# Patient Record
Sex: Female | Born: 1965 | Race: Black or African American | Hispanic: No | Smoking: Never smoker
Health system: Southern US, Community
[De-identification: ages and names within clinical notes are randomized; demographics above are authoritative.]

## PROBLEM LIST (undated history)

## (undated) DIAGNOSIS — M549 Dorsalgia, unspecified: Secondary | ICD-10-CM

## (undated) DIAGNOSIS — I10 Essential (primary) hypertension: Secondary | ICD-10-CM

## (undated) DIAGNOSIS — M25569 Pain in unspecified knee: Secondary | ICD-10-CM

## (undated) HISTORY — PX: ECTOPIC PREGNANCY SURGERY: SHX613

---

## 2008-11-22 ENCOUNTER — Emergency Department (HOSPITAL_COMMUNITY): Admission: EM | Admit: 2008-11-22 | Discharge: 2008-11-23 | Payer: Self-pay | Admitting: Emergency Medicine

## 2009-01-22 ENCOUNTER — Emergency Department (HOSPITAL_COMMUNITY): Admission: EM | Admit: 2009-01-22 | Discharge: 2009-01-23 | Payer: Self-pay | Admitting: Emergency Medicine

## 2009-03-12 ENCOUNTER — Emergency Department (HOSPITAL_COMMUNITY): Admission: EM | Admit: 2009-03-12 | Discharge: 2009-03-12 | Payer: Self-pay | Admitting: Emergency Medicine

## 2009-06-17 ENCOUNTER — Emergency Department (HOSPITAL_COMMUNITY): Admission: EM | Admit: 2009-06-17 | Discharge: 2009-06-17 | Payer: Self-pay | Admitting: Emergency Medicine

## 2011-07-17 ENCOUNTER — Emergency Department (HOSPITAL_COMMUNITY)
Admission: EM | Admit: 2011-07-17 | Discharge: 2011-07-17 | Disposition: A | Discharge status: Home / Self Care | Payer: Self-pay | Attending: Emergency Medicine | Admitting: Emergency Medicine

## 2011-07-17 DIAGNOSIS — R220 Localized swelling, mass and lump, head: Secondary | ICD-10-CM | POA: Insufficient documentation

## 2011-07-17 DIAGNOSIS — R221 Localized swelling, mass and lump, neck: Secondary | ICD-10-CM | POA: Insufficient documentation

## 2011-07-17 DIAGNOSIS — K047 Periapical abscess without sinus: Secondary | ICD-10-CM | POA: Insufficient documentation

## 2011-07-17 DIAGNOSIS — R6884 Jaw pain: Secondary | ICD-10-CM | POA: Insufficient documentation

## 2011-07-17 DIAGNOSIS — N760 Acute vaginitis: Secondary | ICD-10-CM | POA: Insufficient documentation

## 2011-07-17 DIAGNOSIS — I10 Essential (primary) hypertension: Secondary | ICD-10-CM | POA: Insufficient documentation

## 2011-07-17 DIAGNOSIS — A499 Bacterial infection, unspecified: Secondary | ICD-10-CM | POA: Insufficient documentation

## 2011-07-17 DIAGNOSIS — K029 Dental caries, unspecified: Secondary | ICD-10-CM | POA: Insufficient documentation

## 2011-07-17 DIAGNOSIS — B9689 Other specified bacterial agents as the cause of diseases classified elsewhere: Secondary | ICD-10-CM | POA: Insufficient documentation

## 2011-07-17 LAB — WET PREP, GENITAL

## 2011-07-18 LAB — GC/CHLAMYDIA PROBE AMP, GENITAL
Chlamydia, DNA Probe: NEGATIVE
GC Probe Amp, Genital: NEGATIVE

## 2012-05-05 ENCOUNTER — Encounter (HOSPITAL_COMMUNITY): Payer: Self-pay | Admitting: Emergency Medicine

## 2012-05-05 ENCOUNTER — Emergency Department (HOSPITAL_COMMUNITY)
Admission: EM | Admit: 2012-05-05 | Discharge: 2012-05-05 | Disposition: A | Discharge status: Home / Self Care | Payer: Federal, State, Local not specified - PPO | Attending: Emergency Medicine | Admitting: Emergency Medicine

## 2012-05-05 DIAGNOSIS — H5789 Other specified disorders of eye and adnexa: Secondary | ICD-10-CM | POA: Insufficient documentation

## 2012-05-05 DIAGNOSIS — H04209 Unspecified epiphora, unspecified lacrimal gland: Secondary | ICD-10-CM

## 2012-05-05 DIAGNOSIS — I10 Essential (primary) hypertension: Secondary | ICD-10-CM

## 2012-05-05 MED ORDER — TOBRAMYCIN 0.3 % OP SOLN
2.0000 [drp] | OPHTHALMIC | Status: DC
  Administered 2012-05-05: 2 [drp] via OPHTHALMIC
  Filled 2012-05-05: qty 5

## 2012-05-05 NOTE — ED Notes (Signed)
Pt c/o eye drainage and crust when she wakes in the AM. With drainage continuing throughout the day.

## 2012-05-05 NOTE — ED Provider Notes (Addendum)
History     CSN: [id number]  Arrival date & time 05/05/12  3875   First MD Initiated Contact with Patient 05/05/12 [phone]      Chief Complaint  Patient presents with  . Eye Drainage    (Consider location/radiation/quality/duration/timing/severity/associated sxs/prior treatment) HPI Comments: Pt reports issue with eyes for several days to weeks, seems to get a little worse each day.  Primary symptoms are itching to both eyes, worse on left at medial corner.  Is a little red in AM's, but pt cannot tell me if it gets better through the day or not.  She denies fever, runny nose, sinus pressure, HA, visual change.  She admits tearing a little more at times and generally in AM there is crust to medial portion of eye.  She reports trying an OTC allergy eye drop with no relief.  She tried a benadryl with no relief.  She has had allergies in the past, but not every year and has not taken any allegra or claritin or other allergy meds.  No sick contacts, no trauma.  Pt reports years ago, she felt similar, can't recall if she had red eyes or not, she tells me she has had pink eye and doesn't have those symptoms, however was given some kind of eye drops, doesn't recall exactly but names tobradex or tobramycin by name as possibilities and thinks that helped her.    The history is provided by the patient.    History reviewed. No pertinent past medical history.  History reviewed. No pertinent past surgical history.  History reviewed. No pertinent family history.  History  Substance Use Topics  . Smoking status: Not on file  . Smokeless tobacco: Not on file  . Alcohol Use: Not on file    OB History    Grav Para Term Preterm Abortions TAB SAB Ect Mult Living                  Review of Systems  Constitutional: Negative for fever and chills.  HENT: Negative for congestion and sinus pressure.   Eyes: Positive for discharge, redness and itching. Negative for photophobia, pain and visual  disturbance.  Neurological: Negative for headaches.    Allergies  Biaxin and Iodine  Home Medications   Current Outpatient Rx  Name Route Sig Dispense Refill  . CLONIDINE HCL 0.1 MG PO TABS Oral Take 0.1 mg by mouth 2 (two) times daily.    Marland Kitchen LISINOPRIL 40 MG PO TABS Oral Take 40 mg by mouth daily.      BP 200/105  Pulse 78  Temp 98.3 F (36.8 C) (Oral)  Resp 18  Ht 5\' 7"  (1.702 m)  SpO2 98%  Physical Exam  Nursing note and vitals reviewed. Constitutional: She appears well-developed.  HENT:  Head: Normocephalic and atraumatic.  Eyes: Conjunctivae and EOM are normal. Pupils are equal, round, and reactive to light. Right eye exhibits no chemosis, no exudate and no hordeolum. No foreign body present in the right eye. Left eye exhibits discharge. Left eye exhibits no chemosis, no exudate and no hordeolum. No foreign body present in the left eye. No scleral icterus.      ED Course  Procedures (including critical care time)  Labs Reviewed - No data to display No results found.   1. Eye tearing       MDM  I suspect possibly allergies, no URI symptoms.  No HA, change in vision, redness, dischargeOtherwise possibly clogged nasolacrimal ducts.  As I was discussing  possibility of needing ducts drained by ophthalmologist, pt became defensive and stated "I didn't come all the way to the emergency department to be told I need to follow up with an eye doctor."  I re-iterated and pointed out that she herself stated she didn't have symptoms of "pink eye" with redness, discharge, photophobia.   pt seems insistent  that her expectations are for some kind of unknown eye drops and not following up with an ophthalmologist.  Regardless, she will be referred to one and she will try tobramycin drops. She does not wish to pursue continuing OTC allergy drops that I recommend because they didn't work before.  I also suggested trying claritin or zyrtec, however she didn't answer me to this  recommendation.  She did curtly respond that she would try the eye drops and acknowledged that I would be referring her to an optho and she would likely need follow up since I didn't think abx eye drops would resolve this.  She is told that steroid drops are not given emergently without opthalmology request and no need for emergent consultation is indicated at this time.     Her BP is quite high, she takes clonidine and lisinopril normally.  She has no complaint of blurred vision, HA, CP, SOB, back pain and see no symptoms or signs of end organ failure from HTN.           Gavin Pound. Oletta Lamas, MD 05/05/12 9604  Gavin Pound. Oletta Lamas, MD 05/05/12 5409  Gavin Pound. Antoria Lanza, MD 05/05/12 1001

## 2012-05-27 ENCOUNTER — Emergency Department (HOSPITAL_COMMUNITY)
Admission: EM | Admit: 2012-05-27 | Discharge: 2012-05-27 | Disposition: A | Discharge status: Home / Self Care | Payer: Federal, State, Local not specified - PPO | Attending: Emergency Medicine | Admitting: Emergency Medicine

## 2012-05-27 ENCOUNTER — Emergency Department (HOSPITAL_COMMUNITY): Payer: Federal, State, Local not specified - PPO

## 2012-05-27 ENCOUNTER — Encounter (HOSPITAL_COMMUNITY): Payer: Self-pay

## 2012-05-27 DIAGNOSIS — M5430 Sciatica, unspecified side: Secondary | ICD-10-CM | POA: Insufficient documentation

## 2012-05-27 DIAGNOSIS — I10 Essential (primary) hypertension: Secondary | ICD-10-CM | POA: Insufficient documentation

## 2012-05-27 DIAGNOSIS — IMO0002 Reserved for concepts with insufficient information to code with codable children: Secondary | ICD-10-CM | POA: Insufficient documentation

## 2012-05-27 DIAGNOSIS — M1710 Unilateral primary osteoarthritis, unspecified knee: Secondary | ICD-10-CM | POA: Insufficient documentation

## 2012-05-27 HISTORY — DX: Essential (primary) hypertension: I10

## 2012-05-27 HISTORY — DX: Dorsalgia, unspecified: M54.9

## 2012-05-27 MED ORDER — IBUPROFEN 600 MG PO TABS: 600.0000 mg | ORAL_TABLET | Freq: Four times a day (QID) | ORAL | Status: DC | PRN

## 2012-05-27 MED ORDER — HYDROMORPHONE HCL PF 1 MG/ML IJ SOLN
2.0000 mg | Freq: Once | INTRAMUSCULAR | Status: DC
  Filled 2012-05-27 (×2): qty 1

## 2012-05-27 MED ORDER — CYCLOBENZAPRINE HCL 5 MG PO TABS: 5.0000 mg | ORAL_TABLET | Freq: Three times a day (TID) | ORAL | Status: DC | PRN

## 2012-05-27 MED ORDER — OXYCODONE-ACETAMINOPHEN 5-325 MG PO TABS: 1.0000 | ORAL_TABLET | Freq: Four times a day (QID) | ORAL | Status: DC | PRN

## 2012-05-27 MED ORDER — DIAZEPAM 5 MG PO TABS
10.0000 mg | ORAL_TABLET | Freq: Once | ORAL | Status: AC
  Administered 2012-05-27: 10 mg via ORAL
  Filled 2012-05-27: qty 2
  Filled 2012-05-27: qty 1

## 2012-05-27 NOTE — ED Provider Notes (Signed)
History     CSN: [id number]  Arrival date & time 05/27/12  1152   First MD Initiated Contact with Patient 05/27/12 1303      Chief Complaint  Patient presents with  . Back Pain    (Consider location/radiation/quality/duration/timing/severity/associated sxs/prior treatment) HPI Pt with hx of sciatica presents with c/o pain in right lower back with raidation into right leg.  She states she was doing housework and the pain began yesterday.  Pain is aching and described as moderate.  No weakness of legs, no urinary retention, no incontinence of bowel or bladder.  She also states that she is having left knee pain.  States she has some amount of chronic left knee pain but this worsened yesterday as well when she was standing up from a chair.  States she has been told in the past that she has a torn meniscus.  Pain worse with movement and palpation. She has not tried anything at home for her pain other than ibuprofen earlier today.  There are no other associated systemic symptoms, there are no other alleviating or modifying factors.   Past Medical History  Diagnosis Date  . Back pain   . Hypertension     History reviewed. No pertinent past surgical history.  No family history on file.  History  Substance Use Topics  . Smoking status: Never Smoker   . Smokeless tobacco: Not on file  . Alcohol Use: No    OB History    Grav Para Term Preterm Abortions TAB SAB Ect Mult Living                  Review of Systems ROS reviewed and all otherwise negative except for mentioned in HPI  Allergies  Biaxin and Iodine  Home Medications   Current Outpatient Rx  Name Route Sig Dispense Refill  . CLONIDINE HCL 0.1 MG PO TABS Oral Take 0.1 mg by mouth 2 (two) times daily.    Marland Kitchen LISINOPRIL 40 MG PO TABS Oral Take 40 mg by mouth daily.    . CYCLOBENZAPRINE HCL 5 MG PO TABS Oral Take 1 tablet (5 mg total) by mouth 3 (three) times daily as needed for muscle spasms. 20 tablet 0  . IBUPROFEN 600  MG PO TABS Oral Take 1 tablet (600 mg total) by mouth every 6 (six) hours as needed for pain. 30 tablet 0  . OXYCODONE-ACETAMINOPHEN 5-325 MG PO TABS Oral Take 1-2 tablets by mouth every 6 (six) hours as needed for pain. 15 tablet 0    BP 163/88  Pulse 69  Temp 98.3 F (36.8 C) (Oral)  Resp 14  Ht 5\' 6"  (1.676 m)  Wt 301 lb (136.533 kg)  BMI 48.58 kg/m2  SpO2 100%  LMP 04/27/2012 Vitals reviewed Physical Exam Physical Examination: General appearance - alert, well appearing, and in no distress Mental status - alert, oriented to person, place, and time Mouth - mucous membranes moist, pharynx normal without lesions Chest - clear to auscultation, no wheezes, rales or rhonchi, symmetric air entry Heart - normal rate, regular rhythm, normal S1, S2, no murmurs, rubs, clicks or gallops Abdomen - soft, nontender, nondistended, no masses or organomegaly Back exam - full range of motion, no tenderness, palpable spasm or pain on motion Neurological - alert, oriented, normal speech, strength 5/5 in extremities x 4, sensation intact Musculoskeletal - some pain with ROM of left knee- no bony point tenderness, no medial or lateral joint line tenderness, otherwise  no joint tenderness, deformity  or swelling Extremities - peripheral pulses normal, no pedal edema, no clubbing or cyanosis Skin - normal coloration and turgor, no rashes Psych- normal mood and affect  ED Course  Procedures (including critical care time)  Labs Reviewed - No data to display Dg Lumbar Spine Complete  05/27/2012  *RADIOLOGY REPORT*  Clinical Data: Low back and left knee pain.  No known injury.  LUMBAR SPINE - COMPLETE 4+ VIEW  Comparison: 11/22/2008 and MR dated 06/17/2009.  Findings: Five non-rib bearing lumbar vertebrae.  Mild facet degenerative changes at the L5-S1 level.  Mild anterior spur formation at the T12-L1 level.  No fractures, pars defects or subluxations.  IMPRESSION: Mild degenerative changes.  Original Report  Authenticated By: Darrol Angel, M.D.   Dg Knee Complete 4 Views Left  05/27/2012  *RADIOLOGY REPORT*  Clinical Data: Left knee pain, no known injury  LEFT KNEE - COMPLETE 4+ VIEW  Comparison: None.  Findings:  No fracture or dislocation.  There is mild degenerative change of the medial compartment with joint space loss, subchondral sclerosis and osteophytosis.  There is minimal spurring of the tibial spines. No suprapatellar joint effusion.  No evidence of chondrocalcinosis.  IMPRESSION: Mild degenerative change of the medial compartment of the knee.  Original Report Authenticated By: Waynard Reeds, M.D.     1. Sciatica   2. Osteoarthritis of knee       MDM  Pt with low back pain c/w sciatica- no signs or symptoms of cauda equina.  Also left knee pain.  Xrays reassuring.  Pt treated with pain medications and muscle relaxers in the ED, she did have some improvement in her symptoms.  Discharged with strict return precautions.  Pt agreeable with plan.        Ethelda Chick, MD 05/28/12 [phone]

## 2012-05-27 NOTE — ED Notes (Signed)
Pt. Refused dilaudid injection.  MD notified.

## 2012-05-27 NOTE — ED Notes (Signed)
Pt states ran out of BP meds last taken was several weeks ago

## 2012-05-27 NOTE — ED Notes (Signed)
Pt in from home with lower back pain radiating to the right leg hx of sciatica states onset yesterday worsening today denies recent injury

## 2012-06-03 ENCOUNTER — Emergency Department (HOSPITAL_COMMUNITY)
Admission: EM | Admit: 2012-06-03 | Discharge: 2012-06-03 | Disposition: A | Discharge status: Home / Self Care | Payer: Federal, State, Local not specified - PPO | Attending: Emergency Medicine | Admitting: Emergency Medicine

## 2012-06-03 ENCOUNTER — Encounter (HOSPITAL_COMMUNITY): Payer: Self-pay | Admitting: *Deleted

## 2012-06-03 DIAGNOSIS — I10 Essential (primary) hypertension: Secondary | ICD-10-CM | POA: Insufficient documentation

## 2012-06-03 DIAGNOSIS — M25569 Pain in unspecified knee: Secondary | ICD-10-CM | POA: Insufficient documentation

## 2012-06-03 DIAGNOSIS — Z79899 Other long term (current) drug therapy: Secondary | ICD-10-CM | POA: Insufficient documentation

## 2012-06-03 DIAGNOSIS — M25529 Pain in unspecified elbow: Secondary | ICD-10-CM | POA: Insufficient documentation

## 2012-06-03 DIAGNOSIS — I160 Hypertensive urgency: Secondary | ICD-10-CM

## 2012-06-03 DIAGNOSIS — R209 Unspecified disturbances of skin sensation: Secondary | ICD-10-CM | POA: Insufficient documentation

## 2012-06-03 HISTORY — DX: Pain in unspecified knee: M25.569

## 2012-06-03 LAB — CBC WITH DIFFERENTIAL/PLATELET
Basophils Relative: 0 % (ref 0–1)
Eosinophils Absolute: 0.2 10*3/uL (ref 0.0–0.7)
Hemoglobin: 12.2 g/dL (ref 12.0–15.0)
MCH: 29 pg (ref 26.0–34.0)
MCHC: 32.5 g/dL (ref 30.0–36.0)
Monocytes Relative: 6 % (ref 3–12)
Neutrophils Relative %: 51 % (ref 43–77)

## 2012-06-03 LAB — BASIC METABOLIC PANEL
BUN: 14 mg/dL (ref 6–23)
Calcium: 9 mg/dL (ref 8.4–10.5)
Creatinine, Ser: 1 mg/dL (ref 0.50–1.10)
GFR calc non Af Amer: 67 mL/min — ABNORMAL LOW (ref >90)
Glucose, Bld: 77 mg/dL (ref 70–99)
Potassium: 3.4 mEq/L — ABNORMAL LOW (ref 3.5–5.1)

## 2012-06-03 LAB — URINALYSIS, ROUTINE W REFLEX MICROSCOPIC
Bilirubin Urine: NEGATIVE
Protein, ur: NEGATIVE mg/dL
Urobilinogen, UA: 1 mg/dL (ref 0.0–1.0)

## 2012-06-03 LAB — GLUCOSE, CAPILLARY: Glucose-Capillary: 99 mg/dL (ref 70–99)

## 2012-06-03 LAB — URINE MICROSCOPIC-ADD ON

## 2012-06-03 MED ORDER — CLONIDINE HCL 0.1 MG PO TABS: 0.1000 mg | ORAL_TABLET | Freq: Two times a day (BID) | ORAL | Status: DC

## 2012-06-03 MED ORDER — OXYCODONE-ACETAMINOPHEN 5-325 MG PO TABS: 1.0000 | ORAL_TABLET | Freq: Four times a day (QID) | ORAL | Status: AC | PRN

## 2012-06-03 MED ORDER — CLONIDINE HCL 0.1 MG PO TABS
0.1000 mg | ORAL_TABLET | Freq: Once | ORAL | Status: AC
  Administered 2012-06-03: 0.1 mg via ORAL
  Filled 2012-06-03: qty 1

## 2012-06-03 MED ORDER — IBUPROFEN 800 MG PO TABS
800.0000 mg | ORAL_TABLET | Freq: Once | ORAL | Status: AC
  Administered 2012-06-03: 800 mg via ORAL
  Filled 2012-06-03: qty 1

## 2012-06-03 MED ORDER — IBUPROFEN 800 MG PO TABS: 800.0000 mg | ORAL_TABLET | Freq: Three times a day (TID) | ORAL | Status: AC

## 2012-06-03 NOTE — ED Provider Notes (Signed)
History     CSN: [id number]  Arrival date & time 06/03/12  2035   First MD Initiated Contact with Patient 06/03/12 2057      Chief Complaint  Patient presents with  . Numbness    (Consider location/radiation/quality/duration/timing/severity/associated sxs/prior treatment) HPI The patient presents with concerns of left elbow pain.  She notes her symptoms began insidiously approximately 36 hours ago.  Since onset symptoms have been persistent.  She is sharp, burning sensation in her left elbow with radiation circumferentially distally and less so proximally.  She denies any concurrent fevers, chills, distal weakness or dysesthesia.  She also denies any chest pain, dyspnea, headache, nausea or vomiting. She states that she has not taken her blood pressure medication in some time due to monetary concerns.  The patient was evaluated for knee pain on the right side several weeks ago, notes that this is minimally present today. Past Medical History  Diagnosis Date  . Back pain   . Hypertension   . Knee pain     History reviewed. No pertinent past surgical history.  History reviewed. No pertinent family history.  History  Substance Use Topics  . Smoking status: Never Smoker   . Smokeless tobacco: Not on file  . Alcohol Use: No    OB History    Grav Para Term Preterm Abortions TAB SAB Ect Mult Living                  Review of Systems  Constitutional:       HPI  HENT:       HPI otherwise negative  Eyes: Negative.   Respiratory:       HPI, otherwise negative  Cardiovascular:       HPI, otherwise nmegative  Gastrointestinal: Negative for vomiting.  Genitourinary:       HPI, otherwise negative  Musculoskeletal:       HPI, otherwise negative  Skin: Negative.   Neurological: Negative for syncope.    Allergies  Biaxin and Iodine  Home Medications   Current Outpatient Rx  Name Route Sig Dispense Refill  . CYCLOBENZAPRINE HCL 5 MG PO TABS Oral Take 5 mg by mouth 3  (three) times daily as needed.    Marland Kitchen LISINOPRIL 40 MG PO TABS Oral Take 40 mg by mouth daily.    Marland Kitchen CLONIDINE HCL 0.1 MG PO TABS Oral Take 1 tablet (0.1 mg total) by mouth 2 (two) times daily. 60 tablet 0  . IBUPROFEN 800 MG PO TABS Oral Take 1 tablet (800 mg total) by mouth 3 (three) times daily. 9 tablet 0  . OXYCODONE-ACETAMINOPHEN 5-325 MG PO TABS Oral Take 1 tablet by mouth every 6 (six) hours as needed for pain. 12 tablet 0    BP 148/60  Pulse 84  Temp 99.3 F (37.4 C) (Oral)  Resp 21  Ht 5\' 6"  (1.676 m)  Wt 290 lb (131.543 kg)  BMI 46.81 kg/m2  SpO2 100%  LMP 04/27/2012  Physical Exam  Nursing note and vitals reviewed. Constitutional: She is oriented to person, place, and time. She appears well-developed and well-nourished. No distress.  HENT:  Head: Normocephalic and atraumatic.  Eyes: Conjunctivae and EOM are normal.  Cardiovascular: Normal rate and regular rhythm.   Pulmonary/Chest: Effort normal and breath sounds normal. No stridor. No respiratory distress.  Abdominal: She exhibits no distension.  Musculoskeletal: She exhibits no edema.       Left shoulder: Normal.       Left wrist: Normal.  Right knee: Normal.       Arms: Neurological: She is alert and oriented to person, place, and time. No cranial nerve deficit.  Skin: Skin is warm and dry.  Psychiatric: She has a normal mood and affect.    ED Course  Procedures (including critical care time)  Labs Reviewed  BASIC METABOLIC PANEL - Abnormal; Notable for the following:    Potassium 3.4 (*)     GFR calc non Af Amer 67 (*)     GFR calc Af Amer 78 (*)     All other components within normal limits  URINALYSIS, ROUTINE W REFLEX MICROSCOPIC - Abnormal; Notable for the following:    APPearance CLOUDY (*)     Specific Gravity, Urine 1.033 (*)     Hgb urine dipstick LARGE (*)     All other components within normal limits  URINE MICROSCOPIC-ADD ON - Abnormal; Notable for the following:    Squamous Epithelial  / LPF FEW (*)     Bacteria, UA FEW (*)     All other components within normal limits  GLUCOSE, CAPILLARY  CBC WITH DIFFERENTIAL   No results found.   1. Hypertensive urgency   2. Elbow pain    Cardiac 85 sinus rhythm normal Pulse oximetry 100% room air normal  Date: 06/03/2012  Rate: 86  Rhythm: normal sinus rhythm  QRS Axis: normal  Intervals: normal  ST/T Wave abnormalities: normal  Conduction Disutrbances:none  Narrative Interpretation:   Old EKG Reviewed: none available  NORMAL ECG   MDM  This obese female presents with ongoing left elbow pain.  Notably on exam the patient is hypertensive with a blood pressure of 208/121.  Given the patient's lack of medication use, there is concern for hypertensive urgency.  Patient's labs are relatively reassuring.  The patient's description of a prior episode of knee pain, now with left elbow pain isn't suggestive of gout.  The tenderness to palpation with minimal pressure, the absence of trauma is suggestive of this etiology.  However, tendinitis or bursitis or also considerations.  Absent any history of STDs, systemic illness, septic joint is less likely.  The patient also has no fever and no leukocytosis.  I discussed all findings with the patient.  She was discharged in stable condition after her blood pressure improved substantially, and her pain improved mildly.  She will follow up with an orthopedist for further evaluation and management of her elbow pain.  She was also counseled on the need for primary care followup    Gerhard Munch, MD 06/03/12 [phone]

## 2012-06-03 NOTE — ED Notes (Signed)
Pt was provided with list of orthopedic providers for [facility].

## 2012-06-03 NOTE — ED Notes (Signed)
Per patient, patient woke up yesterday with numbness and tingling to left arm that is still bothering her today. sts that she has hx of htn. Patient sts that her left elbow has been hurting her today as well, no hx of trauma to elbow.

## 2012-06-03 NOTE — ED Notes (Signed)
Pt watching TV and playing with cell phone. No distress noted. Nurse tech reported that pt does not believe that ice will help on elbow.

## 2012-06-03 NOTE — ED Notes (Addendum)
Pt was giving ice to apply on her elbow, pt stated the ice bag was making the numbness worse. Pt refuses the ice bag

## 2012-11-08 ENCOUNTER — Institutional Professional Consult (permissible substitution): Payer: Federal, State, Local not specified - PPO | Admitting: Family Medicine

## 2013-05-21 ENCOUNTER — Emergency Department (HOSPITAL_COMMUNITY)
Admission: EM | Admit: 2013-05-21 | Discharge: 2013-05-21 | Disposition: A | Discharge status: Home / Self Care | Payer: Federal, State, Local not specified - PPO | Attending: Emergency Medicine | Admitting: Emergency Medicine

## 2013-05-21 ENCOUNTER — Encounter (HOSPITAL_COMMUNITY): Payer: Self-pay | Admitting: Emergency Medicine

## 2013-05-21 ENCOUNTER — Emergency Department (HOSPITAL_COMMUNITY): Payer: Federal, State, Local not specified - PPO

## 2013-05-21 DIAGNOSIS — Z888 Allergy status to other drugs, medicaments and biological substances status: Secondary | ICD-10-CM | POA: Insufficient documentation

## 2013-05-21 DIAGNOSIS — R002 Palpitations: Secondary | ICD-10-CM | POA: Insufficient documentation

## 2013-05-21 DIAGNOSIS — Z3202 Encounter for pregnancy test, result negative: Secondary | ICD-10-CM | POA: Insufficient documentation

## 2013-05-21 DIAGNOSIS — R0789 Other chest pain: Secondary | ICD-10-CM | POA: Insufficient documentation

## 2013-05-21 DIAGNOSIS — I10 Essential (primary) hypertension: Secondary | ICD-10-CM | POA: Insufficient documentation

## 2013-05-21 DIAGNOSIS — Z79899 Other long term (current) drug therapy: Secondary | ICD-10-CM | POA: Insufficient documentation

## 2013-05-21 DIAGNOSIS — Z8739 Personal history of other diseases of the musculoskeletal system and connective tissue: Secondary | ICD-10-CM | POA: Insufficient documentation

## 2013-05-21 DIAGNOSIS — R079 Chest pain, unspecified: Secondary | ICD-10-CM

## 2013-05-21 LAB — POCT I-STAT TROPONIN I
Troponin i, poc: 0.01 ng/mL (ref 0.00–0.08)
Troponin i, poc: 0.01 ng/mL (ref 0.00–0.08)

## 2013-05-21 LAB — POCT PREGNANCY, URINE: Preg Test, Ur: NEGATIVE

## 2013-05-21 LAB — BASIC METABOLIC PANEL
BUN: 8 mg/dL (ref 6–23)
CO2: 26 mEq/L (ref 19–32)
Chloride: 106 mEq/L (ref 96–112)
Creatinine, Ser: 1.02 mg/dL (ref 0.50–1.10)
GFR calc Af Amer: 76 mL/min — ABNORMAL LOW (ref >90)
Potassium: 3.7 mEq/L (ref 3.5–5.1)

## 2013-05-21 LAB — CBC
HCT: 37.3 % (ref 36.0–46.0)
MCV: 87.4 fL (ref 78.0–100.0)
RBC: 4.27 MIL/uL (ref 3.87–5.11)
RDW: 15 % (ref 11.5–15.5)
WBC: 5.9 10*3/uL (ref 4.0–10.5)

## 2013-05-21 NOTE — ED Notes (Signed)
Pt refuses for iv insertion.

## 2013-05-21 NOTE — ED Notes (Signed)
Pt c/o SOB and chest tightness with palpitations starting while laying down today

## 2013-05-21 NOTE — ED Provider Notes (Signed)
History    CSN: [id number] Arrival date & time 05/21/13  1412  First MD Initiated Contact with Patient 05/21/13 1502     Chief Complaint  Patient presents with  . Chest Pain   (Consider location/radiation/quality/duration/timing/severity/associated sxs/prior Treatment) The history is provided by the patient.   Pt presents to the ED for episode of chest pain PTA.  Pt states earlier today she was lying on her couch when she got a sudden onset of left sided chest pressure and tightness radiating into her posterior neck, and palpitations.  States she immediately got up and changed positions but sx lasted another minute or two before resolving. Sx in total lasted < 5 mins.  Denies any SOB, diaphoresis, dizziness, weakness, numbness, or paresthesias of extremities during symptomatic period.  Prior episode of similar palpitations approx 1 month ago, but did not have the chest pressure then.  No personal or family hx of CAD or MI.  Pt does have HTN, takes clonidine.  Pt never smoked.  Never evaluated by cardiology before.  No recent travel or LE edema.  No hx of DVT or PE.  No recent strenuous activity to cause muscle strain.  Pt currently asx.  Past Medical History  Diagnosis Date  . Back pain   . Hypertension   . Knee pain    History reviewed. No pertinent past surgical history. History reviewed. No pertinent family history. History  Substance Use Topics  . Smoking status: Never Smoker   . Smokeless tobacco: Not on file  . Alcohol Use: Yes     Comment: occ   OB History   Grav Para Term Preterm Abortions TAB SAB Ect Mult Living                 Review of Systems  Cardiovascular: Positive for chest pain and palpitations.  All other systems reviewed and are negative.    Allergies  Biaxin and Iodine  Home Medications   Current Outpatient Rx  Name  Route  Sig  Dispense  Refill  . cloNIDine (CATAPRES) 0.1 MG tablet   Oral   Take 1 tablet (0.1 mg total) by mouth 2 (two) times  daily.   60 tablet   0   . lisinopril (PRINIVIL,ZESTRIL) 40 MG tablet   Oral   Take 40 mg by mouth daily.          BP 166/97  Pulse 84  Temp(Src) 98.5 F (36.9 C) (Oral)  Resp 18  SpO2 100%  LMP 05/04/2013  Physical Exam  Nursing note and vitals reviewed. Constitutional: She is oriented to person, place, and time. She appears well-developed and well-nourished. No distress.  HENT:  Head: Normocephalic and atraumatic.  Mouth/Throat: Oropharynx is clear and moist.  Eyes: Conjunctivae and EOM are normal. Pupils are equal, round, and reactive to light.  Neck: Normal range of motion. Neck supple.  Cardiovascular: Normal rate, regular rhythm, normal heart sounds and intact distal pulses.   Pulmonary/Chest: Effort normal and breath sounds normal. No respiratory distress. She has no wheezes.  Abdominal: Soft. Bowel sounds are normal. There is no tenderness. There is no guarding.  Musculoskeletal: Normal range of motion. She exhibits no edema.  Neurological: She is alert and oriented to person, place, and time. She has normal strength. She displays no tremor. No cranial nerve deficit or sensory deficit. She displays no seizure activity.  CN grossly intact, moves all extremities appropriately, no acute neuro deficits or facial droop appreciated  Skin: Skin is warm and  dry. She is not diaphoretic.  Psychiatric: She has a normal mood and affect.    ED Course  Procedures (including critical care time)   Date: 05/21/2013  Rate: 73  Rhythm: normal sinus rhythm  QRS Axis: normal  Intervals: normal  ST/T Wave abnormalities: nonspecific ST changes  Conduction Disutrbances:none  Narrative Interpretation: NSR, no STEMI  Old EKG Reviewed: changes noted   Labs Reviewed  BASIC METABOLIC PANEL - Abnormal; Notable for the following:    GFR calc non Af Amer 65 (*)    GFR calc Af Amer 76 (*)    All other components within normal limits  CBC  POCT I-STAT TROPONIN I  POCT PREGNANCY, URINE   POCT I-STAT TROPONIN I   Dg Chest 2 View  05/21/2013   *RADIOLOGY REPORT*  Clinical Data: Chest pain  CHEST - 2 VIEW  Comparison: None.  Findings: Lungs clear.  Heart size and pulmonary vascularity are normal.  No adenopathy.  No pneumothorax.  No bone lesions.  IMPRESSION: No abnormality noted.   Original Report Authenticated By: Bretta Bang, M.D.   1. Chest pain   2. Palpitations     MDM   EKG NSR, no acute ischemic changes.  Trop negative x2.  CXR clear.  Labs largely WNL.  Pt remains asx while in the ED and in NAD- talking on the phone with family members.  Pt with few RF- female gender and HTN but i doubt ACS, PE, dissection, or other vascular compromise. I feel that she can be safely d/c with cardiology FU and strict return precautions should sx recur.  Discussed plan with pt, she acknowledged understanding and agreed.  Return precautions advised.  Discussed pt with Dr. Blinda Leatherwood who agrees with plan.   Garlon Hatchet, PA-C 05/21/13 2350

## 2013-05-21 NOTE — ED Notes (Signed)
Patient denies any pain or questions upon discharge.

## 2013-05-24 NOTE — ED Provider Notes (Signed)
Medical screening examination/treatment/procedure(s) were performed by non-physician practitioner and as supervising physician I was immediately available for consultation/collaboration.   Gilda Crease, MD 05/24/13 [phone]

## 2013-08-06 ENCOUNTER — Emergency Department (HOSPITAL_COMMUNITY)
Admission: EM | Admit: 2013-08-06 | Discharge: 2013-08-06 | Disposition: A | Discharge status: Home / Self Care | Payer: Federal, State, Local not specified - PPO | Attending: Emergency Medicine | Admitting: Emergency Medicine

## 2013-08-06 ENCOUNTER — Encounter (HOSPITAL_COMMUNITY): Payer: Self-pay | Admitting: Emergency Medicine

## 2013-08-06 DIAGNOSIS — Z791 Long term (current) use of non-steroidal anti-inflammatories (NSAID): Secondary | ICD-10-CM | POA: Insufficient documentation

## 2013-08-06 DIAGNOSIS — K089 Disorder of teeth and supporting structures, unspecified: Secondary | ICD-10-CM | POA: Insufficient documentation

## 2013-08-06 DIAGNOSIS — Z79899 Other long term (current) drug therapy: Secondary | ICD-10-CM | POA: Insufficient documentation

## 2013-08-06 DIAGNOSIS — K044 Acute apical periodontitis of pulpal origin: Secondary | ICD-10-CM | POA: Insufficient documentation

## 2013-08-06 DIAGNOSIS — Z8739 Personal history of other diseases of the musculoskeletal system and connective tissue: Secondary | ICD-10-CM | POA: Insufficient documentation

## 2013-08-06 DIAGNOSIS — I10 Essential (primary) hypertension: Secondary | ICD-10-CM | POA: Insufficient documentation

## 2013-08-06 DIAGNOSIS — K047 Periapical abscess without sinus: Secondary | ICD-10-CM

## 2013-08-06 DIAGNOSIS — K0889 Other specified disorders of teeth and supporting structures: Secondary | ICD-10-CM

## 2013-08-06 MED ORDER — AMOXICILLIN 500 MG PO CAPS: 500.0000 mg | ORAL_CAPSULE | Freq: Three times a day (TID) | ORAL | Status: DC

## 2013-08-06 MED ORDER — HYDROCODONE-ACETAMINOPHEN 5-325 MG PO TABS: 1.0000 | ORAL_TABLET | Freq: Four times a day (QID) | ORAL | Status: DC | PRN

## 2013-08-06 NOTE — ED Provider Notes (Signed)
CSN: [id number]     Arrival date & time 08/06/13  1559 History  This chart was scribed for non-physician practitioner, Johnnette Gourd, PA, working with Donnetta Hutching, MD, by [facility] ED Scribe. This patient was seen in room TR09C/TR09C and the patient's care was started at 5:46 PM.   Chief Complaint  Patient presents with  . Dental Pain    The history is provided by the patient. No language interpreter was used.    HPI Comments: Katherine Browning is a 47 y.o. female who presents to the Emergency Department complaining of constant, radually worsnening, "6/10" left lower dental pain onset 2 days ago. She states that she believess she has an abscess in a tooth to that area. She states that she took Aleve 2 hours ago without relief. She denies fever, chills, difficulty swallowing or any other symptoms.   Past Medical History  Diagnosis Date  . Back pain   . Hypertension   . Knee pain    History reviewed. No pertinent past surgical history. History reviewed. No pertinent family history. History  Substance Use Topics  . Smoking status: Never Smoker   . Smokeless tobacco: Not on file  . Alcohol Use: Yes     Comment: occ   OB History   Grav Para Term Preterm Abortions TAB SAB Ect Mult Living                 Review of Systems  Constitutional: Negative for fever and chills.  HENT: Positive for dental problem. Negative for trouble swallowing.   All other systems reviewed and are negative.   Allergies  Biaxin and Iodine  Home Medications   Current Outpatient Rx  Name  Route  Sig  Dispense  Refill  . cloNIDine (CATAPRES) 0.1 MG tablet   Oral   Take 0.1 mg by mouth 2 (two) times daily.         . naproxen sodium (ANAPROX) 220 MG tablet   Oral   Take 220 mg by mouth 2 (two) times daily with a meal.          Triage Vitals: BP 170/102  Pulse 92  Temp(Src) 98.1 F (36.7 C) (Oral)  Resp 18  SpO2 99%  Physical Exam  Nursing note and vitals reviewed. Constitutional: She is  oriented to person, place, and time. She appears well-developed and well-nourished. No distress.  HENT:  Head: Normocephalic and atraumatic.  Mouth/Throat: Oropharynx is clear and moist.  Mild facial swelling to left lower mandible with tenderness. Poor dentition throughout, with multiple caries. Left lower 2nd and 3rd molars with surrounding erythema and edema, without abscess. No trismus. Left submandibular adenopathy.  Eyes: Conjunctivae and EOM are normal.  Neck: Normal range of motion. Neck supple.  Cardiovascular: Normal rate, regular rhythm and normal heart sounds.   Pulmonary/Chest: Effort normal and breath sounds normal. No respiratory distress.  Musculoskeletal: Normal range of motion. She exhibits no edema.  Neurological: She is alert and oriented to person, place, and time. No sensory deficit.  Skin: Skin is warm and dry.  Psychiatric: She has a normal mood and affect. Her behavior is normal.    ED Course  Procedures (including critical care time)  DIAGNOSTIC STUDIES: Oxygen Saturation is 99% on RA, normal by my interpretation.    COORDINATION OF CARE: 5:54 PM- Pt advised of plan for discharge with prescription for amoxicillin and Norco and ptt agrees with plan.   Labs Review Labs Reviewed - No data to display Imaging Review No  results found.  MDM   1. Pain, dental   2. Dental infection     Dental pain associated with dental infection. No evidence of dental abscess. Patient is afebrile, non toxic appearing and swallowing secretions well. I gave patient referral to dentist and stressed the importance of dental follow up for ultimate management of dental pain. I will also give amoxicillin and pain control. Patient voices understanding and is agreeable to plan.   I personally performed the services described in this documentation, which was scribed in my presence. The recorded information has been reviewed and is accurate.    Trevor Mace, PA-C 08/06/13 1757

## 2013-08-06 NOTE — ED Notes (Signed)
Pt c/o left lower dental pain x 2 days 

## 2013-08-06 NOTE — ED Notes (Signed)
Pt comfortable with d/c and f/u instructions. Prescriptions x2. 

## 2013-08-06 NOTE — ED Provider Notes (Signed)
Medical screening examination/treatment/procedure(s) were performed by non-physician practitioner and as supervising physician I was immediately available for consultation/collaboration.  Jaylene Arrowood, MD 08/06/13 2229 

## 2013-10-27 LAB — HM PAP SMEAR: HM PAP: NORMAL

## 2014-03-13 ENCOUNTER — Other Ambulatory Visit: Payer: Self-pay | Admitting: Specialist

## 2014-03-13 DIAGNOSIS — M545 Low back pain, unspecified: Secondary | ICD-10-CM

## 2014-03-19 ENCOUNTER — Ambulatory Visit
Admission: RE | Admit: 2014-03-19 | Discharge: 2014-03-19 | Disposition: A | Discharge status: Home / Self Care | Payer: Federal, State, Local not specified - PPO | Source: Ambulatory Visit | Attending: Specialist | Admitting: Specialist

## 2014-03-19 DIAGNOSIS — M545 Low back pain, unspecified: Secondary | ICD-10-CM

## 2014-05-05 ENCOUNTER — Ambulatory Visit: Payer: Federal, State, Local not specified - PPO | Admitting: Dietician

## 2014-06-18 ENCOUNTER — Encounter: Payer: Federal, State, Local not specified - PPO | Attending: Specialist | Admitting: Dietician

## 2014-06-18 ENCOUNTER — Encounter: Payer: Self-pay | Admitting: Dietician

## 2014-06-18 VITALS — Ht 65.0 in | Wt 321.0 lb

## 2014-06-18 DIAGNOSIS — Z6841 Body Mass Index (BMI) 40.0 and over, adult: Secondary | ICD-10-CM | POA: Insufficient documentation

## 2014-06-18 DIAGNOSIS — Z713 Dietary counseling and surveillance: Secondary | ICD-10-CM | POA: Diagnosis not present

## 2014-06-18 DIAGNOSIS — E669 Obesity, unspecified: Secondary | ICD-10-CM | POA: Insufficient documentation

## 2014-06-18 NOTE — Patient Instructions (Signed)
Think about talking to a counselor about emotional eating.  Katherine Browning [phone] Try to add vegetables to lunch and dinner meals.  Try baby spinach to have as a salad to to cook. Aim to drink mostly water or sparkling water instead of drinking juice. Limit starches to a quarter of your plate. Have protein with carbohydrates for meals and snacks. Try to eat something within an hour of waking up. Look to add more physical activity.  [facility]  Try to do bike or walking 10 minutes 3 x week.

## 2014-06-18 NOTE — Progress Notes (Signed)
Medical Nutrition Therapy:  Appt start time: 0800 end time:  0925.   Assessment:  Primary concerns today: Katherine Browning is here today since she is trying to lose to weight. Referred from orthopedic doctor since she has back and knee pain. Weight today is highest it has ever been. Gained most of weight in past 3 years (almost 70 lbs). Does not feel like she eats a lot of food and is not sure why she is gaining weight. Has dieted in the past successfully but weight is not going down. Not able to work out much because of pain in knees, ankles, and back.    Retired from [employer] 6 years ago since she was unable to perform duties d/t pain. Had gone through a "bad breakup" which caused her to eat more and gain weight. Has arthritis so pain in worse with cloudy weather. Tends to eat more in colder weather and doesn't get out of the house as much in cold weather.   Trying to eat 3 meals a day and healthy snacks and weight is going "nowhere" and has been at this weight for 2-3 years. States that doctor would like her to consider back surgery but she is not sure she wants to do that. States that she can be an "emotional eater". Not on a regular schedule for mealtimes. Lives with her 46 year old. Cooks sometimes, but will eat out 2-5 x week. Has to make dinner for more than just her so will make fried food for kids. Stress has been "ok" for awhile but has been more stressed lately since they don't have money to send her daughter to college. Moved to the area in 2009 and was feeling homesick. Had seen a counselor in the past since she had feelings of depression after her husband passed away several years ago.   Preferred Learning Style:   No preference indicated   Learning Readiness:   Ready  MEDICATIONS: see list   DIETARY INTAKE:  Avoided foods include: "not good with vegetables", beef, pork  24-hr recall:  B ( AM): muffin and coffee with cream and sugar or cereal (Captain Crunch Berries) and reduced  fat milk  Snk ( AM): none  L ( PM): chicken salad with regular mayonnaise and white bread with apple juice Snk ( PM): plum D ( PM): 2 hot dogs with apple juice  Snk ( PM): none Beverages: coffee, apple juice, water  Usual physical activity: none  Estimated energy needs: 1800 calories 200 g carbohydrates 135 g protein 50 g fat  Progress Towards Goal(s):  In progress.   Nutritional Diagnosis:  [place]-3.3 Overweight/obesity As related to hx of emotional eating and large portion sizes.  As evidenced by BMI of 53.4.    Intervention:  Nutrition counseling provided. Plan: Think about talking to a counselor about emotional eating.  Marco Collie [phone] Try to add vegetables to lunch and dinner meals.  Try baby spinach to have as a salad to to cook. Aim to drink mostly water or sparkling water instead of drinking juice. Limit starches to a quarter of your plate. Have protein with carbohydrates for meals and snacks. Try to eat something within an hour of waking up. Look to add more physical activity.  [facility]  Try to do bike or walking 10 minutes 3 x week.    Teaching Method Utilized:  Visual Auditory Hands on  Handouts given during visit include:  Yellow Card  MyPlate Handout  15 g CHO Snacks  Barriers to learning/adherence to lifestyle change: chronic pain, stress  Demonstrated degree of understanding via:  Teach Back   Monitoring/Evaluation:  Dietary intake, exercise, and body weight in 6 week(s).

## 2014-07-24 ENCOUNTER — Encounter (HOSPITAL_COMMUNITY): Payer: Self-pay | Admitting: Emergency Medicine

## 2014-07-24 ENCOUNTER — Emergency Department (HOSPITAL_COMMUNITY)
Admission: EM | Admit: 2014-07-24 | Discharge: 2014-07-25 | Disposition: A | Discharge status: Home / Self Care | Payer: Federal, State, Local not specified - PPO | Attending: Emergency Medicine | Admitting: Emergency Medicine

## 2014-07-24 DIAGNOSIS — I10 Essential (primary) hypertension: Secondary | ICD-10-CM | POA: Diagnosis not present

## 2014-07-24 DIAGNOSIS — IMO0002 Reserved for concepts with insufficient information to code with codable children: Secondary | ICD-10-CM | POA: Diagnosis not present

## 2014-07-24 DIAGNOSIS — M79609 Pain in unspecified limb: Secondary | ICD-10-CM | POA: Insufficient documentation

## 2014-07-24 DIAGNOSIS — M5416 Radiculopathy, lumbar region: Secondary | ICD-10-CM

## 2014-07-24 DIAGNOSIS — Z79899 Other long term (current) drug therapy: Secondary | ICD-10-CM | POA: Diagnosis not present

## 2014-07-24 MED ORDER — DIAZEPAM 5 MG PO TABS: 2.5000 mg | ORAL_TABLET | Freq: Four times a day (QID) | ORAL | Status: DC | PRN

## 2014-07-24 MED ORDER — DEXAMETHASONE 4 MG PO TABS
8.0000 mg | ORAL_TABLET | Freq: Once | ORAL | Status: AC
  Administered 2014-07-25: 8 mg via ORAL
  Filled 2014-07-24: qty 2

## 2014-07-24 MED ORDER — DEXAMETHASONE 4 MG PO TABS: 4.0000 mg | ORAL_TABLET | Freq: Two times a day (BID) | ORAL | Status: DC

## 2014-07-24 MED ORDER — IBUPROFEN 600 MG PO TABS: 600.0000 mg | ORAL_TABLET | Freq: Four times a day (QID) | ORAL | Status: DC | PRN

## 2014-07-24 MED ORDER — IBUPROFEN 200 MG PO TABS
600.0000 mg | ORAL_TABLET | Freq: Once | ORAL | Status: AC
  Administered 2014-07-25: 600 mg via ORAL
  Filled 2014-07-24: qty 3

## 2014-07-24 MED ORDER — HYDROCODONE-ACETAMINOPHEN 5-325 MG PO TABS: 1.0000 | ORAL_TABLET | ORAL | Status: DC | PRN

## 2014-07-24 NOTE — ED Notes (Signed)
Pt complains of pain going from her buttocks down her leg for three weeks

## 2014-07-24 NOTE — Discharge Instructions (Signed)
°Lumbosacral Radiculopathy °Lumbosacral radiculopathy is a pinched nerve or nerves in the low back (lumbosacral area). When this happens you may have weakness in your legs and may not be able to stand on your toes. You may have pain going down into your legs. There may be difficulties with walking normally. There are many causes of this problem. Sometimes this may happen from an injury, or simply from arthritis or boney problems. It may also be caused by other illnesses such as diabetes. If there is no improvement after treatment, further studies may be done to find the exact cause. °DIAGNOSIS  °X-rays may be needed if the problems become long standing. Electromyograms may be done. This study is one in which the working of nerves and muscles is studied. °HOME CARE INSTRUCTIONS  °· Applications of ice packs may be helpful. Ice can be used in a plastic bag with a towel around it to prevent frostbite to skin. This may be used every 2 hours for 20 to 30 minutes, or as needed, while awake, or as directed by your caregiver. °· Only take over-the-counter or prescription medicines for pain, discomfort, or fever as directed by your caregiver. °· If physical therapy was prescribed, follow your caregiver's directions. °SEEK IMMEDIATE MEDICAL CARE IF:  °· You have pain not controlled with medications. °· You seem to be getting worse rather than better. °· You develop increasing weakness in your legs. °· You develop loss of bowel or bladder control. °· You have difficulty with walking or balance, or develop clumsiness in the use of your legs. °· You have a fever. °MAKE SURE YOU:  °· Understand these instructions. °· Will watch your condition. °· Will get help right away if you are not doing well or get worse. °Document Released: 11/07/2005 Document Revised: 01/30/2012 Document Reviewed: 06/27/2008 °ExitCare® Patient Information ©2015 [employer]. This information is not intended to replace advice given to you by your health  care provider. Make sure you discuss any questions you have with your health care provider. ° ° ° ° °Emergency Department Resource Guide °1) Find a Doctor and Pay Out of Pocket °Although you won't have to find out who is covered by your insurance plan, it is a good idea to ask around and get recommendations. You will then need to call the office and see if the doctor you have chosen will accept you as a new patient and what types of options they offer for patients who are self-pay. Some doctors offer discounts or will set up payment plans for their patients who do not have insurance, but you will need to ask so you aren't surprised when you get to your appointment. ° °2) Contact Your Local Health Department °Not all health departments have doctors that can see patients for sick visits, but many do, so it is worth a call to see if yours does. If you don't know where your local health department is, you can check in your phone book. The CDC also has a tool to help you locate your state's health department, and many state websites also have listings of all of their local health departments. ° °3) Find a Walk-in Clinic °If your illness is not likely to be very severe or complicated, you may want to try a walk in clinic. These are popping up all over the country in pharmacies, drugstores, and shopping centers. They're usually staffed by nurse practitioners or physician assistants that have been trained to treat common illnesses and complaints. They're usually fairly   quick and inexpensive. However, if you have serious medical issues or chronic medical problems, these are probably not your best option. ° °No Primary Care Doctor: °- Call Health Connect at  832-8000 - they can help you locate a primary care doctor that  accepts your insurance, provides certain services, etc. °- Physician Referral Service- 1-800-533-3463 ° °Chronic Pain Problems: °Organization         Address  Phone   Notes  °[facility]   (336) 297-2271 Patients need to be referred by their primary care doctor.  ° °Medication Assistance: °Organization         Address  Phone   Notes  °[place] [facility] [address] °[place], [place] [address] (336) 641-8030 --Must be a resident of [place] °-- Must have NO insurance coverage whatsoever (no Medicaid/ Medicare, etc.) °-- The pt. MUST have a primary care doctor that directs their care regularly and follows them in the community °  °[facility]  (866) 331-1348   °[employer] Way  (888) 892-1162   ° °Agencies that provide inexpensive medical care: °Organization         Address  Phone   Notes  °[facility]  (336) 832-8035   °[facility]    (336) 832-7272   °[facility] [address] °[place], [place] [address] (336) 832-4777   °[facility] [address], °[place] (336) 271-4999   °[facility]    (336) 373-0678   °[facility]    (336) 272-1050   °[facility] ° [address], [place] Phone:  (336) 832-4444, Fax:  (336) 832-4440 Hours of Operation:  9 am - 6 pm, M-F.  Also accepts Medicaid/Medicare and self-pay.  °[facility] ° [address], [place] Phone: (336) 832-3150, Fax: (336) 832-3151. Hours of Operation:  8:30 am - 5:30 pm, M-F.  Also accepts Medicaid and self-pay.  °[facility] [place] [address], [place] Phone: (336) 878-6027   °[facility] [address], [place], [place] (336)723-1848, Ext. 123 Mondays & Thursdays: 7-9 AM.  First 15 patients are seen on a first come, first serve basis. °  ° °Medicaid-accepting [place] Providers: ° °Organization         Address  Phone   Notes  °[facility] [address], [place] (336) 641-2100 Also accepts self-pay patients.  °[facility] [address],  [place] ° (336) 856-9996   °[facility] [address], [place] (336) 288-8857   °[facility] [address] [place] [address], [place] (336) 299-7000   °Veita Bland [address], [place]  ° (336) 373-1557 Only accepts [place] Access Medicaid patients after they have their name applied to their card.  ° °Self-Pay (no insurance) in [place]: ° °Organization         Address  Phone   Notes  °Sickle Cell Patients, [facility] [address], [place] (336) 832-1970   °[facility] [address], [place] (336) 832-4400   °[facility] ° [address],  (336) 992-4800   °[facility]/Dr. Osei-Bonsu ° [address] [place] [address], [place] or [address], [place] (336) 841-8500 Phone number   for both [place] and [place] locations is the same.  °[facility] [address], [place] (336) 299-0000   °[facility] [address] [place] [address], [place] or [address] (336) 852-7530 °(336) 878-2260   °[facility] [address], [place] (336) 350-1642, phone; (336) 294-5005, fax Sees patients 1st and 3rd Saturday of every month.  Must not qualify for public or private insurance (i.e. Medicaid, Medicare, [place] Health Choice, Veterans' Benefits) • Household income should be no more than 200% of the poverty level •The clinic cannot treat you if you are pregnant or think you are pregnant • Sexually transmitted diseases are not treated at the clinic.  ° ° °Dental Care: °Organization         Address  Phone  Notes  °[facility] [address], [place] (336) 641-6152 Accepts children up to age 21 who are enrolled in Medicaid or [place] Health Choice; pregnant women with a Medicaid card; and children who have applied for Medicaid or [place] Health  Choice, but were declined, whose parents can pay a reduced fee at time of service.  °[facility] [place]  [address], [place] (336) 641-7733 Accepts children up to age 21 who are enrolled in Medicaid or [place] Health Choice; pregnant women with a Medicaid card; and children who have applied for Medicaid or [place] Health Choice, but were declined, whose parents can pay a reduced fee at time of service.  °[facility] ° [address], [place] (336) 641-4533 Patients are seen by appointment only. Walk-ins are not accepted. [facility] will see patients 18 years of age and older. °Monday - Tuesday (8am-5pm) °Most Wednesdays (8:30-5pm) °$30 per visit, cash only  °[facility] ° [address], [place] (336) 641-4533 Patients are seen by appointment only. Walk-ins are not accepted. [facility] will see patients 18 years of age and older. °One Wednesday Evening (Monthly: Volunteer Based).  $30 per visit, cash only  °[facility]  (919) 537-3737 for adults; Children under age 4, call Graduate Pediatric Dentistry at ([phone]) [phone]. Children aged 4-14, please call (919) [phone] to request a pediatric application. ° Dental services are provided in all areas of dental care including fillings, crowns and bridges, complete and partial dentures, implants, gum treatment, root canals, and extractions. Preventive care is also provided. Treatment is provided to both adults and children. °Patients are selected via a lottery and there is often a waiting list. °  °[facility] [address], °[place] ° (336) 763-8833 [email or web address] °  °[facility] [address], [place], [place] (336)723-1848, Ext. 123 Second and Fourth Thursday of each month, opens at 6:30 AM; Clinic ends at 9 AM.  Patients are seen on a first-come first-served basis, and a limited number are seen during each  clinic.  ° °[facility] ° [address], [place], [place] (336) 723-7904   Eligibility Requirements °You must have lived in [place], [place], or [place] counties for at least the last three months. °  You cannot be eligible for state or federal sponsored healthcare insurance, including Veterans Administration, Medicaid, or Medicare. °  You generally cannot be eligible for healthcare insurance through your employer.  °  How to apply: °Eligibility screenings are held every Tuesday and Wednesday afternoon from 1:00 pm until   4:00 pm. You do not need an appointment for the interview!  °[facility] [address], [place], [place] 336-631-2330   °[facility]  336-342-8273   °[facility]  336-703-3100   °[facility]  336-570-6415   ° °Behavioral Health Resources in the Community: °Intensive Outpatient Programs °Organization         Address  Phone  Notes  °[facility] [address], [place], [place] 336-878-6098   °[facility] [address], [place], [place] 336-832-9800   °[facility] [address], [place], [place] ° 336-882-2125   °[facility] [address],  °[place], [place] 1-800-853-5163 or 336-641-4981   °Substance Abuse Resources °Organization         Address  Phone  Notes  °[facility]  336-882-2125   °[facility]  336-784-9470   °[facility]  336-285-9073   °[facility]  336-845-3988   °Residential & Outpatient Substance Abuse Program  1-800-659-3381   °Psychological Services °Organization         Address  Phone  Notes  °[facility]  336- 832-9600   °[facility]  336- 378-7881   °[facility] [address], [place] 1-800-853-5163 or 336-641-4981   ° °Mobile Crisis Teams °Organization         Address  Phone  Notes  °[facility] Mobile  Crisis Care Unit  1-877-626-1772   °Assertive °[facility] ° [address] [place], [place] 336-834-9664   °Sharon DeEsch [address] °[place] 336-554-5454   ° °Self-Help/Support Groups °Organization         Address  Phone             Notes  °[facility] [place] - variety of support groups  336- 373-1402 Call for more information  °Narcotics Anonymous (NA), [facility] [address], °[place]  2 meetings at this location  ° °Residential Treatment Programs °Organization         Address  Phone  Notes  °[facility] [address],    °[place]  1-866-801-8205   °[facility] ° [address], [place], [place] 704-293-8524   °[facility] [address], [place] 336-845-3988 Admissions: 8am-3pm M-F  °[facility] [address]    °[place], [place] 336-841-1104   °[facility] [address], [place], [place] 336-379-7146   °[facility] [address]  °[place], [place] 336-285-9073   °[facility] - Intensive Outpatient [address], [place], [place] 336-852-3033   °[facility] [address]  °[place], [place] 1-877-615-2722 or 336-784-9470   °[facility] [address] [place], [place] 336-227-7417 Accepts Medicaid  °[facility] [address]  ° [place] 1-800-659-3381 Substance Abuse/Addiction Treatment  ° °[place] Behavioral Health Resources °Organization         Address  Phone  Notes  °[facility]  (888) 581-9988   °Julie Brannon, PhD [address] [place], [place]   (336) 349-5553 or (336) 951-0000   °[facility]   [address] °[place], [place] (336) 349-4454   °[facility] [address], [place],  (336) 342-8316 Insurance/Medicaid/sponsorship through [facility]  °[facility] [address]                                    [place], [place] (336) 342-8316 Therapy/tele-psych/case  °[facility] [address]  ° [place], [place] (336) 349-2233    °Dr. Arfeen  (336) 349-4544   °[facility] [place]  [employer] [place] [facility] 1) [address], [place] °2) [address], [place] °3)  [address], [place] (336) 349-3220 °(336) 342-7768 ° °(336) 342-8140   °[place] Child Abuse Hotline (336) 342-1394 or (336) 342-3537 (After Hours)    ° ° °

## 2014-07-24 NOTE — ED Provider Notes (Signed)
CSN: [id number]     Arrival date & time 07/24/14  2224 History   First MD Initiated Contact with Patient 07/24/14 2316     Chief Complaint  Patient presents with  . Leg Pain     (Consider location/radiation/quality/duration/timing/severity/associated sxs/prior Treatment) HPI  48yF with R lower back, L leg pain. Ongoing for the past 3 weeks. Denies any trauma. Pain radiates down her right lower extremity past the knee. Exacerbating with any movement. No fevers or chills. No numbness/tingling. She feels like her strength is fine. No acute urinary complaints. No hx of back surgery. Denies IVDU. No blood thinners.   Past Medical History  Diagnosis Date  . Back pain   . Hypertension   . Knee pain    Past Surgical History  Procedure Laterality Date  . Ectopic pregnancy surgery     Family History  Problem Relation Age of Onset  . Hypertension Other    History  Substance Use Topics  . Smoking status: Never Smoker   . Smokeless tobacco: Not on file  . Alcohol Use: Yes     Comment: occ   OB History   Grav Para Term Preterm Abortions TAB SAB Ect Mult Living                 Review of Systems  All systems reviewed and negative, other than as noted in HPI.   Allergies  Biaxin and Iodine  Home Medications   Prior to Admission medications   Medication Sig Start Date End Date Taking? Authorizing Provider  cloNIDine (CATAPRES) 0.1 MG tablet Take 0.1 mg by mouth 2 (two) times daily.   Yes Historical Provider, MD  cyclobenzaprine (FLEXERIL) 10 MG tablet Take 10 mg by mouth 3 (three) times daily as needed for muscle spasms.   Yes Historical Provider, MD  lisinopril (PRINIVIL,ZESTRIL) 10 MG tablet Take 10 mg by mouth daily.   Yes Historical Provider, MD  Menthol, Topical Analgesic, (BENGAY EX) Apply 1 application topically daily.   Yes Historical Provider, MD  oxyCODONE-acetaminophen (PERCOCET/ROXICET) 5-325 MG per tablet Take 1 tablet by mouth every 4 (four) hours as needed for  severe pain.   Yes Historical Provider, MD   BP 160/81  Pulse 89  Temp(Src) 98 F (36.7 C) (Oral)  Resp 18  SpO2 100% Physical Exam  Nursing note and vitals reviewed. Constitutional: She appears well-developed and well-nourished. No distress.  HENT:  Head: Normocephalic and atraumatic.  Eyes: Conjunctivae are normal. Right eye exhibits no discharge. Left eye exhibits no discharge.  Neck: Neck supple.  Cardiovascular: Normal rate, regular rhythm and normal heart sounds.  Exam reveals no gallop and no friction rub.   No murmur heard. Pulmonary/Chest: Effort normal and breath sounds normal. No respiratory distress.  Abdominal: Soft. She exhibits no distension. There is no tenderness.  Musculoskeletal: She exhibits tenderness. She exhibits no edema.  No midline spinal tenderness  Neurological: She is alert. She exhibits normal muscle tone. Coordination normal.  Tenderness along R buttock, posterior thigh. Strength 5/5 b/l LE. Sensation intact to light touch. Palpable DP pulses. Can ambulate although with antalgic gait.   Skin: Skin is warm and dry.  Psychiatric: She has a normal mood and affect. Her behavior is normal. Thought content normal.    ED Course  Procedures (including critical care time) Labs Review Labs Reviewed - No data to display  Imaging Review No results found.   EKG Interpretation None      MDM   Final diagnoses:  Lumbar  radiculopathy    48yF with symptoms consistent with lumbar radiculopathy. Nonfocal neuro exam. Plan symptomatic tx. Return precautions discussed. outpt FU otherwise.   MRI lumbar spine 02/2014: 1. Shallow right paracentral disc protrusion at L4-5 with potential  mild impingement on the right L5 nerve root in the lateral recess.  There has been desiccation and retraction of this disc protrusion  since the prior MRI.  2. Shallow broad-based left paracentral disc protrusion at L5-S1  possibly impinging on the left S1 nerve root. This was  more of a  broad-based disc protrusion on the prior study.     Raeford Razor, MD 07/31/14 [phone]

## 2014-07-30 ENCOUNTER — Ambulatory Visit: Payer: Federal, State, Local not specified - PPO | Admitting: Dietician

## 2014-10-13 ENCOUNTER — Ambulatory Visit: Payer: Federal, State, Local not specified - PPO | Admitting: Internal Medicine

## 2014-10-27 ENCOUNTER — Other Ambulatory Visit (INDEPENDENT_AMBULATORY_CARE_PROVIDER_SITE_OTHER): Payer: Federal, State, Local not specified - PPO

## 2014-10-27 ENCOUNTER — Ambulatory Visit (INDEPENDENT_AMBULATORY_CARE_PROVIDER_SITE_OTHER): Payer: Federal, State, Local not specified - PPO | Admitting: Internal Medicine

## 2014-10-27 ENCOUNTER — Encounter: Payer: Self-pay | Admitting: Internal Medicine

## 2014-10-27 VITALS — BP 130/78 | HR 81 | Temp 98.5°F | Resp 16 | Ht 65.0 in | Wt 334.0 lb

## 2014-10-27 DIAGNOSIS — Z0000 Encounter for general adult medical examination without abnormal findings: Secondary | ICD-10-CM | POA: Insufficient documentation

## 2014-10-27 DIAGNOSIS — I10 Essential (primary) hypertension: Secondary | ICD-10-CM | POA: Insufficient documentation

## 2014-10-27 DIAGNOSIS — M1712 Unilateral primary osteoarthritis, left knee: Secondary | ICD-10-CM

## 2014-10-27 DIAGNOSIS — L709 Acne, unspecified: Secondary | ICD-10-CM

## 2014-10-27 DIAGNOSIS — M25562 Pain in left knee: Secondary | ICD-10-CM

## 2014-10-27 LAB — COMPREHENSIVE METABOLIC PANEL
ALBUMIN: 3.8 g/dL (ref 3.5–5.2)
ALT: 12 U/L (ref 0–35)
AST: 15 U/L (ref 0–37)
Alkaline Phosphatase: 69 U/L (ref 39–117)
BUN: 17 mg/dL (ref 6–23)
CALCIUM: 9 mg/dL (ref 8.4–10.5)
CHLORIDE: 104 meq/L (ref 96–112)
CO2: 25 meq/L (ref 19–32)
Creatinine, Ser: 1 mg/dL (ref 0.4–1.2)
GFR: 79.23 mL/min (ref >60.00)
Glucose, Bld: 112 mg/dL — ABNORMAL HIGH (ref 70–99)
POTASSIUM: 4.1 meq/L (ref 3.5–5.1)
SODIUM: 137 meq/L (ref 135–145)
TOTAL PROTEIN: 7.7 g/dL (ref 6.0–8.3)
Total Bilirubin: 0.6 mg/dL (ref 0.2–1.2)

## 2014-10-27 LAB — CBC
HCT: 37.3 % (ref 36.0–46.0)
Hemoglobin: 12.1 g/dL (ref 12.0–15.0)
MCHC: 32.4 g/dL (ref 30.0–36.0)
MCV: 84.7 fl (ref 78.0–100.0)
Platelets: 259 10*3/uL (ref 150.0–400.0)
RBC: 4.41 Mil/uL (ref 3.87–5.11)
RDW: 16 % — AB (ref 11.5–15.5)
WBC: 6.3 10*3/uL (ref 4.0–10.5)

## 2014-10-27 LAB — LIPID PANEL
CHOL/HDL RATIO: 3
Cholesterol: 172 mg/dL (ref 0–200)
HDL: 51.5 mg/dL (ref >39.00)
LDL CALC: 110 mg/dL — AB (ref 0–99)
NonHDL: 120.5
TRIGLYCERIDES: 55 mg/dL (ref 0.0–149.0)
VLDL: 11 mg/dL (ref 0.0–40.0)

## 2014-10-27 LAB — TSH: TSH: 1.19 u[IU]/mL (ref 0.35–4.50)

## 2014-10-27 LAB — HEMOGLOBIN A1C: Hgb A1c MFr Bld: 6.2 % (ref 4.6–6.5)

## 2014-10-27 MED ORDER — SALICYLIC ACID 6 % EX GEL: Freq: Every day | CUTANEOUS | Status: DC

## 2014-10-27 NOTE — Progress Notes (Signed)
   Subjective:    Patient ID: Katherine Browning, female    DOB: Jul 08, 1966, 48 y.o.   MRN: [id number]  HPI The patient is a 16 showed female comes in today to establish care. She has had past medical history of knee and back pain, hypertension. She states that her blood pressure does well when it is under good control. 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 She previously was on worker's comp but this has ended. She is from [place] and used to go back and forth for her medical care however is now not able to travel to [place] and would like to establish care here. She continues to have knee pain especially with walking. It has given out on her several times. She does not notice anything that particularly aggravates the pain. Is not painful at rest. She started taking ibuprofen 600 mg 3 times a day and this has helped immensely. She is not clear if she has ever seen an orthopedic surgeon or has ever been told she may need knee replacement.   Review of Systems  Constitutional: Positive for activity change. Negative for fever, appetite change, fatigue and unexpected weight change.  HENT: Negative.   Respiratory: Negative for cough, chest tightness, shortness of breath and wheezing.   Cardiovascular: Positive for leg swelling. Negative for chest pain and palpitations.  Gastrointestinal: Negative for abdominal pain, diarrhea, constipation and abdominal distention.  Musculoskeletal: Positive for back pain, arthralgias and gait problem.  Skin: Negative.   Neurological: Negative.   Psychiatric/Behavioral: Negative.       Objective:   Physical Exam  Constitutional: She is oriented to person, place, and time. She appears well-developed and well-nourished.  Morbidly obese  HENT:  Head: Normocephalic and atraumatic.  Mouth/Throat: Oropharynx is clear and moist.  Eyes: EOM are normal.  Neck: Normal range of motion.  Cardiovascular:  Normal rate and regular rhythm.   Pulmonary/Chest: Effort normal and breath sounds normal. No respiratory distress. She has no wheezes. She has no rales.  Abdominal: Soft. Bowel sounds are normal. She exhibits no distension. There is no tenderness. There is no rebound.  Musculoskeletal: She exhibits tenderness.  Pain on the medial aspect of the knee right around the pes anserine bursa.  Neurological: She is alert and oriented to person, place, and time.  Skin: Skin is warm and dry.   Filed Vitals:   10/27/14 0912  BP: 130/78  Pulse: 81  Temp: 98.5 F (36.9 C)  TempSrc: Oral  Resp: 16  Height: 5\' 5"  (1.651 m)  Weight: 334 lb (151.501 kg)  SpO2: 95%      Assessment & Plan:

## 2014-10-27 NOTE — Progress Notes (Signed)
Pre visit review using our clinic review tool, if applicable. No additional management support is needed unless otherwise documented below in the visit note. 

## 2014-10-27 NOTE — Assessment & Plan Note (Signed)
Check CMP, hemoglobin A1c, TSH, lipid panel.

## 2014-10-27 NOTE — Assessment & Plan Note (Signed)
Patient seeks treatment for acne. On exam clinically mild case of acne. Will prescribe salicylic acid gel.

## 2014-10-27 NOTE — Assessment & Plan Note (Addendum)
Possibly be pes anserine bursitis. Encouraged her to continue using ibuprofen 600 mg 3 times a day. Per her request we'll send to physical therapy. Have also referred to orthopedic surgery. Unable to perform injection today due to hand injury. Have advised patient that she may be worthwhile to get a cane for better and safer ambulation. To be held in the left hand.

## 2014-10-27 NOTE — Patient Instructions (Signed)
We will have you start going to physical therapy. He should hear back about that this week. The other thing we'll do is send you to a knee doctor so that they can decide what needs to be done with your knee to help you walk better.  We are going to check some blood work today and will call you back with those results.  We will see back in about 6 months if things are going well. If you have any new problems or questions before then please feel free to call our office.  Knee Pain The knee is the complex joint between your thigh and your lower leg. It is made up of bones, tendons, ligaments, and cartilage. The bones that make up the knee are:  The femur in the thigh.  The tibia and fibula in the lower leg.  The patella or kneecap riding in the groove on the lower femur. CAUSES  Knee pain is a common complaint with many causes. A few of these causes are:  Injury, such as:  A ruptured ligament or tendon injury.  Torn cartilage.  Medical conditions, such as:  Gout  Arthritis  Infections  Overuse, over training, or overdoing a physical activity. Knee pain can be minor or severe. Knee pain can accompany debilitating injury. Minor knee problems often respond well to self-care measures or get well on their own. More serious injuries may need medical intervention or even surgery. SYMPTOMS The knee is complex. Symptoms of knee problems can vary widely. Some of the problems are:  Pain with movement and weight bearing.  Swelling and tenderness.  Buckling of the knee.  Inability to straighten or extend your knee.  Your knee locks and you cannot straighten it.  Warmth and redness with pain and fever.  Deformity or dislocation of the kneecap. DIAGNOSIS  Determining what is wrong may be very straight forward such as when there is an injury. It can also be challenging because of the complexity of the knee. Tests to make a diagnosis may include:  Your caregiver taking a history and  doing a physical exam.  Routine X-rays can be used to rule out other problems. X-rays will not reveal a cartilage tear. Some injuries of the knee can be diagnosed by:  Arthroscopy a surgical technique by which a small video camera is inserted through tiny incisions on the sides of the knee. This procedure is used to examine and repair internal knee joint problems. Tiny instruments can be used during arthroscopy to repair the torn knee cartilage (meniscus).  Arthrography is a radiology technique. A contrast liquid is directly injected into the knee joint. Internal structures of the knee joint then become visible on X-ray film.  An MRI scan is a non X-ray radiology procedure in which magnetic fields and a computer produce two- or three-dimensional images of the inside of the knee. Cartilage tears are often visible using an MRI scanner. MRI scans have largely replaced arthrography in diagnosing cartilage tears of the knee.  Blood work.  Examination of the fluid that helps to lubricate the knee joint (synovial fluid). This is done by taking a sample out using a needle and a syringe. TREATMENT The treatment of knee problems depends on the cause. Some of these treatments are:  Depending on the injury, proper casting, splinting, surgery, or physical therapy care will be needed.  Give yourself adequate recovery time. Do not overuse your joints. If you begin to get sore during workout routines, back off. Slow down  or do fewer repetitions.  For repetitive activities such as cycling or running, maintain your strength and nutrition.  Alternate muscle groups. For example, if you are a weight lifter, work the upper body on one day and the lower body the next.  Either tight or weak muscles do not give the proper support for your knee. Tight or weak muscles do not absorb the stress placed on the knee joint. Keep the muscles surrounding the knee strong.  Take care of mechanical problems.  If you have flat  feet, orthotics or special shoes may help. See your caregiver if you need help.  Arch supports, sometimes with wedges on the inner or outer aspect of the heel, can help. These can shift pressure away from the side of the knee most bothered by osteoarthritis.  A brace called an "unloader" brace also may be used to help ease the pressure on the most arthritic side of the knee.  If your caregiver has prescribed crutches, braces, wraps or ice, use as directed. The acronym for this is PRICE. This means protection, rest, ice, compression, and elevation.  Nonsteroidal anti-inflammatory drugs (NSAIDs), can help relieve pain. But if taken immediately after an injury, they may actually increase swelling. Take NSAIDs with food in your stomach. Stop them if you develop stomach problems. Do not take these if you have a history of ulcers, stomach pain, or bleeding from the bowel. Do not take without your caregiver's approval if you have problems with fluid retention, heart failure, or kidney problems.  For ongoing knee problems, physical therapy may be helpful.  Glucosamine and chondroitin are over-the-counter dietary supplements. Both may help relieve the pain of osteoarthritis in the knee. These medicines are different from the usual anti-inflammatory drugs. Glucosamine may decrease the rate of cartilage destruction.  Injections of a corticosteroid drug into your knee joint may help reduce the symptoms of an arthritis flare-up. They may provide pain relief that lasts a few months. You may have to wait a few months between injections. The injections do have a small increased risk of infection, water retention, and elevated blood sugar levels.  Hyaluronic acid injected into damaged joints may ease pain and provide lubrication. These injections may work by reducing inflammation. A series of shots may give relief for as long as 6 months.  Topical painkillers. Applying certain ointments to your skin may help  relieve the pain and stiffness of osteoarthritis. Ask your pharmacist for suggestions. Many over the-counter products are approved for temporary relief of arthritis pain.  In some countries, doctors often prescribe topical NSAIDs for relief of chronic conditions such as arthritis and tendinitis. A review of treatment with NSAID creams found that they worked as well as oral medications but without the serious side effects. PREVENTION  Maintain a healthy weight. Extra pounds put more strain on your joints.  Get strong, stay limber. Weak muscles are a common cause of knee injuries. Stretching is important. Include flexibility exercises in your workouts.  Be smart about exercise. If you have osteoarthritis, chronic knee pain or recurring injuries, you may need to change the way you exercise. This does not mean you have to stop being active. If your knees ache after jogging or playing basketball, consider switching to swimming, water aerobics, or other low-impact activities, at least for a few days a week. Sometimes limiting high-impact activities will provide relief.  Make sure your shoes fit well. Choose footwear that is right for your sport.  Protect your knees. Use the proper  gear for knee-sensitive activities. Use kneepads when playing volleyball or laying carpet. Buckle your seat belt every time you drive. Most shattered kneecaps occur in car accidents.  Rest when you are tired. SEEK MEDICAL CARE IF:  You have knee pain that is continual and does not seem to be getting better.  SEEK IMMEDIATE MEDICAL CARE IF:  Your knee joint feels hot to the touch and you have a high fever. MAKE SURE YOU:   Understand these instructions.  Will watch your condition.  Will get help right away if you are not doing well or get worse. Document Released: 09/04/2007 Document Revised: 01/30/2012 Document Reviewed: 09/04/2007 [facility] Patient Information 2015 [place], [place]. This information is not intended to  replace advice given to you by your health care provider. Make sure you discuss any questions you have with your health care provider.

## 2014-10-27 NOTE — Assessment & Plan Note (Signed)
Try to speak with patient today about the fact that her weight is likely contributing significantly to her many medical problems. She was not very receptive to this conversation. 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 We did talk about the need for weight loss. She feels that her diet is fairly good however she is not able to exercise with her knee.

## 2014-10-27 NOTE — Assessment & Plan Note (Signed)
Currently taking clonidine and lisinopril. She states that she often takes it however goes in streaks where she does not. Currently well controlled. May be worthwhile to adjust regimen in the future for better adherence. Check BMP today

## 2014-11-04 ENCOUNTER — Telehealth: Payer: Self-pay | Admitting: *Deleted

## 2014-11-04 ENCOUNTER — Ambulatory Visit: Payer: Federal, State, Local not specified - PPO | Attending: Internal Medicine

## 2014-11-04 DIAGNOSIS — M25562 Pain in left knee: Secondary | ICD-10-CM | POA: Diagnosis present

## 2014-11-04 DIAGNOSIS — M25662 Stiffness of left knee, not elsewhere classified: Secondary | ICD-10-CM | POA: Insufficient documentation

## 2014-11-04 DIAGNOSIS — R262 Difficulty in walking, not elsewhere classified: Secondary | ICD-10-CM | POA: Diagnosis not present

## 2014-11-04 NOTE — Patient Instructions (Signed)
  http://orth.exer.us/36   Copyright  VHI. All rights reserved.  ROM: Plantar / Dorsiflexion   With left leg relaxed, gently flex and extend ankle. Move through full range of motion. Avoid pain. Repeat ____ times per set. Do ____ sets per session. Do ____ sessions per day.  http://orth.exer.us/34   Also issued SLR supine and RT side lying 10 reps 2 x/day an Quad sets x10 5 sec hold 2x/day      Also asked pt to ice 2x/day for 15 min more medial aspect of  LT knee.

## 2014-11-04 NOTE — Telephone Encounter (Signed)
Pt demanded that she schedule for one visit only at the moment...td

## 2014-11-04 NOTE — Therapy (Addendum)
Outpatient Rehabilitation [facility] [address] [place], [place], [address] Phone: [phone]   Fax:  [phone]  Physical Therapy Evaluation  Patient Details  Name: Katherine Browning MRN: [id number] Date of Birth: 1966-07-05  Encounter Date: 11/04/2014      PT End of Session - 11/04/14 1317    Visit Number 1   Number of Visits 12   Date for PT Re-Evaluation 12/22/04   PT Start Time 8527   PT Stop Time 1331   PT Time Calculation (min) 56 min   Activity Tolerance Patient tolerated treatment well   Behavior During Therapy [facility] for tasks assessed/performed      Past Medical History  Diagnosis Date  . Back pain   . Hypertension   . Knee pain     Past Surgical History  Procedure Laterality Date  . Ectopic pregnancy surgery      LMP 10/25/2014  Visit Diagnosis:  Knee pain, acute, left - Plan: PT plan of care cert/re-cert  Stiffness of joint, lower leg, left - Plan: PT plan of care cert/re-cert  Pain in joint, lower leg, left - Plan: PT plan of care cert/re-cert  Difficulty walking - Plan: PT plan of care cert/re-cert      Subjective Assessment - 11/04/14 1243    Symptoms She reports increaed of chronic LT knee pain from september of this year. She had an injury 7 years ago at work and has had pain since then.    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 She did not have injury in Sept 2015. Pain incr during a rainy stretch. She is some better with iburophen.   Limitations Sitting   How long can you sit comfortably? 20 min   How long can you stand comfortably? not sure   How long can you walk comfortably? < a block   Diagnostic tests None   Patient Stated Goals To decrease pain. Return to gym. to ride bike and treadmill   Currently in Pain? Yes   Pain Score 4    Pain Location Knee   Pain Orientation Left   Pain Descriptors / Indicators Throbbing;Stabbing  Feels some thing is going to  break   Pain Type Chronic pain   Pain Onset More than a month ago   Pain Frequency Constant   Aggravating Factors  Bending and straighting knee, walking and standing   Pain Relieving Factors Medication   Effect of Pain on Daily Activities Limited ADL's   Multiple Pain Sites No          [facility] PT Assessment - 11/04/14 1252    Assessment   Medical Diagnosis LT knee pain   Onset Date --  07/2014 acute on chronic pain   Next MD Visit 04/2015   Prior Therapy NA   Precautions   Precautions None   Restrictions   Weight Bearing Restrictions No   Prior Function   Level of Independence Independent with basic ADLs   Vocation Other (comment)   Observation/Other Assessments   Observations Tender with lmedial knee gapping , minto none lateral and with drawer testing. She is reluctant to ahve the tests done .  Tender mostly at medial knee at hamstrings and gasroc.    AROM   Right Knee Extension 180   Right Knee Flexion 80   Left Knee Extension 158  Passively supine 180 degrees   Left Knee Flexion 100   Strength   Overall Strength --  Decreased  LT quad and hamstring 4+/5 with some pain   Ambulation/Gait   Ambulation/Gait --  Walks without device  WBAT with decr weight to LT knee            PT Education - 11/04/14 1316    Education provided Yes   Education Details QS , SLR, use of cold   Person(s) Educated Patient   Methods Explanation;Demonstration;Handout   Comprehension Verbalized understanding;Returned demonstration          PT Short Term Goals - 11/04/14 1321    PT SHORT TERM GOAL #1   Title independnet with inital HEP   Time 3   Period Weeks   Status New   PT SHORT TERM GOAL #2   Title improved active LT knee extension to -5 degrees   Time 3   Period Weeks   Status New   PT SHORT TERM GOAL #3   Title Report able to sit 60 min without incr pain   Time 3   Period Weeks   Status New   PT SHORT TERM GOAL #4   Title She will report she is able to stand 30 min  without incr pain   Time 3   Period Weeks   Status New   PT SHORT TERM GOAL #5   Title she will report able to wlk > 1 block   Time 3   Period Weeks   Status New          PT Long Term Goals - 11/04/14 1322    PT LONG TERM GOAL #1   Title She will be independnet with HEP issued as of last visit   Time 6   Period Weeks   Status New   PT LONG TERM GOAL #2   Title Her painwith decr 60% or mroe and be able to walk 2 blocks for community access   Time 6   Period Weeks   Status New   PT LONG TERM GOAL #3   Title Active Lt knee motion equa to RTR   Time 6   Period Weeks   Status New   PT LONG TERM GOAL #4   Title Return to using treadmill and bike for exercise at gym   Time 6   Period Weeks   Status New          Plan - 11/04/14 1318    Clinical Impression Statement She is quite tender medial aspect of LT knee . Range only slightly limited compared to LT and strength in mid range slightly weaker prob due to pain.    Pt will benefit from skilled therapeutic intervention in order to improve on the following deficits Pain;Decreased activity tolerance;Decreased range of motion;Decreased strength;Difficulty walking   Rehab Potential Good   PT Frequency 2x / week   PT Duration 6 weeks   PT Treatment/Interventions Cryotherapy;Ultrasound;Moist Heat;Electrical Stimulation;Therapeutic exercise;Patient/family education;Manual techniques;Passive range of motion;Dry needling   PT Next Visit Plan Modalities and steghtnenig, possible taping   PT Home Exercise Plan Cold and SLR, strength   Consulted and Agree with Plan of Care Patient          By signing I understand that I am ordering/authorizing the use of Iontophoresis using 4 mg/mL of dexamethasone as a component of this plan of care.                     Problem List Patient Active Problem List   Diagnosis Date Noted  . Morbid obesity 10/27/2014  .  Essential hypertension 10/27/2014  . Knee pain, left  10/27/2014  . Routine general medical examination at a health care facility 10/27/2014  . Acne 10/27/2014    Darrel Hoover PT 11/04/2014, 1:28 PM      PHYSICAL THERAPY DISCHARGE SUMMARY  Visits from Start of Care: Eval only  Current functional level related to goals / functional outcomes: Unknown. The plan was for 12 visits but she did not return.    Remaining deficits: Unknown   Education / Equipment: NA Plan:                                                    Patient goals were not met. Patient is being discharged due to not returning since the last visit.  ?????   Darrel Hoover, PT            01/20/15          10:06 AM

## 2015-04-13 ENCOUNTER — Other Ambulatory Visit: Payer: Self-pay | Admitting: Geriatric Medicine

## 2015-04-13 ENCOUNTER — Telehealth: Payer: Self-pay

## 2015-04-13 MED ORDER — LISINOPRIL 10 MG PO TABS: 10.0000 mg | ORAL_TABLET | Freq: Every day | ORAL | Status: DC

## 2015-04-13 NOTE — Telephone Encounter (Signed)
Patient requesting prescription for lisinopril (PRINIVIL,ZESTRIL) 10 MG tablet [[id number]] Pharmacy is [facility] on [address]. Her next appt is 6/7. I couldn't get her in any sooner

## 2015-04-13 NOTE — Telephone Encounter (Signed)
Sent to pharmacy and patient is aware.

## 2015-04-24 ENCOUNTER — Emergency Department (HOSPITAL_COMMUNITY)
Admission: EM | Admit: 2015-04-24 | Discharge: 2015-04-24 | Disposition: A | Discharge status: Home / Self Care | Payer: Federal, State, Local not specified - PPO | Attending: Emergency Medicine | Admitting: Emergency Medicine

## 2015-04-24 ENCOUNTER — Encounter (HOSPITAL_COMMUNITY): Payer: Self-pay

## 2015-04-24 DIAGNOSIS — I10 Essential (primary) hypertension: Secondary | ICD-10-CM | POA: Insufficient documentation

## 2015-04-24 DIAGNOSIS — H6503 Acute serous otitis media, bilateral: Secondary | ICD-10-CM | POA: Insufficient documentation

## 2015-04-24 DIAGNOSIS — Z79899 Other long term (current) drug therapy: Secondary | ICD-10-CM | POA: Insufficient documentation

## 2015-04-24 DIAGNOSIS — J029 Acute pharyngitis, unspecified: Secondary | ICD-10-CM | POA: Diagnosis not present

## 2015-04-24 MED ORDER — AMOXICILLIN 500 MG PO CAPS: 500.0000 mg | ORAL_CAPSULE | Freq: Three times a day (TID) | ORAL | Status: DC

## 2015-04-24 NOTE — Discharge Instructions (Signed)
Upper Respiratory Infection, Adult °An upper respiratory infection (URI) is also sometimes known as the common cold. The upper respiratory tract includes the nose, sinuses, throat, trachea, and bronchi. Bronchi are the airways leading to the lungs. Most people improve within 1 week, but symptoms can last up to 2 weeks. A residual cough may last even longer.  °CAUSES °Many different viruses can infect the tissues lining the upper respiratory tract. The tissues become irritated and inflamed and often become very moist. Mucus production is also common. A cold is contagious. You can easily spread the virus to others by oral contact. This includes kissing, sharing a glass, coughing, or sneezing. Touching your mouth or nose and then touching a surface, which is then touched by another person, can also spread the virus. °SYMPTOMS  °Symptoms typically develop 1 to 3 days after you come in contact with a cold virus. Symptoms vary from person to person. They may include: °· Runny nose. °· Sneezing. °· Nasal congestion. °· Sinus irritation. °· Sore throat. °· Loss of voice (laryngitis). °· Cough. °· Fatigue. °· Muscle aches. °· Loss of appetite. °· Headache. °· Low-grade fever. °DIAGNOSIS  °You might diagnose your own cold based on familiar symptoms, since most people get a cold 2 to 3 times a year. Your caregiver can confirm this based on your exam. Most importantly, your caregiver can check that your symptoms are not due to another disease such as strep throat, sinusitis, pneumonia, asthma, or epiglottitis. Blood tests, throat tests, and X-rays are not necessary to diagnose a common cold, but they may sometimes be helpful in excluding other more serious diseases. Your caregiver will decide if any further tests are required. °RISKS AND COMPLICATIONS  °You may be at risk for a more severe case of the common cold if you smoke cigarettes, have chronic heart disease (such as heart failure) or lung disease (such as asthma), or if  you have a weakened immune system. The very young and very old are also at risk for more serious infections. Bacterial sinusitis, middle ear infections, and bacterial pneumonia can complicate the common cold. The common cold can worsen asthma and chronic obstructive pulmonary disease (COPD). Sometimes, these complications can require emergency medical care and may be life-threatening. °PREVENTION  °The best way to protect against getting a cold is to practice good hygiene. Avoid oral or hand contact with people with cold symptoms. Wash your hands often if contact occurs. There is no clear evidence that vitamin C, vitamin E, echinacea, or exercise reduces the chance of developing a cold. However, it is always recommended to get plenty of rest and practice good nutrition. °TREATMENT  °Treatment is directed at relieving symptoms. There is no cure. Antibiotics are not effective, because the infection is caused by a virus, not by bacteria. Treatment may include: °· Increased fluid intake. Sports drinks offer valuable electrolytes, sugars, and fluids. °· Breathing heated mist or steam (vaporizer or shower). °· Eating chicken soup or other clear broths, and maintaining good nutrition. °· Getting plenty of rest. °· Using gargles or lozenges for comfort. °· Controlling fevers with ibuprofen or acetaminophen as directed by your caregiver. °· Increasing usage of your inhaler if you have asthma. °Zinc gel and zinc lozenges, taken in the first 24 hours of the common cold, can shorten the duration and lessen the severity of symptoms. Pain medicines may help with fever, muscle aches, and throat pain. A variety of non-prescription medicines are available to treat congestion and runny nose. Your caregiver   can make recommendations and may suggest nasal or lung inhalers for other symptoms.  HOME CARE INSTRUCTIONS   Only take over-the-counter or prescription medicines for pain, discomfort, or fever as directed by your  caregiver.  Use a warm mist humidifier or inhale steam from a shower to increase air moisture. This may keep secretions moist and make it easier to breathe.  Drink enough water and fluids to keep your urine clear or pale yellow.  Rest as needed.  Return to work when your temperature has returned to normal or as your caregiver advises. You may need to stay home longer to avoid infecting others. You can also use a face mask and careful hand washing to prevent spread of the virus. SEEK MEDICAL CARE IF:   After the first few days, you feel you are getting worse rather than better.  You need your caregiver's advice about medicines to control symptoms.  You develop chills, worsening shortness of breath, or brown or red sputum. These may be signs of pneumonia.  You develop yellow or brown nasal discharge or pain in the face, especially when you bend forward. These may be signs of sinusitis.  You develop a fever, swollen neck glands, pain with swallowing, or white areas in the back of your throat. These may be signs of strep throat. SEEK IMMEDIATE MEDICAL CARE IF:   You have a fever.  You develop severe or persistent headache, ear pain, sinus pain, or chest pain.  You develop wheezing, a prolonged cough, cough up blood, or have a change in your usual mucus (if you have chronic lung disease).  You develop sore muscles or a stiff neck. Document Released: 05/03/2001 Document Revised: 01/30/2012 Document Reviewed: 02/12/2014 [facility] Patient Information 2015 [place], [place]. This information is not intended to replace advice given to you by your health care provider. Make sure you discuss any questions you have with your health care provider. Otitis Media With Effusion Otitis media with effusion is the presence of fluid in the middle ear. This is a common problem in children, which often follows ear infections. It may be present for weeks or longer after the infection. Unlike an acute ear  infection, otitis media with effusion refers only to fluid behind the ear drum and not infection. Children with repeated ear and sinus infections and allergy problems are the most likely to get otitis media with effusion. CAUSES  The most frequent cause of the fluid buildup is dysfunction of the eustachian tubes. These are the tubes that drain fluid in the ears to the back of the nose (nasopharynx). SYMPTOMS   The main symptom of this condition is hearing loss. As a result, you or your child may:  Listen to the TV at a loud volume.  Not respond to questions.  Ask "what" often when spoken to.  Mistake or confuse one sound or word for another.  There may be a sensation of fullness or pressure but usually not pain. DIAGNOSIS   Your health care provider will diagnose this condition by examining you or your child's ears.  Your health care provider may test the pressure in you or your child's ear with a tympanometer.  A hearing test may be conducted if the problem persists. TREATMENT   Treatment depends on the duration and the effects of the effusion.  Antibiotics, decongestants, nose drops, and cortisone-type drugs (tablets or nasal spray) may not be helpful.  Children with persistent ear effusions may have delayed language or behavioral problems. Children at risk  for developmental delays in hearing, learning, and speech may require referral to a specialist earlier than children not at risk.  You or your child's health care provider may suggest a referral to an ear, nose, and throat surgeon for treatment. The following may help restore normal hearing:  Drainage of fluid.  Placement of ear tubes (tympanostomy tubes).  Removal of adenoids (adenoidectomy). HOME CARE INSTRUCTIONS   Avoid secondhand smoke.  Infants who are breastfed are less likely to have this condition.  Avoid feeding infants while they are lying flat.  Avoid known environmental allergens.  Avoid people who  are sick. SEEK MEDICAL CARE IF:   Hearing is not better in 3 months.  Hearing is worse.  Ear pain.  Drainage from the ear.  Dizziness. MAKE SURE YOU:   Understand these instructions.  Will watch your condition.  Will get help right away if you are not doing well or get worse. Document Released: 12/15/2004 Document Revised: 03/24/2014 Document Reviewed: 06/04/2013 [facility] Patient Information 2015 [place], [place]. This information is not intended to replace advice given to you by your health care provider. Make sure you discuss any questions you have with your health care provider.

## 2015-04-24 NOTE — ED Provider Notes (Signed)
CSN: [id number]     Arrival date & time 04/24/15  2108 History  This chart was scribed for Roxy Horseman, PA-C, working with Richardean Canal, MD by Chestine Spore, ED Scribe. The patient was seen in room TR05C/TR05C at 9:40 PM.    Chief Complaint  Patient presents with  . Sore Throat      The history is provided by the patient. No language interpreter was used.     Katherine Browning is a 49 y.o. female with a medical hx of HTN who presents to the Emergency Department complaining of sore throat onset 4 days. Pt reports that she is retired at this time. Pt is having associated symptoms of chills, nasal congestion, and ear pain. She denies fever and any other symptoms. Pt reports that she takes clonidine and lisinopril for her HTN. Denies sick contacts.  Past Medical History  Diagnosis Date  . Back pain   . Hypertension   . Knee pain    Past Surgical History  Procedure Laterality Date  . Ectopic pregnancy surgery     Family History  Problem Relation Age of Onset  . Hypertension Other    History  Substance Use Topics  . Smoking status: Never Smoker   . Smokeless tobacco: Not on file  . Alcohol Use: Yes     Comment: occ   OB History    No data available     Review of Systems  Constitutional: Positive for chills. Negative for fever.  HENT: Positive for congestion, ear pain, postnasal drip, rhinorrhea, sinus pressure, sneezing and sore throat.   Respiratory: Positive for cough. Negative for shortness of breath.   Cardiovascular: Negative for chest pain.  Gastrointestinal: Negative for nausea, vomiting, abdominal pain, diarrhea and constipation.  Genitourinary: Negative for dysuria.      Allergies  Biaxin and Iodine  Home Medications   Prior to Admission medications   Medication Sig Start Date End Date Taking? Authorizing Provider  cloNIDine (CATAPRES) 0.1 MG tablet Take 0.1 mg by mouth 2 (two) times daily.    Historical Provider, MD  ibuprofen (ADVIL,MOTRIN) 600 MG tablet  Take 1 tablet (600 mg total) by mouth every 6 (six) hours as needed. 07/24/14   Raeford Razor, MD  lisinopril (PRINIVIL,ZESTRIL) 10 MG tablet Take 1 tablet (10 mg total) by mouth daily. 04/13/15   Judie Bonus, MD  oxyCODONE-acetaminophen (PERCOCET/ROXICET) 5-325 MG per tablet Take 1 tablet by mouth every 4 (four) hours as needed for severe pain.    Historical Provider, MD  salicylic acid 6 % gel Apply topically daily. 10/27/14   Judie Bonus, MD   BP 143/87 mmHg  Pulse 86  Temp(Src) 98.8 F (37.1 C) (Oral)  Resp 16  SpO2 100% Physical Exam  Constitutional: She is oriented to person, place, and time. She appears well-developed and well-nourished. No distress.  HENT:  Head: Normocephalic and atraumatic.  Mild congestion seen behind right tympanic membrane with mild erythema, moderate congestion behind left tympanic membrane with moderate erythema, oropharynx is mildly erythematous, no tonsillar exudate or abscess  Eyes: EOM are normal.  Neck: Neck supple. No tracheal deviation present.  Cardiovascular: Normal rate.   Pulmonary/Chest: Effort normal. No respiratory distress. She has no wheezes. She has no rales. She exhibits no tenderness.  Clear to auscultation bilaterally  Abdominal: Soft. She exhibits no distension. There is no tenderness.  Musculoskeletal: Normal range of motion.  Neurological: She is alert and oriented to person, place, and time.  Skin: Skin is warm  and dry.  Psychiatric: She has a normal mood and affect. Her behavior is normal.  Nursing note and vitals reviewed.   ED Course  Procedures (including critical care time) DIAGNOSTIC STUDIES: Oxygen Saturation is 100% on RA, nl by my interpretation.    COORDINATION OF CARE: 9:43 PM-Discussed treatment plan which includes amoxicillin Rx, zyrtec, alternate ibuprofen and tylenol with pt at bedside and pt agreed to plan.     Labs Review Labs Reviewed - No data to display  Imaging Review No results  found.   EKG Interpretation None      MDM   Final diagnoses:  Bilateral acute serous otitis media, recurrence not specified  Sore throat    Patient with sore throat and OM.  Will treat with amoxicillin.  DC to home.  Return precautions given.  I personally performed the services described in this documentation, which was scribed in my presence. The recorded information has been reviewed and is accurate.      Roxy Horseman, PA-C 04/24/15 2149  Richardean Canal, MD 04/24/15 [id number]

## 2015-04-24 NOTE — ED Notes (Signed)
Pt complaining of ear, noes and throat pain since Tuesday of this week. States "my ears are on fire!" and "I've been having pain when I swallow." Pt denies any fevers.

## 2015-04-27 ENCOUNTER — Ambulatory Visit: Payer: Federal, State, Local not specified - PPO | Admitting: Internal Medicine

## 2015-04-28 ENCOUNTER — Encounter: Payer: Self-pay | Admitting: Internal Medicine

## 2015-04-28 ENCOUNTER — Other Ambulatory Visit (INDEPENDENT_AMBULATORY_CARE_PROVIDER_SITE_OTHER): Payer: Federal, State, Local not specified - PPO

## 2015-04-28 ENCOUNTER — Ambulatory Visit (INDEPENDENT_AMBULATORY_CARE_PROVIDER_SITE_OTHER): Payer: Federal, State, Local not specified - PPO | Admitting: Internal Medicine

## 2015-04-28 VITALS — BP 122/72 | HR 99 | Temp 99.0°F | Resp 12 | Ht 65.5 in | Wt 291.0 lb

## 2015-04-28 DIAGNOSIS — R7301 Impaired fasting glucose: Secondary | ICD-10-CM

## 2015-04-28 DIAGNOSIS — M1990 Unspecified osteoarthritis, unspecified site: Secondary | ICD-10-CM

## 2015-04-28 DIAGNOSIS — M2550 Pain in unspecified joint: Secondary | ICD-10-CM | POA: Diagnosis not present

## 2015-04-28 LAB — BASIC METABOLIC PANEL
BUN: 10 mg/dL (ref 6–23)
CHLORIDE: 103 meq/L (ref 96–112)
CO2: 28 mEq/L (ref 19–32)
CREATININE: 0.97 mg/dL (ref 0.40–1.20)
Calcium: 9.6 mg/dL (ref 8.4–10.5)
GFR: 79.06 mL/min (ref >60.00)
Glucose, Bld: 94 mg/dL (ref 70–99)
Potassium: 3.9 mEq/L (ref 3.5–5.1)
SODIUM: 137 meq/L (ref 135–145)

## 2015-04-28 LAB — HEMOGLOBIN A1C: Hgb A1c MFr Bld: 5.5 % (ref 4.6–6.5)

## 2015-04-28 MED ORDER — ADAPALENE 0.1 % EX GEL: Freq: Every day | CUTANEOUS | Status: DC

## 2015-04-28 NOTE — Patient Instructions (Signed)
We will check your blood work today for any signs of rheumatoid arthritis.   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 Keeping them active and moving helps to reduce symptoms from arthritis.   We have sent in the refill of the face cream.   Osteoarthritis Osteoarthritis is a disease that causes soreness and inflammation of a joint. It occurs when the cartilage at the affected joint wears down. Cartilage acts as a cushion, covering the ends of bones where they meet to form a joint. Osteoarthritis is the most common form of arthritis. It often occurs in older people. The joints affected most often by this condition include those in the:  Ends of the fingers.  Thumbs.  Neck.  Lower back.  Knees.  Hips. CAUSES  Over time, the cartilage that covers the ends of bones begins to wear away. This causes bone to rub on bone, producing pain and stiffness in the affected joints.  RISK FACTORS Certain factors can increase your chances of having osteoarthritis, including:  Older age.  Excessive body weight.  Overuse of joints.  Previous joint injury. SIGNS AND SYMPTOMS   Pain, swelling, and stiffness in the joint.  Over time, the joint may lose its normal shape.  Small deposits of bone (osteophytes) may grow on the edges of the joint.  Bits of bone or cartilage can break off and float inside the joint space. This may cause more pain and damage. DIAGNOSIS  Your health care provider will do a physical exam and ask about your symptoms. Various tests may be ordered, such as:  X-rays of the affected joint.  An MRI scan.  Blood tests to rule out other types of arthritis.  Joint fluid tests. This involves using a needle to draw fluid from the joint and examining the fluid under a microscope. TREATMENT  Goals of treatment are to control pain and improve joint function. Treatment plans may include:  A prescribed exercise program that  allows for rest and joint relief.  A weight control plan.  Pain relief techniques, such as:  Properly applied heat and cold.  Electric pulses delivered to nerve endings under the skin (transcutaneous electrical nerve stimulation [TENS]).  Massage.  Certain nutritional supplements.  Medicines to control pain, such as:  Acetaminophen.  Nonsteroidal anti-inflammatory drugs (NSAIDs), such as naproxen.  Narcotic or central-acting agents, such as tramadol.  Corticosteroids. These can be given orally or as an injection.  Surgery to reposition the bones and relieve pain (osteotomy) or to remove loose pieces of bone and cartilage. Joint replacement may be needed in advanced states of osteoarthritis. HOME CARE INSTRUCTIONS   Take medicines only as directed by your health care provider.  Maintain a healthy weight. Follow your health care provider's instructions for weight control. This may include dietary instructions.  Exercise as directed. Your health care provider can recommend specific types of exercise. These may include:  Strengthening exercises. These are done to strengthen the muscles that support joints affected by arthritis. They can be performed with weights or with exercise bands to add resistance.  Aerobic activities. These are exercises, such as brisk walking or low-impact aerobics, that get your heart pumping.  Range-of-motion activities. These keep your joints limber.  Balance and agility exercises. These help you maintain daily living skills.  Rest your affected joints as directed by your health care provider.  Keep all follow-up visits as directed by your health care provider. SEEK MEDICAL CARE IF:  Your skin turns red.  You develop a rash in addition to your joint pain.  You have worsening joint pain.  You have a fever along with joint or muscle aches. SEEK IMMEDIATE MEDICAL CARE IF:  You have a significant loss of weight or appetite.  You have night  sweats. FOR MORE INFORMATION   National Institute of Arthritis and Musculoskeletal and Skin Diseases: www.niams.http://www.myers.net/  [employer] on Aging: https://walker.com/  American College of Rheumatology: www.rheumatology.org Document Released: 11/07/2005 Document Revised: 03/24/2014 Document Reviewed: 07/15/2013 [facility] Patient Information 2015 [place], [place]. This information is not intended to replace advice given to you by your health care provider. Make sure you discuss any questions you have with your health care provider.

## 2015-04-28 NOTE — Progress Notes (Signed)
Pre visit review using our clinic review tool, if applicable. No additional management support is needed unless otherwise documented below in the visit note. 

## 2015-04-29 LAB — RHEUMATOID FACTOR: RHEUMATOID FACTOR: 145 [IU]/mL — AB (ref <=14)

## 2015-05-01 DIAGNOSIS — M1990 Unspecified osteoarthritis, unspecified site: Secondary | ICD-10-CM | POA: Insufficient documentation

## 2015-05-01 NOTE — Assessment & Plan Note (Signed)
Checking rheumtoid factor although the most likely is osteoarthritis from her morbid obesity. She can continue using ibuprofen for the pain. Advised to keep working on exercise and stretching to keep the joints mobile and keep from getting stiff.

## 2015-05-01 NOTE — Progress Notes (Signed)
   Subjective:    Patient ID: Katherine Browning, female    DOB: 1966/05/03, 49 y.o.   MRN: [id number]  HPI The patient is a 49 YO female coming in for joint pain and stiffness in the morning. She is not able to quantify how long the stiffness lasts for in the morning but sounds to be short. She is having pain in her hands, wrists, ankles, knees in the morning. No joint swelling or redness. No new injury lately. She is taking ibuprofen rarely for the pain with good success. She is concerned about arthritis as she has heard about it. Denies fevers or chills.   Review of Systems  Constitutional: Negative for fever, activity change, appetite change, fatigue and unexpected weight change.  HENT: Negative.   Respiratory: Negative for cough, chest tightness, shortness of breath and wheezing.   Cardiovascular: Negative for chest pain, palpitations and leg swelling.  Gastrointestinal: Negative for abdominal pain, diarrhea, constipation and abdominal distention.  Musculoskeletal: Positive for back pain, arthralgias and gait problem.  Skin: Negative.   Neurological: Negative.   Psychiatric/Behavioral: Negative.       Objective:   Physical Exam  Constitutional: She is oriented to person, place, and time. She appears well-developed and well-nourished.  Morbidly obese  HENT:  Head: Normocephalic and atraumatic.  Mouth/Throat: Oropharynx is clear and moist.  Eyes: EOM are normal.  Neck: Normal range of motion.  Cardiovascular: Normal rate and regular rhythm.   Pulmonary/Chest: Effort normal and breath sounds normal. No respiratory distress. She has no wheezes. She has no rales.  Abdominal: Soft. Bowel sounds are normal. She exhibits no distension. There is no tenderness. There is no rebound.  Neurological: She is alert and oriented to person, place, and time.  Skin: Skin is warm and dry.   Filed Vitals:   04/28/15 1538  BP: 122/72  Pulse: 99  Temp: 99 F (37.2 C)  TempSrc: Oral  Resp: 12    Height: 5' 5.5" (1.664 m)  Weight: 291 lb (131.997 kg)  SpO2: 99%      Assessment & Plan:

## 2015-05-04 ENCOUNTER — Other Ambulatory Visit: Payer: Self-pay | Admitting: Internal Medicine

## 2015-05-04 DIAGNOSIS — R768 Other specified abnormal immunological findings in serum: Secondary | ICD-10-CM

## 2015-05-27 ENCOUNTER — Other Ambulatory Visit (INDEPENDENT_AMBULATORY_CARE_PROVIDER_SITE_OTHER): Payer: Federal, State, Local not specified - PPO

## 2015-05-27 ENCOUNTER — Telehealth: Payer: Self-pay | Admitting: Internal Medicine

## 2015-05-27 DIAGNOSIS — R768 Other specified abnormal immunological findings in serum: Secondary | ICD-10-CM | POA: Diagnosis not present

## 2015-05-27 LAB — SEDIMENTATION RATE: SED RATE: 49 mm/h — AB (ref 0–22)

## 2015-05-27 NOTE — Telephone Encounter (Signed)
Patient walked into the office this morning because she stated she could not get through on the phone. She is requesting that Dr. Dorise Hiss complete disability paperwork (she will have form faxed) for her arthritis, sciatica, hypertension, and arthritis in knees. She is also requesting a prescription for pain medication for the arthritis in her hands and rx for percocet for her back pain. Pt uses [facility]. [address] CB # [phone]

## 2015-05-28 ENCOUNTER — Other Ambulatory Visit: Payer: Self-pay | Admitting: Internal Medicine

## 2015-05-28 DIAGNOSIS — M069 Rheumatoid arthritis, unspecified: Secondary | ICD-10-CM

## 2015-05-28 LAB — CYCLIC CITRUL PEPTIDE ANTIBODY, IGG: Cyclic Citrullin Peptide Ab: 267.9 U/mL — ABNORMAL HIGH (ref 0.0–5.0)

## 2015-05-28 NOTE — Telephone Encounter (Signed)
In general you have to speak to a disability lawyer to start the process and they handle finding a provider.

## 2015-05-28 NOTE — Telephone Encounter (Signed)
So that I can speak with pt about the disability part. Is there a specific disability provider that pt would see? And would they need pt to be referred to them? Is there any that you know of?

## 2015-05-28 NOTE — Telephone Encounter (Signed)
Absolutely not to the percocet. She has been using ibuprofen with good success. If that has changed she needs visit. I am not a disability provider and cannot evaluate her for disability although at this time I do not think she would meet criteria for disability. If she disagrees then she needs to find and have disability evaluation.

## 2015-05-29 NOTE — Telephone Encounter (Signed)
Tried to call back. VM is full and not able to leave a message.

## 2015-05-29 NOTE — Telephone Encounter (Signed)
Pt called back, in she said she rec'd a call from someone?

## 2015-06-03 NOTE — Telephone Encounter (Signed)
Is there anything that I need to do?  

## 2015-06-03 NOTE — Telephone Encounter (Signed)
Patient called in upset re: not getting a call back . I advised per notes her VM was full. She mentioned that she was told to get a disability lawyer, but she is not applying for disability. She states that the disability certification is for her housing. Read below notes that dr Dorise Hiss does not believe that she would qualify for disability. She then advised that she is going to be getting a new doctor and stated through profanity that we are "racist people" and hung up on me.

## 2015-06-04 NOTE — Telephone Encounter (Signed)
I don't think so, thanks.

## 2015-06-06 ENCOUNTER — Emergency Department (HOSPITAL_COMMUNITY)
Admission: EM | Admit: 2015-06-06 | Discharge: 2015-06-06 | Disposition: A | Discharge status: Home / Self Care | Payer: Federal, State, Local not specified - PPO | Attending: Emergency Medicine | Admitting: Emergency Medicine

## 2015-06-06 ENCOUNTER — Encounter (HOSPITAL_COMMUNITY): Payer: Self-pay | Admitting: Nurse Practitioner

## 2015-06-06 DIAGNOSIS — Z79899 Other long term (current) drug therapy: Secondary | ICD-10-CM | POA: Diagnosis not present

## 2015-06-06 DIAGNOSIS — Z792 Long term (current) use of antibiotics: Secondary | ICD-10-CM | POA: Diagnosis not present

## 2015-06-06 DIAGNOSIS — I10 Essential (primary) hypertension: Secondary | ICD-10-CM | POA: Insufficient documentation

## 2015-06-06 DIAGNOSIS — K047 Periapical abscess without sinus: Secondary | ICD-10-CM | POA: Insufficient documentation

## 2015-06-06 DIAGNOSIS — K0889 Other specified disorders of teeth and supporting structures: Secondary | ICD-10-CM

## 2015-06-06 DIAGNOSIS — K088 Other specified disorders of teeth and supporting structures: Secondary | ICD-10-CM | POA: Insufficient documentation

## 2015-06-06 MED ORDER — IBUPROFEN 800 MG PO TABS: 800.0000 mg | ORAL_TABLET | Freq: Three times a day (TID) | ORAL | Status: DC

## 2015-06-06 MED ORDER — IBUPROFEN 600 MG PO TABS: 600.0000 mg | ORAL_TABLET | Freq: Four times a day (QID) | ORAL | Status: DC | PRN

## 2015-06-06 MED ORDER — PENICILLIN V POTASSIUM 500 MG PO TABS: 500.0000 mg | ORAL_TABLET | Freq: Four times a day (QID) | ORAL | Status: AC

## 2015-06-06 NOTE — ED Notes (Signed)
She c/o L lower tooth ache for past 2 days, states it feels swollen and infected and she was able to drain pus from the tooth which improved the pain.

## 2015-06-06 NOTE — Discharge Instructions (Signed)
Dental Pain °A tooth ache may be caused by cavities (tooth decay). Cavities expose the nerve of the tooth to air and hot or cold temperatures. It may come from an infection or abscess (also called a boil or furuncle) around your tooth. It is also often caused by dental caries (tooth decay). This causes the pain you are having. °DIAGNOSIS  °Your caregiver can diagnose this problem by exam. °TREATMENT  °· If caused by an infection, it may be treated with medications which kill germs (antibiotics) and pain medications as prescribed by your caregiver. Take medications as directed. °· Only take over-the-counter or prescription medicines for pain, discomfort, or fever as directed by your caregiver. °· Whether the tooth ache today is caused by infection or dental disease, you should see your dentist as soon as possible for further care. °SEEK MEDICAL CARE IF: °The exam and treatment you received today has been provided on an emergency basis only. This is not a substitute for complete medical or dental care. If your problem worsens or new problems (symptoms) appear, and you are unable to meet with your dentist, call or return to this location. °SEEK IMMEDIATE MEDICAL CARE IF:  °· You have a fever. °· You develop redness and swelling of your face, jaw, or neck. °· You are unable to open your mouth. °· You have severe pain uncontrolled by pain medicine. °MAKE SURE YOU:  °· Understand these instructions. °· Will watch your condition. °· Will get help right away if you are not doing well or get worse. °Document Released: 11/07/2005 Document Revised: 01/30/2012 Document Reviewed: 06/25/2008 °ExitCare® Patient Information ©2015 [employer]. This information is not intended to replace advice given to you by your health care provider. Make sure you discuss any questions you have with your health care provider. ° °Emergency Department Resource Guide °1) Find a Doctor and Pay Out of Pocket °Although you won't have to find out who  is covered by your insurance plan, it is a good idea to ask around and get recommendations. You will then need to call the office and see if the doctor you have chosen will accept you as a new patient and what types of options they offer for patients who are self-pay. Some doctors offer discounts or will set up payment plans for their patients who do not have insurance, but you will need to ask so you aren't surprised when you get to your appointment. ° °2) Contact Your Local Health Department °Not all health departments have doctors that can see patients for sick visits, but many do, so it is worth a call to see if yours does. If you don't know where your local health department is, you can check in your phone book. The CDC also has a tool to help you locate your state's health department, and many state websites also have listings of all of their local health departments. ° °3) Find a Walk-in Clinic °If your illness is not likely to be very severe or complicated, you may want to try a walk in clinic. These are popping up all over the country in pharmacies, drugstores, and shopping centers. They're usually staffed by nurse practitioners or physician assistants that have been trained to treat common illnesses and complaints. They're usually fairly quick and inexpensive. However, if you have serious medical issues or chronic medical problems, these are probably not your best option. ° °No Primary Care Doctor: °- Call [facility] at  832-8000 - they can help you locate a primary   care doctor that  accepts your insurance, provides certain services, etc. °- Physician Referral Service- 1-800-533-3463 ° °Chronic Pain Problems: °Organization         Address  Phone   Notes  °[facility]  (336) 297-2271 Patients need to be referred by their primary care doctor.  ° °Medication Assistance: °Organization         Address  Phone   Notes  °[place] [facility] [address]  [address] °[place], [place] [address] (336) 641-8030 --Must be a resident of [place] °-- Must have NO insurance coverage whatsoever (no Medicaid/ Medicare, etc.) °-- The pt. MUST have a primary care doctor that directs their care regularly and follows them in the community °  °[facility]  (866) 331-1348   °[employer]  (888) 892-1162   ° °Agencies that provide inexpensive medical care: °Organization         Address  Phone   Notes  °[facility]  (336) 832-8035   °[facility]    (336) 832-7272   °[facility] [address] °[place], [place] [address] (336) 832-4777   °[facility] [address], °[place] (336) 271-4999   °[facility]    (336) 373-0678   °[facility]    (336) 272-1050   °[facility] ° [address], [place] Phone:  (336) 832-4444, Fax:  (336) 832-4440 Hours of Operation:  9 am - 6 pm, M-F.  Also accepts Medicaid/Medicare and self-pay.  ° [facility] ° [address], [place] Phone: (336) 832-3150, Fax: (336) 832-3151. Hours of Operation:  8:30 am - 5:30 pm, M-F.  Also accepts Medicaid and self-pay.  °[facility] [place] [address], [place] Phone: (336) 878-6027   °[facility] [address], [place], [place] (336)723-1848, Ext. 123 Mondays & Thursdays: 7-9 AM.  First 15 patients are seen on a first come, first serve basis. °  ° °Medicaid-accepting [place] Providers: ° °Organization         Address  Phone   Notes  °[facility] [address], [place] (336) 641-2100 Also accepts self-pay patients.  °[facility] [address], [place] ° (336) 856-9996   °[facility] [address], [place] (336) 288-8857   °[facility] [address] [place] [address], [place] (336) 299-7000   °Veita Bland [address], [place]  ° (336) 373-1557 Only accepts [place] Access Medicaid patients after they have their name applied to their card.  ° °Self-Pay (no insurance) in [place]: ° °Organization         Address  Phone   Notes  °Sickle Cell Patients, [facility] [address], [place] (336) 832-1970   °[facility] [address], [place] (336) 832-4400   °[facility] ° [address], [place] (336) 992-4800   °[facility]/Dr. Osei-Bonsu ° [address] [place] [address], [place] or [address], [place] (336) 841-8500 Phone number for both [place] and [place] locations is the same.  °[facility] [address], [place] (336) 299-0000   °[facility] [address] [place] [address],  or [address] (336) 852-7530 °(336) 878-2260   °  [facility] [address], [place] (336) 350-1642, phone; (336) 294-5005, fax Sees patients 1st and 3rd Saturday of every month.  Must not qualify for public or private insurance (i.e. Medicaid, Medicare, [place] Health Choice, Veterans' Benefits) • Household income should be no more than 200% of the poverty level •The clinic cannot treat you if you are pregnant or think you are pregnant • Sexually transmitted diseases are not treated at the clinic.  ° ° °Dental Care: °Organization         Address  Phone  Notes  °[place] [facility] [address], [place] (336) 641-6152 Accepts children up to age 21 who are enrolled in Medicaid or [place] Health Choice; pregnant women with a Medicaid card; and children who have applied for Medicaid or [place] Health Choice, but were declined, whose parents can pay a reduced fee at time of service.  °[facility] [place]  [address], [place] (336) 641-7733 Accepts children up to age 21 who are  enrolled in Medicaid or [place] Health Choice; pregnant women with a Medicaid card; and children who have applied for Medicaid or [place] Health Choice, but were declined, whose parents can pay a reduced fee at time of service.  °[facility] ° [address], [place] (336) 641-4533 Patients are seen by appointment only. Walk-ins are not accepted. [facility] will see patients 18 years of age and older. °Monday - Tuesday (8am-5pm) °Most Wednesdays (8:30-5pm) °$30 per visit, cash only  °[facility] ° [address], [place] (336) 641-4533 Patients are seen by appointment only. Walk-ins are not accepted. [facility] will see patients 18 years of age and older. °One Wednesday Evening (Monthly: Volunteer Based).  $30 per visit, cash only  °[facility]  (919) 537-3737 for adults; Children under age 4, call Graduate Pediatric Dentistry at (919) [phone]. Children aged 4-14, please call (919) [phone] to request a pediatric application. ° Dental services are provided in all areas of dental care including fillings, crowns and bridges, complete and partial dentures, implants, gum treatment, root canals, and extractions. Preventive care is also provided. Treatment is provided to both adults and children. °Patients are selected via a lottery and there is often a waiting list. °  °[facility] [address], °[place] ° (336) 763-8833 [email or web address] °  °[facility] [address], [place], [place] (336)723-1848, Ext. 123 Second and Fourth Thursday of each month, opens at 6:30 AM; Clinic ends at 9 AM.  Patients are seen on a first-come first-served basis, and a limited number are seen during each clinic.  ° °[facility] ° [address], [place], [place] (336) 723-7904   Eligibility Requirements °You must have lived in [place], [place], or [place] counties for at least the last three months. °  You  cannot be eligible for state or federal sponsored healthcare insurance, including Veterans Administration, Medicaid, or Medicare. °  You generally cannot be eligible for healthcare insurance through your employer.  °  How to apply: °Eligibility screenings are held every Tuesday and Wednesday afternoon from 1:00 pm until 4:00 pm. You do not need an appointment for the interview!  °[facility] [address], [place],  336-631-2330   °[facility]  336-342-8273   °[facility]  336-703-3100   °[facility]  336-570-6415   ° °Behavioral Health Resources in the Community: °Intensive Outpatient Programs °Organization         Address  Phone  Notes  °[facility] [address], [place], [place] 336-878-6098   °[facility] [address], [place], [place] 336-832-9800   °[facility] [address], [place], [place] ° 336-882-2125   °[facility] [address],  °[place], [place] 1-800-853-5163 or 336-641-4981   °Substance Abuse Resources °Organization         Address  Phone  Notes  °[facility]  336-882-2125   °[facility]  336-784-9470   °[facility]  336-285-9073   °[facility]  336-845-3988   °Residential & Outpatient Substance Abuse Program  1-800-659-3381   °Psychological Services °Organization         Address  Phone  Notes  °[facility]  336- 832-9600   °[facility]  336- 378-7881   °[facility] [address], [place] 1-800-853-5163 or 336-641-4981   ° °Mobile Crisis Teams °Organization         Address  Phone  Notes  °[facility], Mobile Crisis Care Unit  1-877-626-1772   °Assertive °[facility] ° [address] [place], [place] 336-834-9664   °Sharon DeEsch [address] °[place] 336-554-5454   ° °Self-Help/Support  Groups °Organization         Address  Phone             Notes  °[facility] [place] - variety of support groups  336- 373-1402 Call for more information  °Narcotics Anonymous (NA), [facility] [address], °[place]  2 meetings at this location  ° °Residential Treatment Programs °Organization         Address  Phone  Notes  °[facility] [address],    °[place]  1-866-801-8205   °[facility] ° [address], [place], [place] 704-293-8524   °[facility] [address], [place] 336-845-3988 Admissions: 8am-3pm M-F  °[facility] [address]    °[place], [place] 336-841-1104   °[facility] [address], [place], [place] 336-379-7146   °[facility] [address]  °[place], [place] 336-285-9073   °[facility] - Intensive Outpatient [address], [place], [place] 336-852-3033   °[facility] [address]  °[place], [place] 1-877-615-2722 or 336-784-9470   °[facility] [address] [place], [place] 336-227-7417 Accepts Medicaid  °[facility] [address]  °[place] 1-800-659-3381 Substance Abuse/Addiction Treatment  ° °[place] Behavioral Health Resources °Organization         Address  Phone  Notes  °[facility]  (888) 581-9988   °Julie Brannon, PhD [address] [place], [place]   (336) 349-5553 or (336) 951-0000   °[facility]   [address] °[place], [place] (336) 349-4454   °[facility] [address], [place], [place] (336) 342-8316 Insurance/Medicaid/sponsorship through [facility]  °[facility] [address]                                    [place],  (336) 342-8316 Therapy/tele-psych/case  °[facility]   [address]  ° [place], [place] (336) 349-2233    °Dr. Arfeen  (336) 349-4544   °[facility] [place]  [facility] [place] [facility] 1) [address], [place] °2) [address], [place] °3)  [address], [place] (336) 349-3220 °(336) 342-7768 ° °(336) 342-8140   °[place] [facility] (336) 342-1394 or (336) 342-3537 (After Hours)    ° ° ° °

## 2015-06-06 NOTE — ED Notes (Signed)
Declined W/C at D/C and was escorted to lobby by RN. 

## 2015-06-06 NOTE — ED Provider Notes (Signed)
CSN: [id number]     Arrival date & time 06/06/15  1417 History  This chart was scribed for Katherine Sanders, PA-C, working with Katherine Rhine, MD by Chestine Spore, ED Scribe. The patient was seen in room TR11C/TR11C at 3:15 PM.     Chief Complaint  Patient presents with  . Dental Pain     The history is provided by the patient. No language interpreter was used.    HPI Comments: Katherine Browning is a 49 y.o. female who presents to the Emergency Department complaining of left lower dental pain onset 2 days. Pt reports that her tooth feels as if it is swollen. Pt reports that she was able to drain pus from the tooth and that improved her pain. Pt denies this ever happening in the past and she does not have a dentist. Pt reports that the tooth that she thinks is infected is broken. Pt is very adamant about wanting a dental referral and abx Rx. She denies fever, n/v, and any other symptoms.   Past Medical History  Diagnosis Date  . Back pain   . Hypertension   . Knee pain    Past Surgical History  Procedure Laterality Date  . Ectopic pregnancy surgery     Family History  Problem Relation Age of Onset  . Hypertension Other    History  Substance Use Topics  . Smoking status: Never Smoker   . Smokeless tobacco: Not on file  . Alcohol Use: No     Comment: occ   OB History    No data available     Review of Systems  Constitutional: Negative for fever.  HENT: Positive for dental problem.   Gastrointestinal: Negative for nausea and vomiting.      Allergies  Biaxin and Iodine  Home Medications   Prior to Admission medications   Medication Sig Start Date End Date Taking? Authorizing Provider  adapalene (DIFFERIN) 0.1 % gel Apply topically at bedtime. 04/28/15   Judie Bonus, MD  amoxicillin (AMOXIL) 500 MG capsule Take 1 capsule (500 mg total) by mouth 3 (three) times daily. 04/24/15   Roxy Horseman, PA-C  cloNIDine (CATAPRES) 0.1 MG tablet Take 0.1 mg by mouth 2 (two) times  daily.    Historical Provider, MD  ibuprofen (ADVIL,MOTRIN) 800 MG tablet Take 1 tablet (800 mg total) by mouth 3 (three) times daily. 06/06/15   Ladona Mow, PA-C  lisinopril (PRINIVIL,ZESTRIL) 10 MG tablet Take 1 tablet (10 mg total) by mouth daily. 04/13/15   Judie Bonus, MD  penicillin v potassium (VEETID) 500 MG tablet Take 1 tablet (500 mg total) by mouth 4 (four) times daily. 06/06/15 06/13/15  Ladona Mow, PA-C   BP 146/94 mmHg  Pulse 85  Temp(Src) 98.5 F (36.9 C) (Oral)  Ht 5\' 5"  (1.651 m)  Wt 274 lb 9.6 oz (124.558 kg)  BMI 45.70 kg/m2  SpO2 100% Physical Exam  Constitutional: She is oriented to person, place, and time. She appears well-developed and well-nourished. No distress.  HENT:  Head: Normocephalic and atraumatic.  Mouth/Throat: No trismus in the jaw. Dental abscesses present.  Broken first premolar on the bottom left. Gumline intact. There is a small area of purulent drainage on the lateral border of lower gumline. Floor of mouth is soft. No dysphasia or trismus.   Eyes: EOM are normal.  Neck: Neck supple. No tracheal deviation present.  Cardiovascular: Normal rate.   Pulmonary/Chest: Effort normal. No respiratory distress.  Musculoskeletal: Normal range of motion.  Lymphadenopathy:    She has cervical adenopathy.  Mild anterior cervical LAD  Neurological: She is alert and oriented to person, place, and time.  Skin: Skin is warm and dry.  Psychiatric: She has a normal mood and affect. Her behavior is normal.  Nursing note and vitals reviewed.   ED Course  Procedures (including critical care time) DIAGNOSTIC STUDIES: Oxygen Saturation is 100% on RA, nl by my interpretation.    COORDINATION OF CARE: 3:18 PM-Discussed treatment plan with pt at bedside and pt agreed to plan.   Labs Review Labs Reviewed - No data to display  Imaging Review No results found.   EKG Interpretation None      MDM   Final diagnoses:  Pain, dental    Patient with  toothache.  Patient afebrile, hemodynamically stable, well-appearing and in no acute distress. Mild signs and symptoms concerning for a beginning abscess in lateral gumline. Offered incision and drainage, patient's refused . Risks/benefits discussed.  Patient adamant that she does not have incision and drainage. Exam unconcerning for Ludwig's angina or spread of infection.  Will treat with penicillin and pain medicine.  Urged patient to follow-up with dentist.  Discussed return precautions with patient, patient verbalizes understanding and agreement of this plan.   I personally performed the services described in this documentation, which was scribed in my presence. The recorded information has been reviewed and is accurate.  BP 146/94 mmHg  Pulse 85  Temp(Src) 98.5 F (36.9 C) (Oral)  Ht 5\' 5"  (1.651 m)  Wt 274 lb 9.6 oz (124.558 kg)  BMI 45.70 kg/m2  SpO2 100%  Signed,  , PA-C 5:57 PM    Ladona Mow, PA-C 06/06/15 1757  06/08/15, MD 06/07/15 1304

## 2015-06-08 ENCOUNTER — Other Ambulatory Visit: Payer: Self-pay | Admitting: Internal Medicine

## 2015-07-22 ENCOUNTER — Other Ambulatory Visit: Payer: Self-pay | Admitting: Internal Medicine

## 2015-09-30 ENCOUNTER — Encounter (HOSPITAL_COMMUNITY): Payer: Self-pay

## 2015-09-30 ENCOUNTER — Emergency Department (HOSPITAL_COMMUNITY)
Admission: EM | Admit: 2015-09-30 | Discharge: 2015-09-30 | Disposition: A | Discharge status: Home / Self Care | Payer: Federal, State, Local not specified - PPO | Attending: Emergency Medicine | Admitting: Emergency Medicine

## 2015-09-30 DIAGNOSIS — M25551 Pain in right hip: Secondary | ICD-10-CM | POA: Diagnosis present

## 2015-09-30 DIAGNOSIS — I10 Essential (primary) hypertension: Secondary | ICD-10-CM | POA: Insufficient documentation

## 2015-09-30 DIAGNOSIS — Z79899 Other long term (current) drug therapy: Secondary | ICD-10-CM | POA: Diagnosis not present

## 2015-09-30 DIAGNOSIS — Z791 Long term (current) use of non-steroidal anti-inflammatories (NSAID): Secondary | ICD-10-CM | POA: Insufficient documentation

## 2015-09-30 DIAGNOSIS — M5431 Sciatica, right side: Secondary | ICD-10-CM | POA: Diagnosis not present

## 2015-09-30 DIAGNOSIS — M79604 Pain in right leg: Secondary | ICD-10-CM | POA: Insufficient documentation

## 2015-09-30 MED ORDER — NAPROXEN 500 MG PO TABS: 500.0000 mg | ORAL_TABLET | Freq: Two times a day (BID) | ORAL | Status: DC

## 2015-09-30 MED ORDER — PREDNISONE 10 MG PO TABS: ORAL_TABLET | ORAL | Status: DC

## 2015-09-30 NOTE — ED Provider Notes (Signed)
CSN: [id number]     Arrival date & time 09/30/15  1926 History  By signing my name below, I, Soijett Blue, attest that this documentation has been prepared under the direction and in the presence of Elpidio Anis, PA-C Electronically Signed: Soijett Blue, ED Scribe. 09/30/2015. 8:55 PM.   Chief Complaint  Patient presents with  . Leg Pain      The history is provided by the patient. No language interpreter was used.    Katherine Browning is a 49 y.o. female with a medical hx of HTN, sciatica, and knee pain who presents to the Emergency Department complaining of moderate, constant, right sided hip/leg pain onset 4 days. Pt denies any injury/trauma to the area. She notes that she had a jerking motion occur and that is when her pain began. She notes that she has muscle relaxer and she doesn't want them today due to her pain not being alleviated. She notes that she has tried muscle relaxers with no relief of her symptoms. She denies color change, wound, rash, joint swelling, and any other symptoms. She states that she does have a PCP.   Past Medical History  Diagnosis Date  . Back pain   . Hypertension   . Knee pain    Past Surgical History  Procedure Laterality Date  . Ectopic pregnancy surgery     Family History  Problem Relation Age of Onset  . Hypertension Other    Social History  Substance Use Topics  . Smoking status: Never Smoker   . Smokeless tobacco: None  . Alcohol Use: No     Comment: occ   OB History    No data available     Review of Systems  Musculoskeletal: Positive for arthralgias. Negative for joint swelling and gait problem.  Skin: Negative for color change and rash.      Allergies  Biaxin and Iodine  Home Medications   Prior to Admission medications   Medication Sig Start Date End Date Taking? Authorizing Provider  cloNIDine (CATAPRES) 0.1 MG tablet TAKE 1 TABLET BY MOUTH EVERY MORNING 07/22/15  Yes Myrlene Broker, MD  cyclobenzaprine (FLEXERIL)  10 MG tablet Take 1 tablet by mouth 3 (three) times daily as needed. Muscle spasms 07/30/15  Yes Historical Provider, MD  ibuprofen (ADVIL,MOTRIN) 800 MG tablet Take 1 tablet (800 mg total) by mouth 3 (three) times daily. 06/06/15  Yes Ladona Mow, PA-C  lisinopril (PRINIVIL,ZESTRIL) 10 MG tablet TAKE 1 TABLET(10 MG) BY MOUTH DAILY 06/08/15  Yes Myrlene Broker, MD  adapalene (DIFFERIN) 0.1 % gel Apply topically at bedtime. Patient not taking: Reported on 09/30/2015 04/28/15   Myrlene Broker, MD  amoxicillin (AMOXIL) 500 MG capsule Take 1 capsule (500 mg total) by mouth 3 (three) times daily. Patient not taking: Reported on 09/30/2015 04/24/15   Roxy Horseman, PA-C   BP 151/99 mmHg  Pulse 86  Temp(Src) 99.5 F (37.5 C) (Oral)  Resp 18  SpO2 100%  LMP 08/31/2015 Physical Exam  Constitutional: She is oriented to person, place, and time. She appears well-developed and well-nourished. No distress.  HENT:  Head: Normocephalic and atraumatic.  Eyes: EOM are normal.  Neck: Neck supple.  Cardiovascular: Normal rate.   Pulmonary/Chest: Effort normal. No respiratory distress.  Abdominal: Soft. There is no tenderness.  Musculoskeletal: Normal range of motion.  No lumbar tenderness. No reproducible sciatic tenderness. Distal pulses intact. Full ROM.  Neurological: She is alert and oriented to person, place, and time.  Intact DTR right  lower extremity.   Skin: Skin is warm and dry.  Psychiatric: She has a normal mood and affect. Her behavior is normal.  Nursing note and vitals reviewed.   ED Course  Procedures (including critical care time) DIAGNOSTIC STUDIES: Oxygen Saturation is 100% on RA, nl by my interpretation.    COORDINATION OF CARE: 8:56 PM Discussed treatment plan with pt at bedside which includes f/u with PCP and pt agreed to plan.    Labs Review Labs Reviewed - No data to display  Imaging Review No results found. I have personally reviewed and evaluated these images  and lab results as part of my medical decision-making.   EKG Interpretation None      MDM   Final diagnoses:  None    1. Sciatica, recurrent  No neurologic red flags on presentation or exam.   The patient was prescribed Naproxen with flexeril but was unhappy with prescriptions. Discussed that they were medications indicated for her condition and encouraged her to follow up with her doctor for further management. She requested steroidal rather than non-steroidal medication which was provided.   I personally performed the services described in this documentation, which was scribed in my presence. The recorded information has been reviewed and is accurate.     Elpidio Anis, PA-C 10/05/15 6237  Leta Baptist, MD 10/06/15 [phone]

## 2015-09-30 NOTE — ED Notes (Signed)
Pt complains of right sided hip and leg pain since Sunday, hx of sciatica, no new injury

## 2015-09-30 NOTE — Discharge Instructions (Signed)
CONTINUE USE OF YOUR MUSCLE RELAXER AS WELL AS THE PRESCRIBED ANTI-INFLAMMATORY MEDICATION FOR RECURRENT SYMPTOMS OF LEG PAIN THOUGHT SCIATIC NERVE PAIN. FOLLOW UP WITH YOUR DOCTOR IF PAIN PERSISTS OR RECURS.   Sciatica Sciatica is pain, weakness, numbness, or tingling along the path of the sciatic nerve. The nerve starts in the lower back and runs down the back of each leg. The nerve controls the muscles in the lower leg and in the back of the knee, while also providing sensation to the back of the thigh, lower leg, and the sole of your foot. Sciatica is a symptom of another medical condition. For instance, nerve damage or certain conditions, such as a herniated disk or bone spur on the spine, pinch or put pressure on the sciatic nerve. This causes the pain, weakness, or other sensations normally associated with sciatica. Generally, sciatica only affects one side of the body. CAUSES   Herniated or slipped disc.  Degenerative disk disease.  A pain disorder involving the narrow muscle in the buttocks (piriformis syndrome).  Pelvic injury or fracture.  Pregnancy.  Tumor (rare). SYMPTOMS  Symptoms can vary from mild to very severe. The symptoms usually travel from the low back to the buttocks and down the back of the leg. Symptoms can include:  Mild tingling or dull aches in the lower back, leg, or hip.  Numbness in the back of the calf or sole of the foot.  Burning sensations in the lower back, leg, or hip.  Sharp pains in the lower back, leg, or hip.  Leg weakness.  Severe back pain inhibiting movement. These symptoms may get worse with coughing, sneezing, laughing, or prolonged sitting or standing. Also, being overweight may worsen symptoms. DIAGNOSIS  Your caregiver will perform a physical exam to look for common symptoms of sciatica. He or she may ask you to do certain movements or activities that would trigger sciatic nerve pain. Other tests may be performed to find the cause of  the sciatica. These may include:  Blood tests.  X-rays.  Imaging tests, such as an MRI or CT scan. TREATMENT  Treatment is directed at the cause of the sciatic pain. Sometimes, treatment is not necessary and the pain and discomfort goes away on its own. If treatment is needed, your caregiver may suggest:  Over-the-counter medicines to relieve pain.  Prescription medicines, such as anti-inflammatory medicine, muscle relaxants, or narcotics.  Applying heat or ice to the painful area.  Steroid injections to lessen pain, irritation, and inflammation around the nerve.  Reducing activity during periods of pain.  Exercising and stretching to strengthen your abdomen and improve flexibility of your spine. Your caregiver may suggest losing weight if the extra weight makes the back pain worse.  Physical therapy.  Surgery to eliminate what is pressing or pinching the nerve, such as a bone spur or part of a herniated disk. HOME CARE INSTRUCTIONS   Only take over-the-counter or prescription medicines for pain or discomfort as directed by your caregiver.  Apply ice to the affected area for 20 minutes, 3-4 times a day for the first 48-72 hours. Then try heat in the same way.  Exercise, stretch, or perform your usual activities if these do not aggravate your pain.  Attend physical therapy sessions as directed by your caregiver.  Keep all follow-up appointments as directed by your caregiver.  Do not wear high heels or shoes that do not provide proper support.  Check your mattress to see if it is too soft. A  firm mattress may lessen your pain and discomfort. SEEK IMMEDIATE MEDICAL CARE IF:   You lose control of your bowel or bladder (incontinence).  You have increasing weakness in the lower back, pelvis, buttocks, or legs.  You have redness or swelling of your back.  You have a burning sensation when you urinate.  You have pain that gets worse when you lie down or awakens you at  night.  Your pain is worse than you have experienced in the past.  Your pain is lasting longer than 4 weeks.  You are suddenly losing weight without reason. MAKE SURE YOU:  Understand these instructions.  Will watch your condition.  Will get help right away if you are not doing well or get worse.   This information is not intended to replace advice given to you by your health care provider. Make sure you discuss any questions you have with your health care provider.   Document Released: 11/01/2001 Document Revised: 07/29/2015 Document Reviewed: 03/18/2012 Elsevier Interactive Patient Education [employer]

## 2015-09-30 NOTE — ED Notes (Signed)
Patient angry that she is not getting a different prescription than Naproxen and Prednisone. PA spoke to patient in the presence of this nurse when a discussion was had regarding narcotics prescribed in the ER. Patient states the PCP listed on her D/C papers, whom she has an upcoming appointment listed with as well, is not her PCP. Patient was advised to follow up with the primary care physician of her choice. Patient yanked the discharge instructions from this nurses hand as she attempted to review them. Patient was discharged to the lobby via w/c by NT.

## 2015-10-28 ENCOUNTER — Ambulatory Visit: Payer: Federal, State, Local not specified - PPO | Admitting: Internal Medicine

## 2015-11-06 ENCOUNTER — Other Ambulatory Visit: Payer: Self-pay | Admitting: Internal Medicine

## 2016-01-18 ENCOUNTER — Encounter: Payer: Self-pay | Admitting: Family

## 2016-01-18 ENCOUNTER — Ambulatory Visit (INDEPENDENT_AMBULATORY_CARE_PROVIDER_SITE_OTHER): Payer: Federal, State, Local not specified - PPO | Admitting: Family

## 2016-01-18 ENCOUNTER — Telehealth: Payer: Self-pay | Admitting: Family

## 2016-01-18 VITALS — BP 128/80 | HR 70 | Temp 98.0°F | Resp 16 | Ht 65.5 in | Wt 250.4 lb

## 2016-01-18 DIAGNOSIS — M25562 Pain in left knee: Secondary | ICD-10-CM | POA: Diagnosis not present

## 2016-01-18 NOTE — Patient Instructions (Addendum)
Thank you for choosing [facility].  Summary/Instructions:  Ice 2-3 times per day as needed for discomfort. Continue alternative exercise including elliptical and cycling as needed for exercise. Exercises daily. Continue over the counter anti-inflammatories as needed. Knee sleeve for comfort.  If your symptoms worsen or fail to improve, please contact our office for further instruction, or in case of emergency go directly to the emergency room at the closest medical facility.   Knee Exercises EXERCISES RANGE OF MOTION (ROM) AND STRETCHING EXERCISES These exercises may help you when beginning to rehabilitate your injury. Your symptoms may resolve with or without further involvement from your physician, physical therapist, or athletic trainer. While completing these exercises, remember:   Restoring tissue flexibility helps normal motion to return to the joints. This allows healthier, less painful movement and activity.  An effective stretch should be held for at least 30 seconds.  A stretch should never be painful. You should only feel a gentle lengthening or release in the stretched tissue. STRETCH - Knee Extension, Prone  Lie on your stomach on a firm surface, such as a bed or countertop. Place your right / left knee and leg just beyond the edge of the surface. You may wish to place a towel under the far end of your right / left thigh for comfort.  Relax your leg muscles and allow gravity to straighten your knee. Your clinician may advise you to add an ankle weight if more resistance is helpful for you.  You should feel a stretch in the back of your right / left knee. Hold this position for __________ seconds. Repeat __________ times. Complete this stretch __________ times per day. * Your physician, physical therapist, or athletic trainer may ask you to add ankle weight to enhance your stretch.  RANGE OF MOTION - Knee Flexion, Active  Lie on your back with both knees straight.  (If this causes back discomfort, bend your opposite knee, placing your foot flat on the floor.)  Slowly slide your heel back toward your buttocks until you feel a gentle stretch in the front of your knee or thigh.  Hold for __________ seconds. Slowly slide your heel back to the starting position. Repeat __________ times. Complete this exercise __________ times per day.  STRETCH - Quadriceps, Prone   Lie on your stomach on a firm surface, such as a bed or padded floor.  Bend your right / left knee and grasp your ankle. If you are unable to reach your ankle or pant leg, use a belt around your foot to lengthen your reach.  Gently pull your heel toward your buttocks. Your knee should not slide out to the side. You should feel a stretch in the front of your thigh and/or knee.  Hold this position for __________ seconds. Repeat __________ times. Complete this stretch __________ times per day.  STRETCH - Hamstrings, Supine   Lie on your back. Loop a belt or towel over the ball of your right / left foot.  Straighten your right / left knee and slowly pull on the belt to raise your leg. Do not allow the right / left knee to bend. Keep your opposite leg flat on the floor.  Raise the leg until you feel a gentle stretch behind your right / left knee or thigh. Hold this position for __________ seconds. Repeat __________ times. Complete this stretch __________ times per day.  STRENGTHENING EXERCISES These exercises may help you when beginning to rehabilitate your injury. They may resolve your symptoms  with or without further involvement from your physician, physical therapist, or athletic trainer. While completing these exercises, remember:   Muscles can gain both the endurance and the strength needed for everyday activities through controlled exercises.  Complete these exercises as instructed by your physician, physical therapist, or athletic trainer. Progress the resistance and repetitions only as  guided.  You may experience muscle soreness or fatigue, but the pain or discomfort you are trying to eliminate should never worsen during these exercises. If this pain does worsen, stop and make certain you are following the directions exactly. If the pain is still present after adjustments, discontinue the exercise until you can discuss the trouble with your clinician. STRENGTH - Quadriceps, Isometrics  Lie on your back with your right / left leg extended and your opposite knee bent.  Gradually tense the muscles in the front of your right / left thigh. You should see either your knee cap slide up toward your hip or increased dimpling just above the knee. This motion will push the back of the knee down toward the floor/mat/bed on which you are lying.  Hold the muscle as tight as you can without increasing your pain for __________ seconds.  Relax the muscles slowly and completely in between each repetition. Repeat __________ times. Complete this exercise __________ times per day.  STRENGTH - Quadriceps, Short Arcs   Lie on your back. Place a __________ inch towel roll under your knee so that the knee slightly bends.  Raise only your lower leg by tightening the muscles in the front of your thigh. Do not allow your thigh to rise.  Hold this position for __________ seconds. Repeat __________ times. Complete this exercise __________ times per day.  OPTIONAL ANKLE WEIGHTS: Begin with ____________________, but DO NOT exceed ____________________. Increase in 1 pound/0.5 kilogram increments.  STRENGTH - Quadriceps, Straight Leg Raises  Quality counts! Watch for signs that the quadriceps muscle is working to insure you are strengthening the correct muscles and not "cheating" by substituting with healthier muscles.  Lay on your back with your right / left leg extended and your opposite knee bent.  Tense the muscles in the front of your right / left thigh. You should see either your knee cap slide up  or increased dimpling just above the knee. Your thigh may even quiver.  Tighten these muscles even more and raise your leg 4 to 6 inches off the floor. Hold for __________ seconds.  Keeping these muscles tense, lower your leg.  Relax the muscles slowly and completely in between each repetition. Repeat __________ times. Complete this exercise __________ times per day.  STRENGTH - Hamstring, Curls  Lay on your stomach with your legs extended. (If you lay on a bed, your feet may hang over the edge.)  Tighten the muscles in the back of your thigh to bend your right / left knee up to 90 degrees. Keep your hips flat on the bed/floor.  Hold this position for __________ seconds.  Slowly lower your leg back to the starting position. Repeat __________ times. Complete this exercise __________ times per day.  OPTIONAL ANKLE WEIGHTS: Begin with ____________________, but DO NOT exceed ____________________. Increase in 1 pound/0.5 kilogram increments.  STRENGTH - Quadriceps, Squats  Stand in a door frame so that your feet and knees are in line with the frame.  Use your hands for balance, not support, on the frame.  Slowly lower your weight, bending at the hips and knees. Keep your lower legs upright so  that they are parallel with the door frame. Squat only within the range that does not increase your knee pain. Never let your hips drop below your knees.  Slowly return upright, pushing with your legs, not pulling with your hands. Repeat __________ times. Complete this exercise __________ times per day.  STRENGTH - Quadriceps, Wall Slides  Follow guidelines for form closely. Increased knee pain often results from poorly placed feet or knees.  Lean against a smooth wall or door and walk your feet out 18-24 inches. Place your feet hip-width apart.  Slowly slide down the wall or door until your knees bend __________ degrees.* Keep your knees over your heels, not your toes, and in line with your hips,  not falling to either side.  Hold for __________ seconds. Stand up to rest for __________ seconds in between each repetition. Repeat __________ times. Complete this exercise __________ times per day. * Your physician, physical therapist, or athletic trainer will alter this angle based on your symptoms and progress.   This information is not intended to replace advice given to you by your health care provider. Make sure you discuss any questions you have with your health care provider.   Document Released: 09/21/2005 Document Revised: 11/28/2014 Document Reviewed: 02/19/2009 Elsevier Interactive Patient Education [employer]

## 2016-01-18 NOTE — Assessment & Plan Note (Addendum)
Continues to experience left knee pain resolved with decreased activity and most likely related to treadmill use. Recommend alternative cardiovascular including elliptical, cycling and aquatic therapy. Ice as needed for discomfort. Does have significant muscle imbalance with weak gluteus medius and increased genu valgum.  Continue to work on weight loss as she has lost 40 pounds to this point. No indication for cortisone at present. Imaging if symptoms worsen.

## 2016-01-18 NOTE — Telephone Encounter (Signed)
Pt just saw Tammy Sours Today and request to speak to the assistant concern about BP medication. It needs to be send to specific pharmacy and she wants to talk to assistant.

## 2016-01-18 NOTE — Progress Notes (Signed)
Subjective:    Patient ID: Katherine Browning, female    DOB: 1966-11-21, 50 y.o.   MRN: [id number]  Chief Complaint  Patient presents with  . Leg Pain    Leg pain and knee pain    HPI:  Katherine Browning is a 50 y.o. female who  has a past medical history of Back pain; Hypertension; and Knee pain. and presents today for an acute office visit.   Associated symptom of pain located in her bilateral lower extremities and specifically her left knee has been going on for several weeks. Described as a tightening feeling. She has had some new exercises in the past week. Has not exercised in the past 4 days and has noted improvement in her symptoms. Modifying factors include ice, aleve, and heating pad which did not help very much. Previously tried the treadmill in the past and did not tolerate it well. Switched to aquatic therapy which did help with her ability to exercise. This was the first week that she was on the treadmill since stopping water aerobics. Continues to experience some sciatica down the right side depending on her activity.   Allergies  Allergen Reactions  . Biaxin [Clarithromycin] Hives  . Iodine Hives    Current Outpatient Prescriptions on File Prior to Visit  Medication Sig Dispense Refill  . adapalene (DIFFERIN) 0.1 % gel Apply topically at bedtime. 45 g 0  . cloNIDine (CATAPRES) 0.1 MG tablet TAKE 1 TABLET BY MOUTH EVERY MORNING 90 tablet 0  . cyclobenzaprine (FLEXERIL) 10 MG tablet Take 1 tablet by mouth 3 (three) times daily as needed. Muscle spasms  1  . ibuprofen (ADVIL,MOTRIN) 800 MG tablet Take 1 tablet (800 mg total) by mouth 3 (three) times daily. 21 tablet 0  . lisinopril (PRINIVIL,ZESTRIL) 10 MG tablet TAKE 1 TABLET(10 MG) BY MOUTH DAILY 90 tablet 2  . naproxen (NAPROSYN) 500 MG tablet Take 1 tablet (500 mg total) by mouth 2 (two) times daily. 30 tablet 0  . predniSONE (DELTASONE) 10 MG tablet Take 6 on day 1 Take 5 on day 2 Take 4 on day 3 Take 3 on day 4 Take 2  on day 5 Take 1 on day 6 15 tablet 0  . ZESTRIL 10 MG tablet TAKE 1 TABLET(10 MG) BY MOUTH DAILY 30 tablet 0   No current facility-administered medications on file prior to visit.     Past Surgical History  Procedure Laterality Date  . Ectopic pregnancy surgery      Review of Systems  Constitutional: Negative for fever and chills.  Musculoskeletal:       Positive for lower extremity pain.   Neurological: Negative for weakness and numbness.      Objective:    BP 128/80 mmHg  Pulse 70  Temp(Src) 98 F (36.7 C) (Oral)  Resp 16  Ht 5' 5.5" (1.664 m)  Wt 250 lb 6.4 oz (113.581 kg)  BMI 41.02 kg/m2  SpO2 99% Nursing note and vital signs reviewed.  Physical Exam  Constitutional: She is oriented to person, place, and time. She appears well-developed and well-nourished. No distress.  Cardiovascular: Normal rate, regular rhythm, normal heart sounds and intact distal pulses.   Pulmonary/Chest: Effort normal and breath sounds normal.  Musculoskeletal:  Left knee - no obvious deformity or discoloration. Mild anterior tenderness. No other tenderness able to be elicited. Range of motion and strength is normal. Ligamentous and meniscal testing is negative. Distal pulses and sensation are intact and appropriate.   Neurological: She  is alert and oriented to person, place, and time.  Skin: Skin is warm and dry.  Psychiatric: She has a normal mood and affect. Her behavior is normal. Judgment and thought content normal.       Assessment & Plan:   Problem List Items Addressed This Visit      Other   Knee pain, left - Primary    Continues to experience left knee pain resolved with decreased activity and most likely related to treadmill use. Recommend alternative cardiovascular including elliptical, cycling and aquatic therapy. Ice as needed for discomfort. Does have significant muscle imbalance with weak gluteus medius and increased genu valgum.  Continue to work on weight loss as she has  lost 40 pounds to this point. No indication for cortisone at present. Imaging if symptoms worsen.

## 2016-01-18 NOTE — Progress Notes (Signed)
Pre visit review using our clinic review tool, if applicable. No additional management support is needed unless otherwise documented below in the visit note. 

## 2016-01-21 NOTE — Telephone Encounter (Signed)
Pt stated that she would wait to see if if [facility] would deliver her BP meds. She had talked to them the other day and they apparently told her the meds would be there soon. Pt was supposed to call if she needed it resent

## 2016-01-25 ENCOUNTER — Other Ambulatory Visit: Payer: Self-pay | Admitting: Internal Medicine

## 2016-01-25 ENCOUNTER — Telehealth: Payer: Self-pay | Admitting: Internal Medicine

## 2016-01-25 NOTE — Telephone Encounter (Signed)
Please advise, thanks.

## 2016-01-25 NOTE — Telephone Encounter (Signed)
Pt request refill for adapalene (DIFFERIN) 0.1 % gel to be send into [facility] on [address]. Please help, they are trying to get this refill but never heard anything from our office.

## 2016-01-25 NOTE — Telephone Encounter (Signed)
Dr. Okey Dupre already sent in.

## 2016-02-08 ENCOUNTER — Telehealth: Payer: Self-pay | Admitting: Internal Medicine

## 2016-02-08 DIAGNOSIS — R768 Other specified abnormal immunological findings in serum: Secondary | ICD-10-CM

## 2016-02-08 NOTE — Telephone Encounter (Signed)
Pt called in said that she needs a new  referral to rheumatologist .  She had one in and it expired

## 2016-02-09 NOTE — Telephone Encounter (Signed)
Has she already seen someone in rheumatology?

## 2016-02-09 NOTE — Telephone Encounter (Signed)
Patient called back.  States that she called Dr. Mayford Knife office and they refuse to see her.

## 2016-02-09 NOTE — Telephone Encounter (Signed)
Patient no showed with Dr. Chrissie Noa in rheumatology and they may not see her. She is calling the office to find out. If they will not see her, Dr. Okey Dupre will have to send in a new referral and she will have to go to another office.

## 2016-02-10 NOTE — Telephone Encounter (Signed)
Would you like to put in a referral to another office?

## 2016-02-11 ENCOUNTER — Ambulatory Visit (INDEPENDENT_AMBULATORY_CARE_PROVIDER_SITE_OTHER): Payer: Federal, State, Local not specified - PPO | Admitting: Internal Medicine

## 2016-02-11 ENCOUNTER — Encounter: Payer: Self-pay | Admitting: Internal Medicine

## 2016-02-11 DIAGNOSIS — M2550 Pain in unspecified joint: Secondary | ICD-10-CM | POA: Insufficient documentation

## 2016-02-11 DIAGNOSIS — I10 Essential (primary) hypertension: Secondary | ICD-10-CM

## 2016-02-11 DIAGNOSIS — M25531 Pain in right wrist: Secondary | ICD-10-CM | POA: Diagnosis not present

## 2016-02-11 DIAGNOSIS — M79601 Pain in right arm: Secondary | ICD-10-CM | POA: Insufficient documentation

## 2016-02-11 DIAGNOSIS — M069 Rheumatoid arthritis, unspecified: Secondary | ICD-10-CM | POA: Diagnosis not present

## 2016-02-11 MED ORDER — DICLOFENAC SODIUM 75 MG PO TBEC: 75.0000 mg | DELAYED_RELEASE_TABLET | Freq: Two times a day (BID) | ORAL | Status: DC | PRN

## 2016-02-11 MED ORDER — PREDNISONE 10 MG PO TABS: ORAL_TABLET | ORAL | Status: DC

## 2016-02-11 NOTE — Progress Notes (Addendum)
Subjective:    Patient ID: Katherine Browning, female    DOB: [birth year], 50 y.o.   MRN: [id number]  HPI  Here to f/u with family present, c/o 1 -2 wks onset mild to mod flare of right > left symmetrical pain to fingers and mcp's with swelling, some improved in last few days, but still significant in that she has much difficulty dressing and making fist or grasping due to the pain.  No hx of gout, fever, trauma.  Has + RA labs from July 2016 and referred to rheum.  Today is clearly not accepting of this potential diagnosis, never saw rheumatology and displays marked avoidance behavior today of discussion of the diagnosis and any negative ramifications.  Is willing to consider tx since pain not resolved, however, and states this time she will accept referral to rheum. Pt denies chest pain, increased sob or doe, wheezing, orthopnea, PND, increased LE swelling, palpitations, dizziness or syncope.   Pt denies polydipsia, polyuria  Also points to right radail styloid process which has bony enlargement compared to left, not clear when this may have occurred, no pain, just has not noticed before. Past Medical History  Diagnosis Date  . Back pain   . Hypertension   . Knee pain    Past Surgical History  Procedure Laterality Date  . Ectopic pregnancy surgery      reports that she has never smoked. She does not have any smokeless tobacco history on file. She reports that she does not drink alcohol or use illicit drugs. family history includes Hypertension in her other. Allergies  Allergen Reactions  . Biaxin [Clarithromycin] Hives  . Iodine Hives   Current Outpatient Prescriptions on File Prior to Visit  Medication Sig Dispense Refill  . adapalene (DIFFERIN) 0.1 % gel APPLY EXTERNALLY TO THE AFFECTED AREA AT BEDTIME 45 g 0  . cloNIDine (CATAPRES) 0.1 MG tablet TAKE 1 TABLET BY MOUTH EVERY MORNING 90 tablet 0  . cyclobenzaprine (FLEXERIL) 10 MG tablet Take 1 tablet by mouth 3 (three) times daily as  needed. Muscle spasms  1  . ibuprofen (ADVIL,MOTRIN) 800 MG tablet Take 1 tablet (800 mg total) by mouth 3 (three) times daily. 21 tablet 0  . lisinopril (PRINIVIL,ZESTRIL) 10 MG tablet TAKE 1 TABLET(10 MG) BY MOUTH DAILY 90 tablet 2  . naproxen (NAPROSYN) 500 MG tablet Take 1 tablet (500 mg total) by mouth 2 (two) times daily. 30 tablet 0  . ZESTRIL 10 MG tablet TAKE 1 TABLET(10 MG) BY MOUTH DAILY 30 tablet 0   No current facility-administered medications on file prior to visit.   Review of Systems  Constitutional: Negative for unusual diaphoresis or night sweats HENT: Negative for ringing in ear or discharge Eyes: Negative for double vision or worsening visual disturbance.  Respiratory: Negative for choking and stridor.   Gastrointestinal: Negative for vomiting or other signifcant bowel change Genitourinary: Negative for hematuria or change in urine volume.  Musculoskeletal: Negative for other MSK pain or swelling Skin: Negative for color change and worsening wound.  Neurological: Negative for tremors and numbness other than noted  Psychiatric/Behavioral: Negative for decreased concentration or agitation other than above       Objective:   Physical Exam BP 128/78 mmHg  Pulse 88  Temp(Src) 98.7 F (37.1 C) (Oral)  Resp 20  Wt 253 lb (114.76 kg)  SpO2 98% VS noted,  Constitutional: Pt appears in no significant distress HENT: Head: NCAT.  Right Ear: External ear normal.  Left Ear: External  ear normal.  Eyes: . Pupils are equal, round, and reactive to light. Conjunctivae and EOM are normal Neck: Normal range of motion. Neck supple.  Cardiovascular: Normal rate and regular rhythm.   Pulmonary/Chest: Effort normal and breath sounds without rales or wheezing.  bilat hands with symmetrical mcp tender/swelling and bilat prox finger swelling first adn third fingers bilat Neurological: Pt is alert. Not confused , motor grossly intact Skin: Skin is warm. No rash, no LE  edema Psychiatric: Pt behavior is normal. No agitation. 2-3+ nervous  July 2016 Cyclic Citrullin Peptide Ab 0.0 - 5.0 U/mL 267.9 (H)       Sed Rate 0 - 22 mm/hr 49 (H)            Assessment & Plan:

## 2016-02-11 NOTE — Telephone Encounter (Signed)
Referral placed.

## 2016-02-11 NOTE — Progress Notes (Signed)
Pre visit review using our clinic review tool, if applicable. No additional management support is needed unless otherwise documented below in the visit note. 

## 2016-02-11 NOTE — Assessment & Plan Note (Signed)
High suspicion vs gout, improved some spontaneusly it seems in the last few days but still significant, declines depomedrol, for predpac asd, nsaid prn, refer rheumatology

## 2016-02-11 NOTE — Patient Instructions (Signed)
Please take all new medication as prescribed  - the prednisone as prescribed, and also the anti-inflammatory medication for pain as needed as well  You will be contacted regarding the referral for: Rheumatology  Please continue all other medications as before, and refills have been done if requested.  Please have the pharmacy call with any other refills you may need.  Please keep your appointments with your specialists as you may have planned

## 2016-02-11 NOTE — Assessment & Plan Note (Signed)
stable overall by history and exam, recent data reviewed with pt, and pt to continue medical treatment as before,  to f/u any worsening symptoms or concerns BP Readings from Last 3 Encounters:  02/11/16 128/78  01/18/16 128/80  09/30/15 151/99

## 2016-02-11 NOTE — Assessment & Plan Note (Signed)
With some bony different in the radial styloid process areas right > left, appears benign and unrelated, possible OA related, for same tx,  to f/u any worsening symptoms or concerns

## 2016-02-12 ENCOUNTER — Telehealth: Payer: Self-pay | Admitting: Internal Medicine

## 2016-03-02 ENCOUNTER — Telehealth: Payer: Self-pay | Admitting: Internal Medicine

## 2016-03-02 NOTE — Telephone Encounter (Signed)
Pt is requesting a refill on her predniSONE Rx.   CB: [phone]

## 2016-03-02 NOTE — Telephone Encounter (Signed)
Patient says she takes it when her sciatica and when it flares up she needs prednisone. It is flaring up and she would like it filled. She has a bad attitude. She says she is trying to avoid going to the ER, or paying for another office visit. Please advise, thanks.

## 2016-03-02 NOTE — Telephone Encounter (Signed)
Pt would like to have Rx go to :    Pharmacy : [id number] - [place], [place] - [address] AT [facility] OF [address]

## 2016-03-02 NOTE — Telephone Encounter (Signed)
Pt called in to speak back with CMA.    CB: [phone]

## 2016-03-02 NOTE — Telephone Encounter (Signed)
I have never prescribed this for her and would need to evaluate the problem to see if this is appropriate. Will not fill.

## 2016-03-02 NOTE — Telephone Encounter (Signed)
This medicine should not be refilled and she would need a visit.

## 2016-03-03 NOTE — Telephone Encounter (Signed)
Patient aware and will schedule an office visit.  

## 2016-04-25 ENCOUNTER — Telehealth: Payer: Self-pay | Admitting: Internal Medicine

## 2016-04-25 DIAGNOSIS — M2550 Pain in unspecified joint: Secondary | ICD-10-CM

## 2016-04-25 NOTE — Telephone Encounter (Signed)
Pt called request another referral for Rheumatologist: Dr. Corliss Skains, Phone # [phone] for 2nd opinion. Please advise.

## 2016-04-26 NOTE — Telephone Encounter (Signed)
Referral done

## 2016-05-10 ENCOUNTER — Ambulatory Visit (INDEPENDENT_AMBULATORY_CARE_PROVIDER_SITE_OTHER): Payer: Federal, State, Local not specified - PPO | Admitting: Internal Medicine

## 2016-05-10 ENCOUNTER — Encounter: Payer: Self-pay | Admitting: Internal Medicine

## 2016-05-10 VITALS — BP 142/92 | HR 92 | Temp 98.7°F | Resp 14 | Ht 65.5 in | Wt 240.0 lb

## 2016-05-10 DIAGNOSIS — I10 Essential (primary) hypertension: Secondary | ICD-10-CM | POA: Diagnosis not present

## 2016-05-10 DIAGNOSIS — M069 Rheumatoid arthritis, unspecified: Secondary | ICD-10-CM | POA: Diagnosis not present

## 2016-05-10 MED ORDER — ACETAMINOPHEN-CODEINE #3 300-30 MG PO TABS: 1.0000 | ORAL_TABLET | Freq: Three times a day (TID) | ORAL | Status: DC | PRN

## 2016-05-10 MED ORDER — PREDNISONE 20 MG PO TABS: 20.0000 mg | ORAL_TABLET | Freq: Every day | ORAL | Status: DC

## 2016-05-10 NOTE — Progress Notes (Signed)
   Subjective:    Patient ID: Katherine Browning, female    DOB: October 16, 1966, 50 y.o.   MRN: [id number]  HPI The patient is a 50 YO female coming in with several concerns today. She is unhappy about not hearing more information about her rheumatoid arthritis. She did not come back for visit to discuss and missed her first appointment with her rheumatologist so did not get follow up on this condition for many months. She is taking some ibuprofen for the pain now and is still having pain in her wrist and her knee. She has seen a rheumatologist and they talked to her about treatment and she felt overwhelmed and did not like the doctor and so is waiting to see another rheumatologist. She denies flare with redness or swelling of her joints. More pain in her wrist in the last several weeks.   Review of Systems  Constitutional: Negative for fever, activity change, appetite change, fatigue and unexpected weight change.  HENT: Negative.   Respiratory: Negative for cough, chest tightness, shortness of breath and wheezing.   Cardiovascular: Negative for chest pain, palpitations and leg swelling.  Gastrointestinal: Negative for abdominal pain, diarrhea, constipation and abdominal distention.  Musculoskeletal: Positive for myalgias and arthralgias. Negative for back pain and gait problem.  Skin: Negative.   Neurological: Negative.  Negative for weakness, numbness and headaches.  Psychiatric/Behavioral: Negative.       Objective:   Physical Exam  Constitutional: She is oriented to person, place, and time. She appears well-developed and well-nourished.  HENT:  Head: Normocephalic and atraumatic.  Mouth/Throat: Oropharynx is clear and moist.  Eyes: EOM are normal.  Neck: Normal range of motion.  Cardiovascular: Normal rate and regular rhythm.   Pulmonary/Chest: Effort normal and breath sounds normal. No respiratory distress. She has no wheezes. She has no rales.  Abdominal: Soft. Bowel sounds are normal. She  exhibits no distension. There is no tenderness. There is no rebound.  Musculoskeletal:  Some mild swelling in the right wrist area. Pain on palpation of the left knee  Neurological: She is alert and oriented to person, place, and time.  Skin: Skin is warm and dry.   Filed Vitals:   05/10/16 1005 05/10/16 1230  BP: 150/100 142/92  Pulse: 92   Temp: 98.7 F (37.1 C)   TempSrc: Oral   Resp: 14   Height: 5' 5.5" (1.664 m)   Weight: 240 lb (108.863 kg)   SpO2: 90%       Assessment & Plan:

## 2016-05-10 NOTE — Assessment & Plan Note (Signed)
She is taking her lisinopril and declines increase to that today. She states missed a couple doses and wants to stop NSAIDs which could be elevating her pressure some.

## 2016-05-10 NOTE — Progress Notes (Signed)
Pre visit review using our clinic review tool, if applicable. No additional management support is needed unless otherwise documented below in the visit note. 

## 2016-05-10 NOTE — Assessment & Plan Note (Signed)
Rx for prednisone today and talked to her about the importance of controller medicine to help avoid damage to the joints over time. We also talked about how although prednisone helps it is not a good long term medicine for RA due to the significant side effects with weight gain, bone loss and immunosuppression. She has had referral to Dr. Link Snuffer and will discuss with her about her RA. Rx for tylenol #3 since she declines to take NSAIDs due to concerns about her blood pressure and heart disease.

## 2016-05-10 NOTE — Patient Instructions (Addendum)
We have sent in prednisone to take 1 pill daily for the next two weeks and we will get the rheumatology referral sorted out.   We do recommend to still take ibuprofen for the pain as needed up to 3 times per day. You can take the over the counter one and take 400 mg up to three times per day.   We have given you a prescription for tylenol #3 which is also something that you can try.

## 2016-05-16 ENCOUNTER — Other Ambulatory Visit (INDEPENDENT_AMBULATORY_CARE_PROVIDER_SITE_OTHER): Payer: Federal, State, Local not specified - PPO

## 2016-05-16 DIAGNOSIS — I10 Essential (primary) hypertension: Secondary | ICD-10-CM

## 2016-05-16 LAB — COMPREHENSIVE METABOLIC PANEL
ALT: 6 U/L (ref 0–35)
AST: 8 U/L (ref 0–37)
Albumin: 3.8 g/dL (ref 3.5–5.2)
Alkaline Phosphatase: 50 U/L (ref 39–117)
BUN: 11 mg/dL (ref 6–23)
CALCIUM: 9.5 mg/dL (ref 8.4–10.5)
CO2: 29 meq/L (ref 19–32)
Chloride: 105 mEq/L (ref 96–112)
Creatinine, Ser: 0.78 mg/dL (ref 0.40–1.20)
GFR: 101.22 mL/min (ref >60.00)
Glucose, Bld: 79 mg/dL (ref 70–99)
Potassium: 3.6 mEq/L (ref 3.5–5.1)
Sodium: 139 mEq/L (ref 135–145)
Total Bilirubin: 0.4 mg/dL (ref 0.2–1.2)
Total Protein: 7.1 g/dL (ref 6.0–8.3)

## 2016-05-16 LAB — CBC
HEMATOCRIT: 36.9 % (ref 36.0–46.0)
HEMOGLOBIN: 12.1 g/dL (ref 12.0–15.0)
MCHC: 32.8 g/dL (ref 30.0–36.0)
MCV: 87.6 fl (ref 78.0–100.0)
Platelets: 230 10*3/uL (ref 150.0–400.0)
RBC: 4.21 Mil/uL (ref 3.87–5.11)
RDW: 16.8 % — AB (ref 11.5–15.5)
WBC: 6.9 10*3/uL (ref 4.0–10.5)

## 2016-06-18 ENCOUNTER — Emergency Department (HOSPITAL_COMMUNITY)
Admission: EM | Admit: 2016-06-18 | Discharge: 2016-06-18 | Disposition: A | Discharge status: Home / Self Care | Payer: Federal, State, Local not specified - PPO | Attending: Emergency Medicine | Admitting: Emergency Medicine

## 2016-06-18 ENCOUNTER — Encounter (HOSPITAL_COMMUNITY): Payer: Self-pay | Admitting: *Deleted

## 2016-06-18 DIAGNOSIS — Z79899 Other long term (current) drug therapy: Secondary | ICD-10-CM | POA: Insufficient documentation

## 2016-06-18 DIAGNOSIS — I10 Essential (primary) hypertension: Secondary | ICD-10-CM | POA: Diagnosis not present

## 2016-06-18 DIAGNOSIS — H04123 Dry eye syndrome of bilateral lacrimal glands: Secondary | ICD-10-CM | POA: Diagnosis not present

## 2016-06-18 DIAGNOSIS — M436 Torticollis: Secondary | ICD-10-CM | POA: Diagnosis not present

## 2016-06-18 DIAGNOSIS — L299 Pruritus, unspecified: Secondary | ICD-10-CM | POA: Insufficient documentation

## 2016-06-18 DIAGNOSIS — Z7952 Long term (current) use of systemic steroids: Secondary | ICD-10-CM | POA: Insufficient documentation

## 2016-06-18 DIAGNOSIS — M542 Cervicalgia: Secondary | ICD-10-CM | POA: Diagnosis present

## 2016-06-18 MED ORDER — CYCLOSPORINE 0.05 % OP EMUL: 1.0000 [drp] | Freq: Two times a day (BID) | OPHTHALMIC | 0 refills | Status: DC

## 2016-06-18 MED ORDER — TRAMADOL HCL 50 MG PO TABS: 50.0000 mg | ORAL_TABLET | Freq: Four times a day (QID) | ORAL | 0 refills | Status: DC | PRN

## 2016-06-18 MED ORDER — CIPROFLOXACIN-DEXAMETHASONE 0.3-0.1 % OT SUSP: 4.0000 [drp] | Freq: Two times a day (BID) | OTIC | 0 refills | Status: DC

## 2016-06-18 MED ORDER — CYCLOBENZAPRINE HCL 10 MG PO TABS: 10.0000 mg | ORAL_TABLET | Freq: Every day | ORAL | 0 refills | Status: DC

## 2016-06-18 MED ORDER — HYDROCODONE-ACETAMINOPHEN 5-325 MG PO TABS: 2.0000 | ORAL_TABLET | ORAL | 0 refills | Status: DC | PRN

## 2016-06-18 MED ORDER — NAPHAZOLINE-PHENIRAMINE 0.025-0.3 % OP SOLN: 1.0000 [drp] | OPHTHALMIC | 0 refills | Status: DC | PRN

## 2016-06-18 NOTE — ED Provider Notes (Signed)
[facility]-EMERGENCY DEPT Provider Note   CSN: [id number] Arrival date & time: 06/18/16  1114  First Provider Contact:  None    By signing my name below, I, Linna Darner, attest that this documentation has been prepared under the direction and in the presence of [employer], PA-C. Electronically Signed: Linna Darner, Scribe. 06/18/2016. 12:49 PM.   History   Chief Complaint Chief Complaint  Patient presents with  . Itchy Eye  . Neck Pain    The history is provided by the patient. No language interpreter was used.     HPI Comments: Katherine Browning is a 50 y.o. female who presents to the Emergency Department complaining of sudden onset, constant, burning, bilateral eye pruritis for the last two days. PMH significant for RA. Pt states that both of her eyes feel dry for the past several days. She states it feels like something is in her eyes. Pt notes she has experienced some occasional eye discharge ("mucous") and has had some mild blurry vision and eye redness since onset. She has tried OTC eyedrops with no relief. Denies dry mouth. She reports that both of her ears feel "itchy", more significantly in the right ear. Pt also complains of sudden onset, constant, right-sided neck pain and stiffness for the past two days. She states these symptoms are exacerbated with twisting her neck. Pt also states she has had some occasional left-sided neck stiffness since onset as well. She has tried [employer] for her neck symptoms with no relief. Pt wears glasses but denies contact use. She denies h/o environmental allergies. She further denies fever, eye pain, ear pain, or any other associated symptoms.   Past Medical History:  Diagnosis Date  . Back pain   . Hypertension   . Knee pain     Patient Active Problem List   Diagnosis Date Noted  . Acute rheumatoid arthritis (HCC) 02/11/2016  . Right wrist pain 02/11/2016  . Arthritis 05/01/2015  . Morbid obesity (HCC) 10/27/2014  . Essential hypertension  10/27/2014  . Knee pain, left 10/27/2014  . Routine general medical examination at a health care facility 10/27/2014  . Acne 10/27/2014    Past Surgical History:  Procedure Laterality Date  . ECTOPIC PREGNANCY SURGERY      OB History    No data available       Home Medications    Prior to Admission medications   Medication Sig Start Date End Date Taking? Authorizing Provider  acetaminophen-codeine (TYLENOL #3) 300-30 MG tablet Take 1 tablet by mouth every 8 (eight) hours as needed for moderate pain. 05/10/16   Myrlene Broker, MD  cloNIDine (CATAPRES) 0.1 MG tablet TAKE 1 TABLET BY MOUTH EVERY MORNING 07/22/15   Myrlene Broker, MD  cyclobenzaprine (FLEXERIL) 10 MG tablet Take 1 tablet by mouth 3 (three) times daily as needed. Muscle spasms 07/30/15   Historical Provider, MD  ibuprofen (ADVIL,MOTRIN) 800 MG tablet Take 1 tablet (800 mg total) by mouth 3 (three) times daily. 06/06/15   Ladona Mow, PA-C  predniSONE (DELTASONE) 20 MG tablet Take 1 tablet (20 mg total) by mouth daily with breakfast. 05/10/16   Myrlene Broker, MD  ZESTRIL 10 MG tablet TAKE 1 TABLET(10 MG) BY MOUTH DAILY 11/06/15   Myrlene Broker, MD    Family History Family History  Problem Relation Age of Onset  . Hypertension Other     Social History Social History  Substance Use Topics  . Smoking status: Never Smoker  . Smokeless tobacco:  Never Used  . Alcohol use No     Comment: occ     Allergies   Biaxin [clarithromycin] and Iodine   Review of Systems Review of Systems  Constitutional: Negative for fever.  HENT: Negative for ear discharge and ear pain.        Ear itching  Eyes: Positive for discharge, redness, itching and visual disturbance (blurry vision). Negative for pain.  Musculoskeletal: Positive for neck pain and neck stiffness.   Physical Exam Updated Vital Signs BP (!) 169/109 (BP Location: Left Arm)   Pulse 95   Temp 98.5 F (36.9 C) (Oral)   Resp 18   LMP  05/19/2016   SpO2 100%   Physical Exam  Constitutional: She is oriented to person, place, and time. She appears well-developed and well-nourished. No distress.  HENT:  Head: Normocephalic and atraumatic.  Right Ear: Hearing, tympanic membrane, external ear and ear canal normal.  Left Ear: Hearing, tympanic membrane, external ear and ear canal normal.  Eyes: Conjunctivae, EOM and lids are normal. Pupils are equal, round, and reactive to light. Lids are everted and swept, no foreign bodies found. Right eye exhibits no discharge and no exudate. No foreign body present in the right eye. Left eye exhibits no discharge and no exudate. No foreign body present in the left eye. Right conjunctiva is not injected. Left conjunctiva is not injected. Right eye exhibits normal extraocular motion. Left eye exhibits normal extraocular motion.  Neck: Normal range of motion. Neck supple. No spinous process tenderness and no muscular tenderness present. No neck rigidity. No tracheal deviation and normal range of motion present.  Subjective pain with ROM of neck  Cardiovascular: Normal rate.   Pulmonary/Chest: Effort normal. No respiratory distress.  Musculoskeletal: Normal range of motion.  Neurological: She is alert and oriented to person, place, and time.  Skin: Skin is warm and dry.  Psychiatric: Her speech is normal. Her affect is angry. She is agitated. She expresses inappropriate judgment.  Nursing note and vitals reviewed.   ED Treatments / Results  Labs (all labs ordered are listed, but only abnormal results are displayed) Labs Reviewed - No data to display  EKG  EKG Interpretation None       Radiology No results found.  Procedures Procedures (including critical care time)  DIAGNOSTIC STUDIES: Oxygen Saturation is 100% on RA, normal by my interpretation.    COORDINATION OF CARE: 12:49 PM Discussed treatment plan with pt at bedside and pt agreed to plan.   Medications Ordered in  ED Medications - No data to display   Initial Impression / Assessment and Plan / ED Course  I have reviewed the triage vital signs and the nursing notes.  Pertinent labs & imaging results that were available during my care of the patient were reviewed by me and considered in my medical decision making (see chart for details).  Clinical Course   50 year old female who presents with dry eyes, ear itching, and bilateral neck pain. She appears NAD however is very angry and irritable during H&P and is not straightforward with describing her symptoms. Her eyes and ear do not appear infected. Antibiotics are not indicated. She does have a history of +RA labs so it is possible this is Sjogrens as she has been treated with allergy medicine in the past with no relief. She is demanding medicines other than the ones she has been using at home because they have not provided relief. Will rx Topical cyclosporine for suspected Sjogren's,  tramadol and a muscle relaxer for torticollis, and ciprodex to relieve itching and cover for a possible infection although this is unlikely. Patient is NAD, non-toxic, with stable VS. Safe for d/c  I personally performed the services described in this documentation, which was scribed in my presence. The recorded information has been reviewed and is accurate.   Final Clinical Impressions(s) / ED Diagnoses   Final diagnoses:  Torticollis  Dry eye, bilateral  Itching of ear    New Prescriptions Discharge Medication List as of 06/18/2016  1:36 PM       Bethel Born, PA-C 06/18/16 1613    Shaune Pollack, MD 06/18/16 1017    Shaune Pollack, MD 06/18/16 5102    Shaune Pollack, MD 06/18/16 2200

## 2016-06-18 NOTE — ED Notes (Signed)
Bed: WA28 Expected date:  Expected time:  Means of arrival:  Comments: 

## 2016-06-18 NOTE — Care Management (Signed)
Spoke with [employer] who is searching for prior Authorization for Restasis medication as it is not covered by her [employer]. Pt is not picking up her other medication as it is not covered and her copay is "too high". There is no substitute for Restasis other than those that are OTC. Pharmacist will be suggesting she use something OTC until she can see her PCP and have this medication substituted or authorized through them. No further CM needs at this time.

## 2016-06-18 NOTE — ED Triage Notes (Signed)
Pt complains of itching/irrtation in both eyes and right ear for the past 2 days. Pt also complains of right neck pain for the past 2 days, pain is worse when turning neck. Pt states she tried eye drops with no relief. Pt denies injury to eye/ear. Pt denies nasal congestion /cough.

## 2016-06-20 ENCOUNTER — Telehealth: Payer: Self-pay

## 2016-06-20 NOTE — Telephone Encounter (Signed)
Patient called very upset off the start because she is unsure who called her and who she needed to be calling back. She stated a care manger or a nurse or maybe even something to do with a referral had called her about some kind of an app. Can someone who knows something about what she maybe calling her please call her back and get this fixed. Thank you.

## 2016-06-21 NOTE — Telephone Encounter (Signed)
Spoke to patient and she said the person contacted her that she needed to talk to.

## 2016-07-19 ENCOUNTER — Telehealth: Payer: Self-pay | Admitting: *Deleted

## 2016-07-19 NOTE — Telephone Encounter (Signed)
Pt left msg on triage stating she is wanting some pain medication for her hand pain. Called pt back inform her before MD rx something for pain she will need ov. Pt states she need to check her daughter schedule and give [place] a call bck to make appt...Raechel Chute

## 2016-08-15 ENCOUNTER — Telehealth: Payer: Self-pay | Admitting: *Deleted

## 2016-08-15 MED ORDER — CLONIDINE HCL 0.1 MG PO TABS: 0.1000 mg | ORAL_TABLET | Freq: Every morning | ORAL | 0 refills | Status: DC

## 2016-08-15 NOTE — Telephone Encounter (Signed)
Pt left msg on triage Friday am @ 10:55 stating she is needing refill on her clonidine. Notified pt rx sent to [facility]...Raechel Chute

## 2016-08-24 ENCOUNTER — Encounter (HOSPITAL_COMMUNITY): Payer: Self-pay | Admitting: Emergency Medicine

## 2016-08-24 ENCOUNTER — Emergency Department (HOSPITAL_COMMUNITY)
Admission: EM | Admit: 2016-08-24 | Discharge: 2016-08-24 | Disposition: A | Discharge status: Home / Self Care | Payer: Federal, State, Local not specified - PPO | Attending: Emergency Medicine | Admitting: Emergency Medicine

## 2016-08-24 DIAGNOSIS — Y939 Activity, unspecified: Secondary | ICD-10-CM | POA: Diagnosis not present

## 2016-08-24 DIAGNOSIS — X58XXXA Exposure to other specified factors, initial encounter: Secondary | ICD-10-CM | POA: Insufficient documentation

## 2016-08-24 DIAGNOSIS — T161XXA Foreign body in right ear, initial encounter: Secondary | ICD-10-CM | POA: Insufficient documentation

## 2016-08-24 DIAGNOSIS — I10 Essential (primary) hypertension: Secondary | ICD-10-CM | POA: Insufficient documentation

## 2016-08-24 DIAGNOSIS — Z79899 Other long term (current) drug therapy: Secondary | ICD-10-CM | POA: Insufficient documentation

## 2016-08-24 DIAGNOSIS — Y929 Unspecified place or not applicable: Secondary | ICD-10-CM | POA: Insufficient documentation

## 2016-08-24 DIAGNOSIS — Y999 Unspecified external cause status: Secondary | ICD-10-CM | POA: Insufficient documentation

## 2016-08-24 MED ORDER — IBUPROFEN 200 MG PO TABS
600.0000 mg | ORAL_TABLET | Freq: Once | ORAL | Status: AC
  Administered 2016-08-24: 600 mg via ORAL
  Filled 2016-08-24: qty 3

## 2016-08-24 MED ORDER — LIDOCAINE HCL (PF) 1 % IJ SOLN
5.0000 mL | Freq: Once | INTRAMUSCULAR | Status: AC
  Administered 2016-08-24: 5 mL via INTRADERMAL
  Filled 2016-08-24: qty 30

## 2016-08-24 NOTE — ED Provider Notes (Signed)
[facility]-EMERGENCY DEPT Provider Note   CSN: [id number] Arrival date & time: 08/24/16  5397     History   Chief Complaint Chief Complaint  Patient presents with  . Otalgia    HPI Katherine Browning is a 50 y.o. female.  50 year old female with past medical history below who presents with foreign body in ear. The patient states that yesterday during the day she had a sudden feeling of fluttering in her ear. She thought that she had a hair in her ear and eventually the sensation subsided. His morning she began having the same fluttering feeling in her ear like something was in it. She put a Q-tip and to try to get it out and noticed some blood. She has had mild ear pain since then but  Denies any severe ear painleading up to yesterday.  She has seasonal allergy symptoms but denies any recent illness.   The history is provided by the patient.  Otalgia     Past Medical History:  Diagnosis Date  . Back pain   . Hypertension   . Knee pain     Patient Active Problem List   Diagnosis Date Noted  . Acute rheumatoid arthritis (HCC) 02/11/2016  . Right wrist pain 02/11/2016  . Arthritis 05/01/2015  . Morbid obesity (HCC) 10/27/2014  . Essential hypertension 10/27/2014  . Knee pain, left 10/27/2014  . Routine general medical examination at a health care facility 10/27/2014  . Acne 10/27/2014    Past Surgical History:  Procedure Laterality Date  . ECTOPIC PREGNANCY SURGERY      OB History    No data available       Home Medications    Prior to Admission medications   Medication Sig Start Date End Date Taking? Authorizing Provider  acetaminophen-codeine (TYLENOL #3) 300-30 MG tablet Take 1 tablet by mouth every 8 (eight) hours as needed for moderate pain. 05/10/16   Myrlene Broker, MD  ciprofloxacin-dexamethasone [facility]) otic suspension Place 4 drops into the right ear 2 (two) times daily. 06/18/16   Bethel Born, PA-C  cloNIDine (CATAPRES) 0.1 MG tablet Take 1  tablet (0.1 mg total) by mouth every morning. 08/15/16   Myrlene Broker, MD  cyclobenzaprine (FLEXERIL) 10 MG tablet Take 1 tablet (10 mg total) by mouth at bedtime. 06/18/16   Bethel Born, PA-C  cycloSPORINE (RESTASIS) 0.05 % ophthalmic emulsion Place 1 drop into both eyes 2 (two) times daily. 06/18/16   Bethel Born, PA-C  ibuprofen (ADVIL,MOTRIN) 800 MG tablet Take 1 tablet (800 mg total) by mouth 3 (three) times daily. 06/06/15   Ladona Mow, PA-C  predniSONE (DELTASONE) 20 MG tablet Take 1 tablet (20 mg total) by mouth daily with breakfast. 05/10/16   Myrlene Broker, MD  traMADol (ULTRAM) 50 MG tablet Take 1 tablet (50 mg total) by mouth every 6 (six) hours as needed. 06/18/16   Bethel Born, PA-C  ZESTRIL 10 MG tablet TAKE 1 TABLET(10 MG) BY MOUTH DAILY 11/06/15   Myrlene Broker, MD    Family History Family History  Problem Relation Age of Onset  . Hypertension Other     Social History Social History  Substance Use Topics  . Smoking status: Never Smoker  . Smokeless tobacco: Never Used  . Alcohol use No     Comment: occ     Allergies   Ivp dye [iodinated diagnostic agents]; Biaxin [clarithromycin]; Iodine; and Vicodin [hydrocodone-acetaminophen]   Review of Systems Review of Systems  HENT:  Positive for ear pain.    10 Systems reviewed and are negative for acute change except as noted in the HPI.   Physical Exam Updated Vital Signs BP 165/92 (BP Location: Right Arm)   Pulse 63   Temp 98 F (36.7 C) (Oral)   Resp 17   Ht 5\' 5"  (1.651 m)   Wt 240 lb (108.9 kg)   LMP 08/11/2016   SpO2 98%   BMI 39.94 kg/m   Physical Exam  Constitutional: She is oriented to person, place, and time. She appears well-developed and well-nourished. No distress.  HENT:  Head: Normocephalic and atraumatic.  Right Ear: Tympanic membrane normal.  Left Ear: Tympanic membrane and ear canal normal.  Spider at 4 o'clock position of right ear canal  Eyes:  Conjunctivae are normal.  Neck: Neck supple.  Neurological: She is alert and oriented to person, place, and time.  Skin: Skin is warm and dry.  Psychiatric: She has a normal mood and affect. Judgment normal.  Nursing note and vitals reviewed.    ED Treatments / Results  Labs (all labs ordered are listed, but only abnormal results are displayed) Labs Reviewed - No data to display  EKG  EKG Interpretation None       Radiology No results found.  Procedures Procedures (including critical care time)  Medications Ordered in ED Medications  lidocaine (PF) (XYLOCAINE) 1 % injection 5 mL (5 mLs Intradermal Given 08/24/16 0934)  ibuprofen (ADVIL,MOTRIN) tablet 600 mg (600 mg Oral Given 08/24/16 1105)     Initial Impression / Assessment and Plan / ED Course  I have reviewed the triage vital signs and the nursing notes.   Clinical Course   PT w/ fluttering/foreign body sensation of R ear, spider in canal on exam. Irrigated with lidocaine to kill insect, then irrigated w/ water and peroxide. Repeat inspection showed clean ear canal with no abnormal findings. TM intact. Pt discharged in satisfactory condition.  Final Clinical Impressions(s) / ED Diagnoses   Final diagnoses:  Acute foreign body of right ear canal, initial encounter    New Prescriptions Discharge Medication List as of 08/24/2016 11:00 AM       10/24/2016, MD 08/24/16 1423

## 2016-08-24 NOTE — ED Triage Notes (Signed)
Pt reports pain to right ear that started yesterday and then proceeded to worsen this morning and pt states she felt something fluttering in ear. No foreigin body observed in ear at time of triage.

## 2016-08-25 ENCOUNTER — Other Ambulatory Visit: Payer: Self-pay | Admitting: Internal Medicine

## 2016-08-25 DIAGNOSIS — Z1231 Encounter for screening mammogram for malignant neoplasm of breast: Secondary | ICD-10-CM

## 2016-09-01 ENCOUNTER — Ambulatory Visit: Payer: Federal, State, Local not specified - PPO

## 2016-09-21 ENCOUNTER — Encounter: Payer: Self-pay | Admitting: Internal Medicine

## 2016-09-21 ENCOUNTER — Ambulatory Visit (INDEPENDENT_AMBULATORY_CARE_PROVIDER_SITE_OTHER): Payer: Federal, State, Local not specified - PPO | Admitting: Internal Medicine

## 2016-09-21 DIAGNOSIS — K047 Periapical abscess without sinus: Secondary | ICD-10-CM

## 2016-09-21 MED ORDER — AMOXICILLIN 500 MG PO CAPS: 500.0000 mg | ORAL_CAPSULE | Freq: Three times a day (TID) | ORAL | 0 refills | Status: DC

## 2016-09-21 MED ORDER — CYCLOBENZAPRINE HCL 10 MG PO TABS: 10.0000 mg | ORAL_TABLET | Freq: Two times a day (BID) | ORAL | 2 refills | Status: DC | PRN

## 2016-09-21 MED ORDER — TRAMADOL HCL 50 MG PO TABS: 50.0000 mg | ORAL_TABLET | Freq: Four times a day (QID) | ORAL | 0 refills | Status: DC | PRN

## 2016-09-21 MED ORDER — IBUPROFEN 800 MG PO TABS: 800.0000 mg | ORAL_TABLET | Freq: Three times a day (TID) | ORAL | 3 refills | Status: DC | PRN

## 2016-09-21 NOTE — Patient Instructions (Signed)
We have sent in the refill of the medications you requested.   We have given you the medication for pain as well.   We have sent in the medicine amoxicillin for the tooth. Take 1 pill 3 times per day for 10 days. We recommend to take with food as it is easier on your stomach.

## 2016-09-21 NOTE — Progress Notes (Signed)
Pre visit review using our clinic review tool, if applicable. No additional management support is needed unless otherwise documented below in the visit note. 

## 2016-09-22 DIAGNOSIS — K047 Periapical abscess without sinus: Secondary | ICD-10-CM | POA: Insufficient documentation

## 2016-09-22 NOTE — Assessment & Plan Note (Signed)
Rx for amoxicillin and tramadol for pain. She was given a list of free or reduced cost dental clinics in the area.

## 2016-09-22 NOTE — Progress Notes (Signed)
   Subjective:    Patient ID: Katherine Browning, female    DOB: December 25, 1965, 50 y.o.   MRN: [id number]  HPI The patient is a 50 YO female coming in for tooth problem. She feels she has a tooth infection. She does not have dental insurance or care and no visit to dentist in a long time. She went at some time recently and was told she needed an oral surgeon and she cannot afford to see one. It hurts and she is having a hard time chewing on that side. She denies fevers, chills, problems swallowing or with her saliva.   Review of Systems  Constitutional: Positive for appetite change. Negative for chills, fatigue, fever and unexpected weight change.  HENT: Positive for dental problem. Negative for drooling, postnasal drip, sore throat and trouble swallowing.   Eyes: Negative.   Respiratory: Negative.   Cardiovascular: Negative.   Gastrointestinal: Negative.       Objective:   Physical Exam  Constitutional: She appears well-developed and well-nourished.  HENT:  Head: Normocephalic and atraumatic.  Right lower jaw with 2 teeth with redness and swelling at the base with abscess likely in the gums  Eyes: EOM are normal.  Neck: Normal range of motion.  Cardiovascular: Normal rate and regular rhythm.   Pulmonary/Chest: Effort normal and breath sounds normal.  Abdominal: Soft. She exhibits no distension. There is no tenderness.  Skin: Skin is warm and dry.   Vitals:   09/21/16 1119  BP: (!) 158/100  Pulse: 78  Resp: 16  Temp: 98.4 F (36.9 C)  TempSrc: Oral  Weight: 242 lb (109.8 kg)  Height: 5' 5.5" (1.664 m)      Assessment & Plan:

## 2016-10-11 ENCOUNTER — Telehealth: Payer: Self-pay

## 2016-10-11 MED ORDER — PREDNISONE 20 MG PO TABS: 40.0000 mg | ORAL_TABLET | Freq: Every day | ORAL | 0 refills | Status: DC

## 2016-10-11 NOTE — Telephone Encounter (Signed)
Pt called stating she is having a flare up of lower back pain due to Sciatica. She is requesting a Rx of Prednisone (origianlly prescribed in the ER 09/2015).  Okay to advise OV since you have never treated her for this?

## 2016-10-11 NOTE — Telephone Encounter (Signed)
Pt advised, Rx re-sent to [facility] on [address] per pt request

## 2016-10-11 NOTE — Telephone Encounter (Signed)
Sent in prednisone take 2 pills daily for 5 days and if no improvement needs visit.

## 2016-10-15 ENCOUNTER — Encounter (HOSPITAL_COMMUNITY): Payer: Self-pay | Admitting: Emergency Medicine

## 2016-10-15 ENCOUNTER — Emergency Department (HOSPITAL_BASED_OUTPATIENT_CLINIC_OR_DEPARTMENT_OTHER)
Admit: 2016-10-15 | Discharge: 2016-10-15 | Disposition: A | Discharge status: Home / Self Care | Payer: Federal, State, Local not specified - PPO | Attending: Emergency Medicine | Admitting: Emergency Medicine

## 2016-10-15 ENCOUNTER — Emergency Department (HOSPITAL_COMMUNITY)
Admission: EM | Admit: 2016-10-15 | Discharge: 2016-10-15 | Disposition: A | Discharge status: Home / Self Care | Payer: Federal, State, Local not specified - PPO | Attending: Emergency Medicine | Admitting: Emergency Medicine

## 2016-10-15 DIAGNOSIS — M79609 Pain in unspecified limb: Secondary | ICD-10-CM

## 2016-10-15 DIAGNOSIS — I10 Essential (primary) hypertension: Secondary | ICD-10-CM | POA: Diagnosis not present

## 2016-10-15 DIAGNOSIS — M79604 Pain in right leg: Secondary | ICD-10-CM | POA: Diagnosis not present

## 2016-10-15 DIAGNOSIS — M7989 Other specified soft tissue disorders: Secondary | ICD-10-CM

## 2016-10-15 MED ORDER — METHOCARBAMOL 500 MG PO TABS: 500.0000 mg | ORAL_TABLET | Freq: Two times a day (BID) | ORAL | 0 refills | Status: DC

## 2016-10-15 NOTE — ED Notes (Signed)
Called the operator, had them page Vascular

## 2016-10-15 NOTE — ED Notes (Addendum)
Pt ambulated to bathroom with no assistance.  

## 2016-10-15 NOTE — Discharge Instructions (Signed)
Return here for increased swelling of your leg, redness, fever, or any other problems.

## 2016-10-15 NOTE — Progress Notes (Addendum)
VASCULAR LAB PRELIMINARY  PRELIMINARY  PRELIMINARY  PRELIMINARY  Right lower extremity venous duplex completed.    Preliminary report:  There is no DVT, SVT, or Baker's cyst noted in the right lower extremity. There are multiple enlarged lymph nodes noted in the right thigh and inguinal area. Waveforms are pulsatile suggestive of fluid overload.   Gave report to Dr. Charlann Boxer, [facility], RVT 10/15/2016, 10:39 AM

## 2016-10-15 NOTE — ED Provider Notes (Signed)
[facility]EM[facility] DEPT Provider Note   CSN: [id number] Arrival date & time: 10/15/16  6213     History   Chief Complaint Chief Complaint  Patient presents with  . Leg Swelling    HPI Katherine Browning is a 50 y.o. female.  50 year old female presents with 2 day history of intermittent right distal thigh swelling. Denies any trauma. No prior history of DVT. Did have some pain initially which she said was from standing for prolonged periods of time. For now notes some swelling to the right inner distal thigh area extending to the knee. Denies any redness to the area. No fevers. No prior history of same. No treatment use prior to arrival      Past Medical History:  Diagnosis Date  . Back pain   . Hypertension   . Knee pain     Patient Active Problem List   Diagnosis Date Noted  . Dental infection 09/22/2016  . Acute rheumatoid arthritis (HCC) 02/11/2016  . Right wrist pain 02/11/2016  . Arthritis 05/01/2015  . Morbid obesity (HCC) 10/27/2014  . Essential hypertension 10/27/2014  . Knee pain, left 10/27/2014  . Routine general medical examination at a health care facility 10/27/2014  . Acne 10/27/2014    Past Surgical History:  Procedure Laterality Date  . ECTOPIC PREGNANCY SURGERY      OB History    No data available       Home Medications    Prior to Admission medications   Medication Sig Start Date End Date Taking? Authorizing Provider  amoxicillin (AMOXIL) 500 MG capsule Take 1 capsule (500 mg total) by mouth 3 (three) times daily. 09/21/16   Myrlene Broker, MD  cloNIDine (CATAPRES) 0.1 MG tablet Take 1 tablet (0.1 mg total) by mouth every morning. 08/15/16   Myrlene Broker, MD  cyclobenzaprine (FLEXERIL) 10 MG tablet Take 1 tablet (10 mg total) by mouth 2 (two) times daily as needed for muscle spasms. 09/21/16   Myrlene Broker, MD  ibuprofen (ADVIL,MOTRIN) 800 MG tablet Take 1 tablet (800 mg total) by mouth every 8 (eight) hours as needed.  09/21/16   Myrlene Broker, MD  predniSONE (DELTASONE) 20 MG tablet Take 2 tablets (40 mg total) by mouth daily with breakfast. 10/11/16   Myrlene Broker, MD  traMADol (ULTRAM) 50 MG tablet Take 1 tablet (50 mg total) by mouth every 6 (six) hours as needed. 09/21/16   Myrlene Broker, MD  ZESTRIL 10 MG tablet TAKE 1 TABLET(10 MG) BY MOUTH DAILY 11/06/15   Myrlene Broker, MD    Family History Family History  Problem Relation Age of Onset  . Hypertension Other     Social History Social History  Substance Use Topics  . Smoking status: Never Smoker  . Smokeless tobacco: Never Used  . Alcohol use No     Comment: occ     Allergies   Ivp dye [iodinated diagnostic agents]; Biaxin [clarithromycin]; Iodine; and Vicodin [hydrocodone-acetaminophen]   Review of Systems Review of Systems  All other systems reviewed and are negative.    Physical Exam Updated Vital Signs BP (!) 182/118 (BP Location: Right Arm)   Pulse 80   Temp 98.2 F (36.8 C) (Oral)   Resp 18   SpO2 100%   Physical Exam  Constitutional: She is oriented to person, place, and time. She appears well-developed and well-nourished.  Non-toxic appearance. No distress.  HENT:  Head: Normocephalic and atraumatic.  Eyes: Conjunctivae, EOM and lids are normal.  Pupils are equal, round, and reactive to light.  Neck: Normal range of motion. Neck supple. No tracheal deviation present. No thyroid mass present.  Cardiovascular: Normal rate, regular rhythm and normal heart sounds.  Exam reveals no gallop.   No murmur heard. Pulmonary/Chest: Effort normal and breath sounds normal. No stridor. No respiratory distress. She has no decreased breath sounds. She has no wheezes. She has no rhonchi. She has no rales.  Abdominal: Soft. Normal appearance and bowel sounds are normal. She exhibits no distension. There is no tenderness. There is no rebound and no CVA tenderness.  Musculoskeletal: Normal range of motion. She  exhibits no edema or tenderness.       Legs: Neurological: She is alert and oriented to person, place, and time. She has normal strength. No cranial nerve deficit or sensory deficit. GCS eye subscore is 4. GCS verbal subscore is 5. GCS motor subscore is 6.  Skin: Skin is warm and dry. No abrasion and no rash noted.  Psychiatric: She has a normal mood and affect. Her speech is normal and behavior is normal.  Nursing note and vitals reviewed.    ED Treatments / Results  Labs (all labs ordered are listed, but only abnormal results are displayed) Labs Reviewed - No data to display  EKG  EKG Interpretation None       Radiology No results found.  Procedures Procedures (including critical care time)  Medications Ordered in ED Medications - No data to display   Initial Impression / Assessment and Plan / ED Course  I have reviewed the triage vital signs and the nursing notes.  Pertinent labs & imaging results that were available during my care of the patient were reviewed by me and considered in my medical decision making (see chart for details).  Clinical Course     Doppler of lower extremity negative for DVT. Return precautions given  Final Clinical Impressions(s) / ED Diagnoses   Final diagnoses:  None    New Prescriptions New Prescriptions   No medications on file     Lorre Nick, MD 10/15/16 1052

## 2016-10-15 NOTE — ED Triage Notes (Signed)
Patient here from home with complaints of right leg swelling. Reports that swelling is mostly in the thigh and knee. Denies injury. History of hypertension. Ambulatory.

## 2016-10-15 NOTE — ED Notes (Signed)
Pt ambulated to bathroom with no assistance.  

## 2016-11-24 ENCOUNTER — Other Ambulatory Visit: Payer: Self-pay | Admitting: Internal Medicine

## 2016-11-30 ENCOUNTER — Telehealth: Payer: Self-pay

## 2016-11-30 NOTE — Telephone Encounter (Signed)
[facility] mail service sent in a request for zestril 20 mg, request was denied.  Patient is prescribed 10 mg

## 2016-12-01 ENCOUNTER — Emergency Department (HOSPITAL_COMMUNITY)
Admission: EM | Admit: 2016-12-01 | Discharge: 2016-12-01 | Disposition: A | Discharge status: Home / Self Care | Payer: Federal, State, Local not specified - PPO | Attending: Emergency Medicine | Admitting: Emergency Medicine

## 2016-12-01 ENCOUNTER — Encounter (HOSPITAL_COMMUNITY): Payer: Self-pay | Admitting: Emergency Medicine

## 2016-12-01 DIAGNOSIS — M545 Low back pain: Secondary | ICD-10-CM | POA: Diagnosis present

## 2016-12-01 DIAGNOSIS — Z79899 Other long term (current) drug therapy: Secondary | ICD-10-CM | POA: Insufficient documentation

## 2016-12-01 DIAGNOSIS — I10 Essential (primary) hypertension: Secondary | ICD-10-CM | POA: Diagnosis not present

## 2016-12-01 DIAGNOSIS — M5442 Lumbago with sciatica, left side: Secondary | ICD-10-CM | POA: Insufficient documentation

## 2016-12-01 MED ORDER — PREDNISONE 10 MG PO TABS: ORAL_TABLET | ORAL | Status: DC

## 2016-12-01 MED ORDER — DICLOFENAC SODIUM 75 MG PO TBEC: 75.0000 mg | DELAYED_RELEASE_TABLET | Freq: Two times a day (BID) | ORAL | 0 refills | Status: DC

## 2016-12-01 MED ORDER — METHOCARBAMOL 500 MG PO TABS: 500.0000 mg | ORAL_TABLET | Freq: Two times a day (BID) | ORAL | 0 refills | Status: DC

## 2016-12-01 NOTE — ED Triage Notes (Signed)
Per pt, states she has a history of herniated discs-states lower back pain radiating down left leg-has seen ortho but refused surgery

## 2016-12-01 NOTE — Discharge Instructions (Signed)
Return if any problems.

## 2016-12-01 NOTE — ED Provider Notes (Signed)
[facility] Provider Note   CSN: [id number] Arrival date & time: 12/01/16  6063  By signing my name below, I, Katherine Browning, attest that this documentation has been prepared under the direction and in the presence of Langston Masker PA-C.  Electronically Signed: Vista Browning, ED Scribe. 12/01/16. 11:02 AM.  History   Chief Complaint Chief Complaint  Patient presents with  . Back Pain   HPI HPI Comments: Katherine Browning is a 51 y.o. female, with Hx of chronic back pain due to bulging/hernaited discs, who presents to the Emergency Department complaining of constant left sided lower back pain that radiates down her left leg that started yesterday. Pt has been seen here before for similar symptoms with Hx of right sided lower back pain. She is currently experiencing pain to her left lower back which she has never had before. She has been seen by an orthopedic doctor within the past year but does not have money for physical therapy. Pt was told by ortho that she could have surgery for her bulging/herniated discs but has not chosen this route. Pt has taken one dose of tramadol, an unspecified muscle relaxer and ibuprofen this morning with only mild relief. She reports exacerbation of pain during ambulation and is having difficulty with this. No numbness or weakness.   The history is provided by the patient. No language interpreter was used.    Past Medical History:  Diagnosis Date  . Back pain   . Hypertension   . Knee pain     Patient Active Problem List   Diagnosis Date Noted  . Dental infection 09/22/2016  . Acute rheumatoid arthritis (HCC) 02/11/2016  . Right wrist pain 02/11/2016  . Arthritis 05/01/2015  . Morbid obesity (HCC) 10/27/2014  . Essential hypertension 10/27/2014  . Knee pain, left 10/27/2014  . Routine general medical examination at a health care facility 10/27/2014  . Acne 10/27/2014    Past Surgical History:  Procedure Laterality Date  . ECTOPIC PREGNANCY  SURGERY      OB History    No data available     Home Medications    Prior to Admission medications   Medication Sig Start Date End Date Taking? Authorizing Provider  cloNIDine (CATAPRES) 0.1 MG tablet Take 1 tablet (0.1 mg total) by mouth every morning. 08/15/16  Yes Myrlene Broker, MD  cyclobenzaprine (FLEXERIL) 10 MG tablet Take 1 tablet (10 mg total) by mouth 2 (two) times daily as needed for muscle spasms. 09/21/16  Yes Myrlene Broker, MD  ibuprofen (ADVIL,MOTRIN) 800 MG tablet Take 1 tablet (800 mg total) by mouth every 8 (eight) hours as needed. Patient taking differently: Take 800 mg by mouth every 8 (eight) hours as needed (For pain.).  09/21/16  Yes Myrlene Broker, MD  Menthol-Methyl Salicylate (MUSCLE RUB) 10-15 % CREA Apply 1 application topically as needed for muscle pain.   Yes Historical Provider, MD  traMADol (ULTRAM) 50 MG tablet Take 1 tablet (50 mg total) by mouth every 6 (six) hours as needed. Needs visit for refills Patient taking differently: Take 50 mg by mouth every 6 (six) hours as needed (For pain.).  11/25/16  Yes Myrlene Broker, MD  ZESTRIL 10 MG tablet TAKE 1 TABLET(10 MG) BY MOUTH DAILY 11/06/15  Yes Myrlene Broker, MD    Family History Family History  Problem Relation Age of Onset  . Hypertension Other     Social History Social History  Substance Use Topics  . Smoking status: Never  Smoker  . Smokeless tobacco: Never Used  . Alcohol use No     Comment: occ   Allergies   Ivp dye [iodinated diagnostic agents]; Biaxin [clarithromycin]; Iodine; and Vicodin [hydrocodone-acetaminophen]   Review of Systems Review of Systems  Musculoskeletal: Positive for back pain (lower left) and gait problem (due to pain).  Neurological: Negative for weakness and numbness.     Physical Exam Updated Vital Signs BP 163/98 (BP Location: Left Arm)   Pulse 103   Temp 98.2 F (36.8 C) (Oral)   Resp 22   Ht 5' 5.5" (1.664 m)   Wt 235 lb  (106.6 kg)   SpO2 100%   BMI 38.51 kg/m   Physical Exam  Constitutional: She is oriented to person, place, and time. She appears well-developed and well-nourished. No distress.  HENT:  Head: Normocephalic and atraumatic.  Neck: Normal range of motion.  Cardiovascular: Normal rate, regular rhythm and normal heart sounds.   Pulmonary/Chest: Effort normal and breath sounds normal.  Musculoskeletal: Normal range of motion. She exhibits tenderness.  Non tender L spine. Pain with ROM left leg.  Neurological: She is alert and oriented to person, place, and time.  Skin: Skin is warm and dry. She is not diaphoretic.  Psychiatric: She has a normal mood and affect. Judgment normal.  Nursing note and vitals reviewed.   ED Treatments / Results  DIAGNOSTIC STUDIES: Oxygen Saturation is 100% on RA, normal by my interpretation.  COORDINATION OF CARE: 11:00 AM-Discussed treatment plan with pt at bedside and pt agreed to plan.   Labs (all labs ordered are listed, but only abnormal results are displayed) Labs Reviewed - No data to display  EKG  EKG Interpretation None       Radiology No results found.  Procedures Procedures (including critical care time)  Medications Ordered in ED Medications - No data to display   Initial Impression / Assessment and Plan / ED Course  I have reviewed the triage vital signs and the nursing notes.  Pertinent labs & imaging results that were available during my care of the patient were reviewed by me and considered in my medical decision making (see chart for details).  Clinical Course       Final Clinical Impressions(s) / ED Diagnoses   Final diagnoses:  Acute low back pain with left-sided sciatica, unspecified back pain laterality    New Prescriptions Discharge Medication List as of 12/01/2016 10:58 AM    START taking these medications   Details  diclofenac (VOLTAREN) 75 MG EC tablet Take 1 tablet (75 mg total) by mouth 2 (two) times  daily., Starting Thu 12/01/2016, Print    methocarbamol (ROBAXIN) 500 MG tablet Take 1 tablet (500 mg total) by mouth 2 (two) times daily., Starting Thu 12/01/2016, Print    predniSONE (DELTASONE) 10 MG tablet 6,5,4,3,2,1 taper, Print      An After Visit Summary was printed and given to the patient.   Lonia Skinner [place], PA-C 12/01/16 1843    Loren Racer, MD 12/10/16 1136

## 2016-12-06 ENCOUNTER — Telehealth: Payer: Self-pay | Admitting: Internal Medicine

## 2017-01-03 ENCOUNTER — Other Ambulatory Visit: Payer: Self-pay | Admitting: Internal Medicine

## 2017-01-31 ENCOUNTER — Other Ambulatory Visit: Payer: Self-pay | Admitting: Internal Medicine

## 2017-02-03 NOTE — Telephone Encounter (Signed)
Patient is requesting medication.  States this is all [facility] carries.  Is requesting call back at [phone].

## 2017-02-06 MED ORDER — LISINOPRIL 10 MG PO TABS: 10.0000 mg | ORAL_TABLET | Freq: Every day | ORAL | 3 refills | Status: DC

## 2017-02-06 MED ORDER — LISINOPRIL 10 MG PO TABS: 10.0000 mg | ORAL_TABLET | Freq: Every day | ORAL | 0 refills | Status: DC

## 2017-02-06 NOTE — Telephone Encounter (Signed)
Patient needs lisinopril sent to:  [id number] - [place], [place] - [address] AT [facility] [address] [facility] & [address] [phone] (Phone) [phone] (Fax)   I transferred the call to [facility]

## 2017-02-06 NOTE — Telephone Encounter (Signed)
PT prescribed Zestril

## 2017-02-06 NOTE — Telephone Encounter (Signed)
I'm not sure what is wanted or asking?

## 2017-02-06 NOTE — Telephone Encounter (Signed)
patient is prescribed Zestril, but [facility] only has l[facility]ril, patient wanting prescription for lisinopril so she can get it from [facility]

## 2017-02-06 NOTE — Telephone Encounter (Signed)
Spoke to pt and she wants either Zestril (brand name) to [facility] or the generic Linsinopril to [facility] (local).     When speaking to the patient she stated that she was taking the lisinopril name brand and that the dosage that we have is not correct.  Tried to calm patient down and she hung up the phone on me.

## 2017-02-08 NOTE — Telephone Encounter (Signed)
Pt called in and said someone was going to send in a small script to local pharmacy for her bp meds.  Pt is very upset and said that she is being trying to get this med for a month.  She wants a call back today.    Best [phone]

## 2017-02-08 NOTE — Telephone Encounter (Signed)
Tried sending to local and unable to, don't know why but a thing pops up saying its in a future encounter that I can not reorder.  Please advise

## 2017-02-09 ENCOUNTER — Other Ambulatory Visit: Payer: Self-pay | Admitting: Internal Medicine

## 2017-02-09 MED ORDER — LISINOPRIL 10 MG PO TABS: 10.0000 mg | ORAL_TABLET | Freq: Every day | ORAL | 1 refills | Status: DC

## 2017-02-09 NOTE — Telephone Encounter (Signed)
Sent to local

## 2017-02-09 NOTE — Telephone Encounter (Signed)
Patient requests 90 days, is that ok? Please advise

## 2017-02-22 ENCOUNTER — Telehealth: Payer: Self-pay | Admitting: *Deleted

## 2017-02-22 NOTE — Telephone Encounter (Signed)
Pt left msg on triage requesting refills on her flexeril, tramdol and ibuprofen for her sciatica pain...Raechel Chute

## 2017-02-23 MED ORDER — IBUPROFEN 800 MG PO TABS: 800.0000 mg | ORAL_TABLET | Freq: Three times a day (TID) | ORAL | 0 refills | Status: DC | PRN

## 2017-02-23 MED ORDER — CYCLOBENZAPRINE HCL 10 MG PO TABS: 10.0000 mg | ORAL_TABLET | Freq: Two times a day (BID) | ORAL | 2 refills | Status: DC | PRN

## 2017-02-23 NOTE — Telephone Encounter (Signed)
Notified pt w/MD response. MD sent refills [facility] inform will res[facility]. Called [facility] to cancel rx's...Raechel Chute

## 2017-02-23 NOTE — Telephone Encounter (Signed)
Needs visit for refill on tramadol as was on her last refill. Sent in flexeril and ibuprofen.

## 2017-06-06 ENCOUNTER — Encounter: Payer: Self-pay | Admitting: Nurse Practitioner

## 2017-06-06 ENCOUNTER — Ambulatory Visit (INDEPENDENT_AMBULATORY_CARE_PROVIDER_SITE_OTHER): Payer: Federal, State, Local not specified - PPO | Admitting: Nurse Practitioner

## 2017-06-06 VITALS — BP 132/80 | HR 90 | Temp 98.3°F | Ht 65.5 in | Wt 246.0 lb

## 2017-06-06 DIAGNOSIS — K047 Periapical abscess without sinus: Secondary | ICD-10-CM

## 2017-06-06 DIAGNOSIS — G8929 Other chronic pain: Secondary | ICD-10-CM | POA: Diagnosis not present

## 2017-06-06 DIAGNOSIS — M545 Low back pain, unspecified: Secondary | ICD-10-CM

## 2017-06-06 DIAGNOSIS — I10 Essential (primary) hypertension: Secondary | ICD-10-CM

## 2017-06-06 MED ORDER — LISINOPRIL 10 MG PO TABS: 10.0000 mg | ORAL_TABLET | Freq: Every day | ORAL | 0 refills | Status: DC

## 2017-06-06 MED ORDER — AMOXICILLIN 500 MG PO CAPS: 500.0000 mg | ORAL_CAPSULE | Freq: Three times a day (TID) | ORAL | 0 refills | Status: DC

## 2017-06-06 MED ORDER — IBUPROFEN 800 MG PO TABS: 800.0000 mg | ORAL_TABLET | Freq: Three times a day (TID) | ORAL | 0 refills | Status: DC | PRN

## 2017-06-06 MED ORDER — CYCLOBENZAPRINE HCL 10 MG PO TABS: 10.0000 mg | ORAL_TABLET | Freq: Two times a day (BID) | ORAL | 0 refills | Status: DC | PRN

## 2017-06-06 NOTE — Progress Notes (Signed)
Subjective:  Patient ID: Katherine Browning, female    DOB: 1966/11/14  Age: 51 y.o. MRN: [id number]  CC: Sciatica (sciatica flare ups--yesterday/tooth abcess--going on for a while--no insurance cant get it pull/ meds refill)   HPI She want prescriptions sent [facility] due to low cost.  HTN: She is out of zestril at this time. She still has clonidine. She does not want labs drawn. She states she has been taking zestril 40mg  once a day, even though 10mg  was prescribed by Dr. .  Dental pain: Intermittent purulent drainage. Unable to have teeth removed at this time due to lack of insurance and finance.  Back pain: Chronic, intermittent. Bilateral lower back with no radiation. No injury. Some relief with motrin. Will like ibuprofen and flexeril refilled.  Outpatient Medications Prior to Visit  Medication Sig Dispense Refill  . cloNIDine (CATAPRES) 0.1 MG tablet TAKE 1 TABLET BY MOUTH EVERY MORNING 90 tablet 0  . cyclobenzaprine (FLEXERIL) 10 MG tablet Take 1 tablet (10 mg total) by mouth 2 (two) times daily as needed for muscle spasms. 60 tablet 2  . ibuprofen (ADVIL,MOTRIN) 800 MG tablet Take 1 tablet (800 mg total) by mouth every 8 (eight) hours as needed (For pain.). 30 tablet 0  . traMADol (ULTRAM) 50 MG tablet Take 1 tablet (50 mg total) by mouth every 6 (six) hours as needed. Needs visit for refills (Patient taking differently: Take 50 mg by mouth every 6 (six) hours as needed (For pain.). ) 25 tablet 0  . diclofenac (VOLTAREN) 75 MG EC tablet Take 1 tablet (75 mg total) by mouth 2 (two) times daily. (Patient not taking: Reported on 06/06/2017) 20 tablet 0  . lisinopril (PRINIVIL,ZESTRIL) 10 MG tablet Take 1 tablet (10 mg total) by mouth daily. (Patient not taking: Reported on 06/06/2017) 30 tablet 0  . lisinopril (PRINIVIL,ZESTRIL) 10 MG tablet TAKE 1 TABLET(10 MG) BY MOUTH DAILY (Patient not taking: Reported on 06/06/2017) 90 tablet 0  . Menthol-Methyl Salicylate  (MUSCLE RUB) 10-15 % CREA Apply 1 application topically as needed for muscle pain.    . methocarbamol (ROBAXIN) 500 MG tablet Take 1 tablet (500 mg total) by mouth 2 (two) times daily. (Patient not taking: Reported on 06/06/2017) 20 tablet 0  . predniSONE (DELTASONE) 10 MG tablet 6,5,4,3,2,1 taper (Patient not taking: Reported on 06/06/2017) 21 tablet o   No facility-administered medications prior to visit.     ROS Review of Systems  Constitutional: Negative.   HENT: Negative for ear pain, sinus pain and sore throat.   Respiratory: Negative for stridor.   Cardiovascular: Positive for leg swelling. Negative for chest pain and palpitations.  Gastrointestinal: Negative for constipation and diarrhea.  Musculoskeletal: Positive for back pain. Negative for falls.  Skin: Negative.   Neurological: Negative for dizziness and headaches.    Objective:  BP 132/80   Pulse 90   Temp 98.3 F (36.8 C)   Ht 5' 5.5" (1.664 m)   Wt 246 lb (111.6 kg)   SpO2 99%   BMI 40.31 kg/m   BP Readings from Last 3 Encounters:  06/06/17 132/80  12/01/16 163/98  10/15/16 (!) 181/116    Wt Readings from Last 3 Encounters:  06/06/17 246 lb (111.6 kg)  12/01/16 235 lb (106.6 kg)  09/21/16 242 lb (109.8 kg)    Physical Exam  Constitutional: She is oriented to person, place, and time. No distress.  HENT:  Right Ear: External ear normal.  Left Ear: External ear normal.  Nose:  Nose normal.  Mouth/Throat: Uvula is midline, oropharynx is clear and moist and mucous membranes are normal. She does not have dentures. No trismus in the jaw. Abnormal dentition. Dental caries present. No oropharyngeal exudate.  Pink and moist gums. Some missing teeth. Broken Tooth (right lower premolar) associated with tenderness. No submental or cervical or postauricular lymphadenopathy.  Neck: Normal range of motion. Neck supple.  Cardiovascular: Normal rate and normal heart sounds.   Pulmonary/Chest: Effort normal and breath  sounds normal.  Musculoskeletal: Normal range of motion. She exhibits edema.  Normal gait  Lymphadenopathy:    She has no cervical adenopathy.  Neurological: She is alert and oriented to person, place, and time.  Vitals reviewed.   Lab Results  Component Value Date   WBC 6.9 05/16/2016   HGB 12.1 05/16/2016   HCT 36.9 05/16/2016   PLT 230.0 05/16/2016   GLUCOSE 79 05/16/2016   CHOL 172 10/27/2014   TRIG 55.0 10/27/2014   HDL 51.50 10/27/2014   LDLCALC 110 (H) 10/27/2014   ALT 6 05/16/2016   AST 8 05/16/2016   NA 139 05/16/2016   K 3.6 05/16/2016   CL 105 05/16/2016   CREATININE 0.78 05/16/2016   BUN 11 05/16/2016   CO2 29 05/16/2016   TSH 1.19 10/27/2014   HGBA1C 5.5 04/28/2015    No results found.  Assessment & Plan:   Katherine Browning was seen today for sciatica.  Diagnoses and all orders for this visit:  Essential hypertension -     lisinopril (ZESTRIL) 10 MG tablet; Take 1 tablet (10 mg total) by mouth daily. -     Basic metabolic panel; Future  Dental infection -     amoxicillin (AMOXIL) 500 MG capsule; Take 1 capsule (500 mg total) by mouth 3 (three) times daily.  Chronic bilateral low back pain without sciatica -     ibuprofen (ADVIL,MOTRIN) 800 MG tablet; Take 1 tablet (800 mg total) by mouth every 8 (eight) hours as needed (For pain.). -     cyclobenzaprine (FLEXERIL) 10 MG tablet; Take 1 tablet (10 mg total) by mouth 2 (two) times daily as needed for muscle spasms.   I have discontinued [name]'s traMADol, MUSCLE RUB, predniSONE, methocarbamol, diclofenac, lisinopril, lisinopril, and lisinopril. I am also having her start on lisinopril and amoxicillin. Additionally, I am having her maintain her cloNIDine, ibuprofen, and cyclobenzaprine.  Meds ordered this encounter  Medications  . DISCONTD: lisinopril (ZESTRIL) 10 MG tablet    Sig: Take 10 mg by mouth daily.  Marland Kitchen ibuprofen (ADVIL,MOTRIN) 800 MG tablet    Sig: Take 1 tablet (800 mg total) by mouth every 8  (eight) hours as needed (For pain.).    Dispense:  30 tablet    Refill:  0    Order Specific Question:   Supervising Provider    Answer:   Tresa Garter [[id number]]  . cyclobenzaprine (FLEXERIL) 10 MG tablet    Sig: Take 1 tablet (10 mg total) by mouth 2 (two) times daily as needed for muscle spasms.    Dispense:  30 tablet    Refill:  0    Order Specific Question:   Supervising Provider    Answer:   Tresa Garter [[id number]]  . lisinopril (ZESTRIL) 10 MG tablet    Sig: Take 1 tablet (10 mg total) by mouth daily.    Dispense:  30 tablet    Refill:  0    Order Specific Question:   Supervising Provider    Answer:  PLOTNIKOV, ALEKSEI V [[id number]]  . amoxicillin (AMOXIL) 500 MG capsule    Sig: Take 1 capsule (500 mg total) by mouth 3 (three) times daily.    Dispense:  30 capsule    Refill:  0    Order Specific Question:   Supervising Provider    Answer:   Tresa Garter [[id number]]    Follow-up: Return if symptoms worsen or fail to improve.  Alysia Penna, NP

## 2017-06-06 NOTE — Patient Instructions (Addendum)
Ms. Katherine Browning was informed that Dr. Okey Dupre never prescribed zestril 40mg , hence she should maintain dose at 10mg  at this time. Ms. declined to have labs drawn today, stating she just wants her prescriptions. I explained the importance of having BMP done at least once a year while using lisinopril. She still declined labs today. I advised her to return to lab for blood draw within the next 30days, before additional refills will be sent.

## 2017-06-07 ENCOUNTER — Telehealth: Payer: Self-pay | Admitting: Internal Medicine

## 2017-06-07 DIAGNOSIS — K047 Periapical abscess without sinus: Secondary | ICD-10-CM

## 2017-06-09 MED ORDER — AMOXICILLIN 500 MG PO CAPS: 500.0000 mg | ORAL_CAPSULE | Freq: Three times a day (TID) | ORAL | 0 refills | Status: DC

## 2017-06-09 NOTE — Telephone Encounter (Signed)
Done

## 2017-06-09 NOTE — Telephone Encounter (Signed)
amoxicillin (AMOXIL) 500 MG capsule [[id number]]  This [facility] needs to [facility] to [facility] [address] asap  It was sent to mail order in error

## 2017-07-03 ENCOUNTER — Emergency Department (HOSPITAL_COMMUNITY)
Admission: EM | Admit: 2017-07-03 | Discharge: 2017-07-03 | Disposition: A | Discharge status: Home / Self Care | Payer: Federal, State, Local not specified - PPO | Attending: Emergency Medicine | Admitting: Emergency Medicine

## 2017-07-03 ENCOUNTER — Encounter (HOSPITAL_COMMUNITY): Payer: Self-pay | Admitting: Nurse Practitioner

## 2017-07-03 DIAGNOSIS — Y939 Activity, unspecified: Secondary | ICD-10-CM | POA: Diagnosis not present

## 2017-07-03 DIAGNOSIS — Y999 Unspecified external cause status: Secondary | ICD-10-CM | POA: Insufficient documentation

## 2017-07-03 DIAGNOSIS — Y92009 Unspecified place in unspecified non-institutional (private) residence as the place of occurrence of the external cause: Secondary | ICD-10-CM | POA: Insufficient documentation

## 2017-07-03 DIAGNOSIS — Z79899 Other long term (current) drug therapy: Secondary | ICD-10-CM | POA: Diagnosis not present

## 2017-07-03 DIAGNOSIS — W1830XA Fall on same level, unspecified, initial encounter: Secondary | ICD-10-CM | POA: Insufficient documentation

## 2017-07-03 DIAGNOSIS — I10 Essential (primary) hypertension: Secondary | ICD-10-CM | POA: Diagnosis not present

## 2017-07-03 DIAGNOSIS — W19XXXA Unspecified fall, initial encounter: Secondary | ICD-10-CM

## 2017-07-03 DIAGNOSIS — S39012A Strain of muscle, fascia and tendon of lower back, initial encounter: Secondary | ICD-10-CM | POA: Insufficient documentation

## 2017-07-03 DIAGNOSIS — M545 Low back pain: Secondary | ICD-10-CM | POA: Diagnosis present

## 2017-07-03 DIAGNOSIS — S8012XA Contusion of left lower leg, initial encounter: Secondary | ICD-10-CM | POA: Insufficient documentation

## 2017-07-03 DIAGNOSIS — T148XXA Other injury of unspecified body region, initial encounter: Secondary | ICD-10-CM

## 2017-07-03 NOTE — ED Triage Notes (Signed)
Pt states she fell earlier today injuring her back and left leg. Rating pain 7/10.

## 2017-07-03 NOTE — ED Provider Notes (Signed)
[facility]-EMERGENCY DEPT Provider Note   CSN: [id number] Arrival date & time: 07/03/17  0147     History   Chief Complaint Chief Complaint  Patient presents with  . Fall  . Back Pain    HPI Katherine Browning is a 51 y.o. female.  Patient states she fell while exiting a door way falling forward aggravating her known sciatica.  She states usually she has pain all around the left side, but now she's got bilateral pain.  She states she's been taking Flexeril and ibuprofen with some relief.  She is also concerned because she banged the shin of her left leg and now has a bruise. No loss of bowel or bladder control      Past Medical History:  Diagnosis Date  . Back pain   . Hypertension   . Knee pain     Patient Active Problem List   Diagnosis Date Noted  . Dental infection 09/22/2016  . Acute rheumatoid arthritis (HCC) 02/11/2016  . Right wrist pain 02/11/2016  . Arthritis 05/01/2015  . Morbid obesity (HCC) 10/27/2014  . Essential hypertension 10/27/2014  . Knee pain, left 10/27/2014  . Routine general medical examination at a health care facility 10/27/2014  . Acne 10/27/2014    Past Surgical History:  Procedure Laterality Date  . ECTOPIC PREGNANCY SURGERY      OB History    No data available       Home Medications    Prior to Admission medications   Medication Sig Start Date End Date Taking? Authorizing Provider  amoxicillin (AMOXIL) 500 MG capsule Take 1 capsule (500 mg total) by mouth 3 (three) times daily. 06/09/17   Plotnikov, Georgina Quint, MD  cloNIDine (CATAPRES) 0.1 MG tablet TAKE 1 TABLET BY MOUTH EVERY MORNING 06/07/17   Veryl Speak, FNP  cyclobenzaprine (FLEXERIL) 10 MG tablet Take 1 tablet (10 mg total) by mouth 2 (two) times daily as needed for muscle spasms. 06/06/17   Nche, Bonna Gains, NP  ibuprofen (ADVIL,MOTRIN) 800 MG tablet Take 1 tablet (800 mg total) by mouth every 8 (eight) hours as needed (For pain.). 06/06/17   Nche, Bonna Gains, NP    lisinopril (ZESTRIL) 10 MG tablet Take 1 tablet (10 mg total) by mouth daily. 06/06/17   Nche, Bonna Gains, NP    Family History Family History  Problem Relation Age of Onset  . Hypertension Other     Social History Social History  Substance Use Topics  . Smoking status: Never Smoker  . Smokeless tobacco: Never Used  . Alcohol use No     Comment: occ     Allergies   Ivp dye [iodinated diagnostic agents]; Biaxin [clarithromycin]; Iodine; and Vicodin [hydrocodone-acetaminophen]   Review of Systems Review of Systems  Genitourinary: Negative for decreased urine volume.  Musculoskeletal: Positive for back pain.  Skin: Positive for wound.  Neurological: Negative for weakness and numbness.  All other systems reviewed and are negative.    Physical Exam Updated Vital Signs BP (!) 168/108 (BP Location: Left Arm)   Pulse 70   Temp 98.1 F (36.7 C) (Oral)   Resp 18   SpO2 100%   Physical Exam  Constitutional: She appears well-developed and well-nourished. No distress.  Eyes: Pupils are equal, round, and reactive to light.  Neck: Normal range of motion.  Cardiovascular: Normal rate.   Pulmonary/Chest: Effort normal.  Musculoskeletal: Normal range of motion. She exhibits no deformity.       Legs: Observe patient ambulating.  She  walks without limp in an erect position  Nursing note and vitals reviewed.    ED Treatments / Results  Labs (all labs ordered are listed, but only abnormal results are displayed) Labs Reviewed - No data to display  EKG  EKG Interpretation None       Radiology No results found.  Procedures Procedures (including critical care time)  Medications Ordered in ED Medications - No data to display   Initial Impression / Assessment and Plan / ED Course  I have reviewed the triage vital signs and the nursing notes.  Pertinent labs & imaging results that were available during my care of the patient were reviewed by me and considered in  my medical decision making (see chart for details).      Patient has been instructed to continue taking her Flexeril and ibuprofen for her back pain.  She's been given an ice pack to apply cool compresses to the hematoma several times a day for the next 3-4 days  Final Clinical Impressions(s) / ED Diagnoses   Final diagnoses:  Strain of lumbar region, initial encounter  Fall in home, initial encounter  Hematoma    New Prescriptions New Prescriptions   No medications on file     Earley Favor, NP 07/03/17 9935    Derwood Kaplan, MD 07/04/17 [phone]

## 2017-07-03 NOTE — Discharge Instructions (Signed)
Apply ice to the hematoma several times a day for the next 2-3 days Continue taking her Flexeril and ibuprofen for back spasm back discomfort.  Follow-up with your PCP as needed

## 2017-07-19 ENCOUNTER — Ambulatory Visit (INDEPENDENT_AMBULATORY_CARE_PROVIDER_SITE_OTHER): Payer: Federal, State, Local not specified - PPO | Admitting: Family Medicine

## 2017-07-19 ENCOUNTER — Encounter: Payer: Self-pay | Admitting: Family Medicine

## 2017-07-19 DIAGNOSIS — K047 Periapical abscess without sinus: Secondary | ICD-10-CM

## 2017-07-19 MED ORDER — AMOXICILLIN 500 MG PO CAPS: 500.0000 mg | ORAL_CAPSULE | Freq: Three times a day (TID) | ORAL | 0 refills | Status: DC

## 2017-07-19 NOTE — Assessment & Plan Note (Signed)
This appears to be a recurring issue with her as she is not able to find a dentist that will be affordable. Has had antibiotics in a couple different occasions. She has significant decay of her teeth. - Amoxicillin today - Provided list of hardships clinics in town as well as the [facility] dental school - Counseled on the risk of tooth decay. - Can use NSAIDs for pain control.

## 2017-07-19 NOTE — Progress Notes (Signed)
Katherine Browning - 51 y.o. female MRN [id number]  Date of birth: July 15, 1966  SUBJECTIVE:  Including CC & ROS.  Chief Complaint  Patient presents with  . Dental Pain    patient is working on [employer] insurance to get teeth fixed and need antibiotics to help with the infection    Katherine Browning is a 51 year old female that is presenting with acute on chronic dental pain. She reports is occurring in the right lower aspect of her mouth. She is having active draining. The pain is worse with eating. She has not been taking any pain medications. Has tried ibuprofen. She was provided amoxicillin complete this course. In review of her chart she has been provided amoxicillin on 2 different occasions. She reports having financial hardships as to why she cannot see a dentist. She denies any fevers or chills. She has no trouble swallowing.    Review of her chart shows she was seen for a dental infection on 09/21/16. She is provided amoxicillin tramadol and given a list of free or reduced cost dental clinics in the area. Review of her CBC from 05/16/16 does not show any infection and review of her A1c from 04/28/15 shows 5.5.  Review of Systems  Constitutional: Negative for fever.  HENT: Positive for facial swelling. Negative for trouble swallowing.   Respiratory: Negative for shortness of breath.   Cardiovascular: Negative for chest pain.  Skin: Positive for color change.   otherwise negative  HISTORY: Past Medical, Surgical, Social, and Family History Reviewed & Updated per EMR.   Pertinent Historical Findings include:  Past Medical History:  Diagnosis Date  . Back pain   . Hypertension   . Knee pain     Past Surgical History:  Procedure Laterality Date  . ECTOPIC PREGNANCY SURGERY      Allergies  Allergen Reactions  . Ivp Dye [Iodinated Diagnostic Agents] Hives  . Biaxin [Clarithromycin] Hives and Nausea And Vomiting  . Iodine Hives  . Vicodin [Hydrocodone-Acetaminophen] Nausea And  Vomiting    Family History  Problem Relation Age of Onset  . Hypertension Other      Social History   Social History  . Marital status: Widowed    Spouse name: N/A  . Number of children: N/A  . Years of education: N/A   Occupational History  . Not on file.   Social History Main Topics  . Smoking status: Never Smoker  . Smokeless tobacco: Never Used  . Alcohol use No     Comment: occ  . Drug use: No  . Sexual activity: Not on file   Other Topics Concern  . Not on file   Social History Narrative  . No narrative on file     PHYSICAL EXAM:  VS: BP (!) 150/100 (BP Location: Left Arm, Patient Position: Sitting, Cuff Size: Large)   Pulse 85   Temp 98 F (36.7 C) (Oral)   Ht 5' 5.5" (1.664 m)   Wt 250 lb (113.4 kg)   SpO2 99%   BMI 40.97 kg/m  Physical Exam Gen: NAD, alert, cooperative with exam, well-appearing ENT: normal lips, normal nasal mucosa, abscess formation seen on the right lower mandible outer aspect of the gum, significant dental erosions in the lower mandible as well as the upper teeth, some right sided cheek swelling. No throat swelling, Eye: normal EOM, normal conjunctiva and lids CV:  no edema, +2 pedal pulses, regular rate and rhythm   Resp: no accessory muscle use, non-labored, clear to auscultation bilaterally  Skin: no rashes, no areas of induration  Neuro: normal tone, normal sensation to touch Psych:  normal insight, alert and oriented MSK: Normal gait, normal strength      ASSESSMENT & PLAN:   Dental infection This appears to be a recurring issue with her as she is not able to find a dentist that will be affordable. Has had antibiotics in a couple different occasions. She has significant decay of her teeth. - Amoxicillin today - Provided list of hardships clinics in town as well as the [facility] dental school - Counseled on the risk of tooth decay. - Can use NSAIDs for pain control.

## 2017-07-19 NOTE — Patient Instructions (Addendum)
Thank you for coming in,   Please take the antibiotic with a probiotic. Please have your teeth looked at soon.    Please feel free to call with any questions or concerns at any time, at [phone]. --Dr. Jordan Likes

## 2017-08-16 ENCOUNTER — Other Ambulatory Visit: Payer: Self-pay | Admitting: Internal Medicine

## 2017-10-03 ENCOUNTER — Encounter: Payer: Self-pay | Admitting: Internal Medicine

## 2017-10-03 ENCOUNTER — Ambulatory Visit: Payer: Federal, State, Local not specified - PPO | Admitting: Internal Medicine

## 2017-10-03 DIAGNOSIS — G8929 Other chronic pain: Secondary | ICD-10-CM | POA: Diagnosis not present

## 2017-10-03 DIAGNOSIS — M545 Low back pain, unspecified: Secondary | ICD-10-CM

## 2017-10-03 DIAGNOSIS — I10 Essential (primary) hypertension: Secondary | ICD-10-CM | POA: Diagnosis not present

## 2017-10-03 DIAGNOSIS — K047 Periapical abscess without sinus: Secondary | ICD-10-CM | POA: Diagnosis not present

## 2017-10-03 MED ORDER — AMOXICILLIN 500 MG PO CAPS: 500.0000 mg | ORAL_CAPSULE | Freq: Three times a day (TID) | ORAL | 0 refills | Status: DC

## 2017-10-03 MED ORDER — IBUPROFEN 800 MG PO TABS: 800.0000 mg | ORAL_TABLET | Freq: Three times a day (TID) | ORAL | 0 refills | Status: DC | PRN

## 2017-10-03 NOTE — Patient Instructions (Signed)
We have sent in the ibuprofen to take 3 times a day for the next 1-2 weeks.   We have sent in the amoxicillin for the tooth.

## 2017-10-06 DIAGNOSIS — M545 Low back pain, unspecified: Secondary | ICD-10-CM | POA: Insufficient documentation

## 2017-10-06 NOTE — Assessment & Plan Note (Signed)
BP mildly elevated today due to no meds and pain. Previous at goal. Continue lisinopril 10 mg daily.

## 2017-10-06 NOTE — Assessment & Plan Note (Signed)
Sounds to be muscular in nature and not related to rheumatoid arthritis. Rx for ibuprofen 800 mg TID prn for pain and can use flexeril. She is asking for opioids today and this is not first line and advised she would have to try NSAIDs first. She was irritated about this.

## 2017-10-06 NOTE — Progress Notes (Signed)
   Subjective:    Patient ID: Katherine Browning, female    DOB: [birth year], 51 y.o.   MRN: [id number]  HPI The patient is a 51 YO female coming in for several concerns including back pain (tripped on a step which is not in good shape, worsened her low back, this hurts off and on some, took aleve and tylenol which did not help, out of the high dose ibuprofen, tried flexeril which was not helpful, denies pain in the legs or numbness or weakness), and dental problem (broken tooth, took a course of antibiotics about 2 weeks ago, it has gotten another bubble which she popped and pus came out, pain in the area, gets dental coverage in another week), and her blood pressure (taking her lisinopril generally but has not taken in several days, denies headaches or chest pains).   Review of Systems  Constitutional: Negative.   HENT: Positive for dental problem.   Eyes: Negative.   Respiratory: Negative for cough, chest tightness and shortness of breath.   Cardiovascular: Negative for chest pain, palpitations and leg swelling.  Gastrointestinal: Negative for abdominal distention, abdominal pain, constipation, diarrhea, nausea and vomiting.  Musculoskeletal: Positive for back pain and myalgias. Negative for gait problem.  Skin: Negative.   Neurological: Negative.  Negative for tremors, weakness and numbness.  Psychiatric/Behavioral: Negative.       Objective:   Physical Exam  Constitutional: She is oriented to person, place, and time. She appears well-developed and well-nourished.  HENT:  Head: Normocephalic and atraumatic.  Left tooth with redness and swelling of the gum around the broken tooth.  Eyes: EOM are normal.  Neck: Normal range of motion.  Cardiovascular: Normal rate and regular rhythm.  Pulmonary/Chest: Effort normal and breath sounds normal. No respiratory distress. She has no wheezes. She has no rales.  Abdominal: Soft. Bowel sounds are normal. She exhibits no distension. There is no  tenderness. There is no rebound.  Musculoskeletal: She exhibits no edema.  Neurological: She is alert and oriented to person, place, and time. Coordination normal.  Skin: Skin is warm and dry.  Psychiatric: She has a normal mood and affect.   Vitals:   10/03/17 0903  BP: (!) 142/90  Pulse: 81  Temp: 98.2 F (36.8 C)  TempSrc: Oral  SpO2: 100%  Weight: 258 lb (117 kg)  Height: 5' 5.5" (1.664 m)      Assessment & Plan:

## 2017-10-06 NOTE — Assessment & Plan Note (Signed)
Amoxicillin prescription given today and advised that she needs to get a dentist as soon as possible.

## 2017-10-18 ENCOUNTER — Ambulatory Visit (INDEPENDENT_AMBULATORY_CARE_PROVIDER_SITE_OTHER): Payer: Federal, State, Local not specified - PPO | Admitting: Specialist

## 2017-10-18 ENCOUNTER — Ambulatory Visit (INDEPENDENT_AMBULATORY_CARE_PROVIDER_SITE_OTHER): Payer: Federal, State, Local not specified - PPO

## 2017-10-18 ENCOUNTER — Encounter (INDEPENDENT_AMBULATORY_CARE_PROVIDER_SITE_OTHER): Payer: Self-pay | Admitting: Specialist

## 2017-10-18 VITALS — BP 144/92 | HR 76 | Ht 65.5 in | Wt 254.0 lb

## 2017-10-18 DIAGNOSIS — M5440 Lumbago with sciatica, unspecified side: Secondary | ICD-10-CM

## 2017-10-18 DIAGNOSIS — M5136 Other intervertebral disc degeneration, lumbar region: Secondary | ICD-10-CM

## 2017-10-18 MED ORDER — GABAPENTIN 300 MG PO CAPS: ORAL_CAPSULE | ORAL | 0 refills | Status: DC

## 2017-10-18 MED ORDER — DICLOFENAC SODIUM 75 MG PO TBEC: 75.0000 mg | DELAYED_RELEASE_TABLET | Freq: Two times a day (BID) | ORAL | 3 refills | Status: DC

## 2017-10-18 NOTE — Progress Notes (Signed)
Office Visit Note   Patient: Moneka Mcquinn           Date of Birth: December 28, 1965           MRN: [id number] Visit Date: 10/18/2017              Requested by: Myrlene Broker, MD [address] [place], [place] [address] PCP: Myrlene Broker, MD   Assessment & Plan: Visit Diagnoses:  1. Acute bilateral low back pain without sciatica   2. Degenerative disc disease, lumbar     Plan: Avoid frequent bending and stooping  No lifting greater than 10 lbs. May use ice or moist heat for pain. Weight loss is of benefit. Handicap license is approved. Physical therapy at [facility] facility at [facility].   Follow-Up Instructions: No Follow-up on file.   Orders:  Orders Placed This Encounter  Procedures  . XR Lumbar Spine 2-3 Views  . Ambulatory referral to Physical Therapy   Meds ordered this encounter  Medications  . diclofenac (VOLTAREN) 75 MG EC tablet    Sig: Take 1 tablet (75 mg total) by mouth 2 (two) times daily.    Dispense:  60 tablet    Refill:  3  . gabapentin (NEURONTIN) 300 MG capsule    Sig: Take 1 capsule (300 mg total) by mouth at bedtime for 5 days, THEN 1 capsule (300 mg total) 2 (two) times daily for 5 days, THEN 1 capsule (300 mg total) 3 (three) times daily for 20 days.    Dispense:  75 capsule    Refill:  0      Procedures: No procedures performed   Clinical Data: No additional findings.   Subjective: Chief Complaint  Patient presents with  . Lower Back - Pain  . Right Leg - Pain  . Left Leg - Pain    51 year old female with past history of back pain and radiation into the legs, in the past seen here by myself in 2015, MRI with 2 level DDD with assymmetric disc bluges L4-5 and L5-S1. Pain is constant with worsening when she stands from the right buttocks into the right posterior thigh, not usually below the knee. Pain worsening with sitting, bending and stooping increases the pain also  Riding in cars for long periods. No bowel or bladder  difficulty. She can not walk a mile because her back will start to hurt. She is not working, has been retired since 2009 due to knee and ankle bilateral pain. Not sure what is giving her trouble, she was found to have tears in the knees with repetition work, standing for prolong periods for greater than 8 hours per day for10-15 years.  No numbness or pins and needles, feels like cement in her hip, feels like dead weight, aching pain.     Review of Systems  Constitutional: Positive for activity change and unexpected weight change. Negative for appetite change, chills, diaphoresis, fatigue and fever.  HENT: Positive for dental problem. Negative for congestion, drooling, ear discharge, ear pain, facial swelling, hearing loss, mouth sores, nosebleeds, postnasal drip, rhinorrhea, sinus pressure, sinus pain, sneezing, sore throat, tinnitus, trouble swallowing and voice change.   Eyes: Positive for visual disturbance. Negative for photophobia, pain, discharge, redness and itching.  Respiratory: Negative.  Negative for apnea, cough, choking, chest tightness, shortness of breath, wheezing and stridor.   Cardiovascular: Negative.  Negative for chest pain, palpitations and leg swelling.  Gastrointestinal: Negative.  Negative for abdominal distention, abdominal pain,  anal bleeding, blood in stool, constipation, diarrhea, nausea, rectal pain and vomiting.  Endocrine: Negative.  Negative for cold intolerance, heat intolerance, polydipsia, polyphagia and polyuria.  Genitourinary: Negative.  Negative for difficulty urinating, dyspareunia, dysuria, enuresis, flank pain and frequency.  Musculoskeletal: Positive for back pain and gait problem. Negative for arthralgias, joint swelling, myalgias, neck pain and neck stiffness.  Skin: Negative.  Negative for color change, pallor, rash and wound.  Allergic/Immunologic: Negative.  Negative for environmental allergies, food allergies and immunocompromised state.    Neurological: Negative for dizziness, tremors, seizures, syncope, facial asymmetry, speech difficulty, weakness, light-headedness, numbness and headaches.  Hematological: Negative.  Negative for adenopathy. Does not bruise/bleed easily.  Psychiatric/Behavioral: Negative.  Negative for agitation, behavioral problems, confusion, decreased concentration, dysphoric mood, hallucinations, self-injury, sleep disturbance and suicidal ideas. The patient is not nervous/anxious and is not hyperactive.      Objective: Vital Signs: BP (!) 144/92 (BP Location: Left Arm, Patient Position: Sitting)   Pulse 76   Ht 5' 5.5" (1.664 m)   Wt 254 lb (115.2 kg)   BMI 41.62 kg/m   Physical Exam  Constitutional: She is oriented to person, place, and time. She appears well-developed and well-nourished.  HENT:  Head: Normocephalic and atraumatic.  Eyes: EOM are normal. Pupils are equal, round, and reactive to light.  Neck: Normal range of motion. Neck supple.  Pulmonary/Chest: Effort normal and breath sounds normal.  Abdominal: Soft. Bowel sounds are normal.  Musculoskeletal: Normal range of motion.  Neurological: She is alert and oriented to person, place, and time.  Skin: Skin is warm and dry.  Psychiatric: She has a normal mood and affect. Her behavior is normal. Judgment and thought content normal.    Ortho Exam  Specialty Comments:  No specialty comments available.  Imaging: Xr Lumbar Spine 2-3 Views  Result Date: 10/18/2017 AP and lateral flexion and extension radiographs show degenerative disc narrowing L4-5 and L5-S1, No fracture, no dislocation or subluxation. SI joints are normal, Hips with minimal  Right hip squaring of the superolateral head neck but joint lines are well maintained.     PMFS History: Patient Active Problem List   Diagnosis Date Noted  . Low back pain 10/06/2017  . Dental infection 09/22/2016  . Acute rheumatoid arthritis (HCC) 02/11/2016  . Right wrist pain  02/11/2016  . Arthritis 05/01/2015  . Morbid obesity (HCC) 10/27/2014  . Essential hypertension 10/27/2014  . Knee pain, left 10/27/2014  . Routine general medical examination at a health care facility 10/27/2014  . Acne 10/27/2014   Past Medical History:  Diagnosis Date  . Back pain   . Hypertension   . Knee pain     Family History  Problem Relation Age of Onset  . Hypertension Other     Past Surgical History:  Procedure Laterality Date  . ECTOPIC PREGNANCY SURGERY     Social History   Occupational History  . Not on file  Tobacco Use  . Smoking status: Never Smoker  . Smokeless tobacco: Never Used  Substance and Sexual Activity  . Alcohol use: No    Comment: occ  . Drug use: No  . Sexual activity: Not on file

## 2017-10-18 NOTE — Patient Instructions (Addendum)
Plan: Avoid frequent bending and stooping  No lifting greater than 10 lbs. May use ice or moist heat for pain. Weight loss is of benefit. Handicap license is approved Physical therapy at [facility] facility at [facility].

## 2017-11-02 ENCOUNTER — Telehealth: Payer: Self-pay | Admitting: Physical Therapy

## 2017-11-02 NOTE — Telephone Encounter (Signed)
Left message on 10/18/17. No return call. Spoke with family member on 10/24/17 & 11/02/17 asking pt to call re: PT referral. No return call

## 2017-11-08 ENCOUNTER — Emergency Department (HOSPITAL_COMMUNITY)
Admission: EM | Admit: 2017-11-08 | Discharge: 2017-11-08 | Discharge status: Against Medical Advice | Payer: Federal, State, Local not specified - PPO

## 2017-12-12 ENCOUNTER — Telehealth: Payer: Self-pay | Admitting: Internal Medicine

## 2017-12-12 NOTE — Telephone Encounter (Signed)
Called pt no answer LMOM w/MD response../lm,b 

## 2017-12-12 NOTE — Telephone Encounter (Signed)
Would need to get her blood work done that was ordered for her to check kidney function. Until then we cannot prescribe as it can affect kidney function and none in several years.

## 2017-12-12 NOTE — Telephone Encounter (Signed)
Copied from CRM [id number]. Topic: General - Other >> Dec 12, 2017  8:42 AM Percival Spanish wrote:  Pt call to say Dr Okey Dupre has RX her IBUPROFEN 800 MG and is calling to ask if she will RX her this med. Not showing on her med list   Pharamcy  [facility] at [address]

## 2017-12-18 ENCOUNTER — Telehealth (INDEPENDENT_AMBULATORY_CARE_PROVIDER_SITE_OTHER): Payer: Self-pay | Admitting: Specialist

## 2017-12-18 NOTE — Telephone Encounter (Signed)
Patient called advised the medication did not work and she need something else for pain. Patient also need a work note for being out 12/12/17. The number to contact is [phone]

## 2017-12-20 NOTE — Telephone Encounter (Signed)
Patient called advised the medication did not work and she need something else for pain. Patient also need a work note for being out 12/12/17.----Please advise

## 2018-01-01 ENCOUNTER — Other Ambulatory Visit: Payer: Self-pay | Admitting: Internal Medicine

## 2018-02-01 ENCOUNTER — Other Ambulatory Visit: Payer: Self-pay | Admitting: Internal Medicine

## 2018-03-26 ENCOUNTER — Telehealth: Payer: Self-pay | Admitting: Internal Medicine

## 2018-03-26 DIAGNOSIS — G8929 Other chronic pain: Secondary | ICD-10-CM

## 2018-03-26 DIAGNOSIS — M545 Low back pain, unspecified: Secondary | ICD-10-CM

## 2018-03-26 NOTE — Telephone Encounter (Signed)
Copied from CRM [id number]. Topic: Quick Communication - Rx Refill/Question >> Mar 26, 2018  3:55 PM Crist Infante wrote: Medication: ibuprofen (ADVIL,MOTRIN) tablet 800 mg Has the patient contacted their pharmacy? no Pt states the dr has prescribed in the past and requesting sent to [facility] [address] [place], [place] - [address] AT [facility] OF [facility] & [phone] (Phone) [phone] (Fax)

## 2018-03-27 NOTE — Telephone Encounter (Signed)
LOV  10/03/17 DR. Crawford Medication was prescribed on 10/03/17 but it is not on medication list.

## 2018-03-28 ENCOUNTER — Other Ambulatory Visit: Payer: Self-pay | Admitting: Internal Medicine

## 2018-03-28 NOTE — Telephone Encounter (Signed)
It was explained to the patient that Dr. Okey Dupre never prescribed that medication for her and she would need to make an appt to see Dr. Okey Dupre for the issue she is experiencing now that would require the medication, and the patient hung up the phone.

## 2018-03-28 NOTE — Telephone Encounter (Signed)
FYI

## 2018-05-28 ENCOUNTER — Encounter (HOSPITAL_COMMUNITY): Payer: Self-pay | Admitting: Emergency Medicine

## 2018-05-28 ENCOUNTER — Other Ambulatory Visit: Payer: Self-pay

## 2018-05-28 DIAGNOSIS — M5432 Sciatica, left side: Secondary | ICD-10-CM | POA: Insufficient documentation

## 2018-05-28 DIAGNOSIS — S3992XA Unspecified injury of lower back, initial encounter: Secondary | ICD-10-CM | POA: Diagnosis not present

## 2018-05-28 DIAGNOSIS — Y92410 Unspecified street and highway as the place of occurrence of the external cause: Secondary | ICD-10-CM | POA: Insufficient documentation

## 2018-05-28 DIAGNOSIS — I10 Essential (primary) hypertension: Secondary | ICD-10-CM | POA: Diagnosis not present

## 2018-05-28 DIAGNOSIS — Z79899 Other long term (current) drug therapy: Secondary | ICD-10-CM | POA: Insufficient documentation

## 2018-05-28 DIAGNOSIS — M545 Low back pain: Secondary | ICD-10-CM | POA: Diagnosis not present

## 2018-05-28 DIAGNOSIS — Y999 Unspecified external cause status: Secondary | ICD-10-CM | POA: Diagnosis not present

## 2018-05-28 DIAGNOSIS — M5442 Lumbago with sciatica, left side: Secondary | ICD-10-CM | POA: Diagnosis not present

## 2018-05-28 DIAGNOSIS — Y9389 Activity, other specified: Secondary | ICD-10-CM | POA: Insufficient documentation

## 2018-05-28 NOTE — ED Notes (Signed)
Writer asked if it was okay to draw labs that are ordered and patient refused. RN explained why lab work was needed and patient stated "I'm not here for blood work". Patient waiting in lobby at this time.
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
## 2018-05-29 ENCOUNTER — Emergency Department (HOSPITAL_COMMUNITY)
Admission: EM | Admit: 2018-05-29 | Discharge: 2018-05-29 | Disposition: A | Discharge status: Home / Self Care | Payer: Federal, State, Local not specified - PPO | Attending: Emergency Medicine | Admitting: Emergency Medicine

## 2018-05-29 ENCOUNTER — Emergency Department (HOSPITAL_COMMUNITY): Payer: Federal, State, Local not specified - PPO

## 2018-05-29 DIAGNOSIS — M545 Low back pain: Secondary | ICD-10-CM | POA: Diagnosis not present

## 2018-05-29 DIAGNOSIS — M5432 Sciatica, left side: Secondary | ICD-10-CM

## 2018-05-29 DIAGNOSIS — S3992XA Unspecified injury of lower back, initial encounter: Secondary | ICD-10-CM | POA: Diagnosis not present

## 2018-05-29 LAB — POC URINE PREG, ED: PREG TEST UR: NEGATIVE

## 2018-05-29 MED ORDER — DICLOFENAC SODIUM 75 MG PO TBEC: 75.0000 mg | DELAYED_RELEASE_TABLET | Freq: Two times a day (BID) | ORAL | 0 refills | Status: DC

## 2018-05-29 MED ORDER — IBUPROFEN 800 MG PO TABS
800.0000 mg | ORAL_TABLET | Freq: Once | ORAL | Status: AC
  Administered 2018-05-29: 800 mg via ORAL
  Filled 2018-05-29: qty 1

## 2018-05-29 MED ORDER — PREDNISONE 10 MG (21) PO TBPK: ORAL_TABLET | Freq: Every day | ORAL | 0 refills | Status: DC

## 2018-05-29 MED ORDER — METHOCARBAMOL 500 MG PO TABS: 500.0000 mg | ORAL_TABLET | Freq: Two times a day (BID) | ORAL | 0 refills | Status: DC

## 2018-05-29 NOTE — Discharge Instructions (Addendum)
Thank you for allowing me to care for you today in the Emergency Department.   Your symptoms are consistent with sciatica.  Take 6 tabs of prednisone by mouth daily  for 2 days, then 5 tabs for 2 days, then 4 tabs for 2 days, then 3 tabs for 2 days, 2 tabs for 2 days, then 1 tab by mouth daily for 2 days.  This medication is a steroid and can help to decrease inflammation associated with your back pain.  You may also take 1 tablet of Robaxin up to 2 times daily.  This medication is a muscle relaxer and can help with muscle pain and spasms.  You may also take 1 tablet of Voltaren by mouth 2 times daily to help with pain or take 650 mg of Tylenol once every 6 hours to help with pain.  Please avoid taking ibuprofen, Advil, naproxen, or other NSAIDs while taking these medications.  Apply ice or heat for 15 to 20 minutes up to 3-4 times a day.  Start to stretch as your pain allows.  You can call and schedule follow-up appointment with your primary care provider or with [facility] if your symptoms do not improve with this regimen in the next week.  Return to the emergency department if you have episodes where you pee or poop on yourself, have numbness or weakness in one leg, high fever, or other new, concerning symptoms.
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
## 2018-07-20 ENCOUNTER — Telehealth (INDEPENDENT_AMBULATORY_CARE_PROVIDER_SITE_OTHER): Payer: Self-pay | Admitting: Specialist

## 2018-07-20 NOTE — Telephone Encounter (Signed)
Patient has moved back to [place] from [place]. She would like to see where she was referred to for physical therapy and have another order put in. ----looks like we sent to [facility] [employer],  Will she need to bee sen first?  ---Last OV was on 10/18/2018

## 2018-07-20 NOTE — Telephone Encounter (Signed)
Patient has moved back to [place] from [place]. She would like to see where she was referred to for physical therapy and have another order put in.  Patient would like a call # [phone]

## 2018-08-13 DIAGNOSIS — I10 Essential (primary) hypertension: Secondary | ICD-10-CM | POA: Diagnosis not present

## 2018-08-13 DIAGNOSIS — M545 Low back pain: Secondary | ICD-10-CM | POA: Diagnosis not present

## 2018-08-22 NOTE — Telephone Encounter (Signed)
If more than 3 months since last seen she may want her primary care to order or have a return appointment to evaluate if this is necessary.

## 2018-08-23 NOTE — Telephone Encounter (Signed)
I called and advised patient of message below from Dr. Otelia Sergeant

## 2018-09-21 ENCOUNTER — Other Ambulatory Visit: Payer: Self-pay | Admitting: Internal Medicine

## 2018-09-24 ENCOUNTER — Other Ambulatory Visit: Payer: Self-pay | Admitting: Internal Medicine

## 2018-09-25 ENCOUNTER — Ambulatory Visit: Payer: Federal, State, Local not specified - PPO | Attending: Nurse Practitioner | Admitting: Physical Therapy

## 2018-11-05 ENCOUNTER — Other Ambulatory Visit: Payer: Self-pay | Admitting: Internal Medicine

## 2018-11-15 ENCOUNTER — Other Ambulatory Visit: Payer: Self-pay | Admitting: Internal Medicine

## 2018-11-15 ENCOUNTER — Telehealth: Payer: Self-pay | Admitting: Internal Medicine

## 2018-11-15 NOTE — Telephone Encounter (Signed)
Requested medication (s) are due for refill today: Yes  Requested medication (s) are on the active medication list: Yes  Last refill:  10/04/18  Future visit scheduled: No  Notes to clinic:  See notes     Requested Prescriptions  Pending Prescriptions Disp Refills   cloNIDine (CATAPRES) 0.1 MG tablet 30 tablet 0    Sig: Take 1 tablet (0.1 mg total) by mouth every morning. Annual appt is due must see provider for future refills     Cardiovascular:  Alpha-2 Agonists Failed - 11/15/2018  2:24 PM      Failed - Last BP in normal range    BP Readings from Last 1 Encounters:  05/29/18 (!) 156/92         Failed - Valid encounter within last 6 months    Recent Outpatient Visits          1 year ago Dental infection   [facility] -Willis Modena, MD   1 year ago Dental infection   [facility] -Celine Mans, Marye Round, MD   1 year ago Essential hypertension   [facility] -Elam Nche, Bonna Gains, NP   2 years ago Dental infection   [facility] -Willis Modena, MD   2 years ago Essential hypertension    [facility]Willis Modena[facility]             Passed - Last Heart Rate in normal range    Pulse Readings from Last 1 Encounters:  05/29/18 70

## 2018-11-15 NOTE — Telephone Encounter (Signed)
Copied from CRM [id number]. Topic: General - Other >> Nov 15, 2018  2:10 PM Leafy Ro wrote: Reason for CRM:pt is calling and needs a refill on clonidine 0.1 mg. Pt stated she came in and saw someone else. Last time pt saw dr Okey Dupre was nov 2018 according to our records.  Pt said she is not coming in for a med refill nor does she  have the copay. [facility] [address]

## 2018-11-16 NOTE — Telephone Encounter (Signed)
Left patient vm on mobil to call to schedule appt.  Home phone was incorrect.  I have taken that number out.

## 2018-11-16 NOTE — Telephone Encounter (Signed)
noted 

## 2018-12-05 NOTE — Progress Notes (Deleted)
Subjective:    Patient ID: Katherine Browning, female    DOB: 1966-11-05, 53 y.o.   MRN: [id number]  HPI The patient is here for an acute visit.   Knot under left foot:    Hypertension: She is taking her medication daily. She is compliant with a low sodium diet.  She denies chest pain, palpitations, edema, shortness of breath and regular headaches. She is exercising regularly.  She does not monitor her blood pressure at home.     Medications and allergies reviewed with patient and updated if appropriate.  Patient Active Problem List   Diagnosis Date Noted  . Low back pain 10/06/2017  . Dental infection 09/22/2016  . Acute rheumatoid arthritis (HCC) 02/11/2016  . Right wrist pain 02/11/2016  . Arthritis 05/01/2015  . Morbid obesity (HCC) 10/27/2014  . Essential hypertension 10/27/2014  . Knee pain, left 10/27/2014  . Routine general medical examination at a health care facility 10/27/2014  . Acne 10/27/2014    Current Outpatient Medications on File Prior to Visit  Medication Sig Dispense Refill  . cloNIDine (CATAPRES) 0.1 MG tablet TAKE 1 TABLET BY MOUTH EVERY MORNING 90 tablet 0  . cloNIDine (CATAPRES) 0.1 MG tablet Take 1 tablet (0.1 mg total) by mouth every morning. Annual appt is due must see provider for future refills 30 tablet 0  . diclofenac (VOLTAREN) 75 MG EC tablet Take 1 tablet (75 mg total) by mouth 2 (two) times daily. 60 tablet 0  . ibuprofen (ADVIL,MOTRIN) 800 MG tablet Take 800 mg by mouth every 8 (eight) hours as needed for moderate pain.    Marland Kitchen lisinopril (ZESTRIL) 10 MG tablet Take 1 tablet (10 mg total) by mouth daily. 30 tablet 0  . methocarbamol (ROBAXIN) 500 MG tablet Take 1 tablet (500 mg total) by mouth 2 (two) times daily. 20 tablet 0  . predniSONE (STERAPRED UNI-PAK 21 TAB) 10 MG (21) TBPK tablet Take by mouth daily. Take 6 tabs by mouth daily  for 2 days, then 5 tabs for 2 days, then 4 tabs for 2 days, then 3 tabs for 2 days, 2 tabs for 2 days, then 1  tab by mouth daily for 2 days 42 tablet 0   No current facility-administered medications on file prior to visit.     Past Medical History:  Diagnosis Date  . Back pain   . Hypertension   . Knee pain     Past Surgical History:  Procedure Laterality Date  . ECTOPIC PREGNANCY SURGERY      Social History   Socioeconomic History  . Marital status: Widowed    Spouse name: Not on file  . Number of children: Not on file  . Years of education: Not on file  . Highest education level: Not on file  Occupational History  . Not on file  Social Needs  . Financial resource strain: Not on file  . Food insecurity:    Worry: Not on file    Inability: Not on file  . Transportation needs:    Medical: Not on file    Non-medical: Not on file  Tobacco Use  . Smoking status: Never Smoker  . Smokeless tobacco: Never Used  Substance and Sexual Activity  . Alcohol use: No    Comment: occ  . Drug use: No  . Sexual activity: Not on file  Lifestyle  . Physical activity:    Days per week: Not on file    Minutes per session: Not on file  .  Stress: Not on file  Relationships  . Social connections:    Talks on phone: Not on file    Gets together: Not on file    Attends religious service: Not on file    Active member of club or organization: Not on file    Attends meetings of clubs or organizations: Not on file    Relationship status: Not on file  Other Topics Concern  . Not on file  Social History Narrative  . Not on file    Family History  Problem Relation Age of Onset  . Hypertension Other     Review of Systems     Objective:  There were no vitals filed for this visit. BP Readings from Last 3 Encounters:  05/29/18 (!) 156/92  10/18/17 (!) 144/92  10/03/17 (!) 142/90   Wt Readings from Last 3 Encounters:  05/29/18 265 lb (120.2 kg)  10/18/17 254 lb (115.2 kg)  10/03/17 258 lb (117 kg)   There is no height or weight on file to calculate BMI.   Physical Exam          Assessment & Plan:    See Problem List for Assessment and Plan of chronic medical problems.

## 2018-12-06 ENCOUNTER — Ambulatory Visit: Payer: Federal, State, Local not specified - PPO | Admitting: Internal Medicine

## 2018-12-06 NOTE — Telephone Encounter (Signed)
Patient must schedule appointment for med refill with her PCP

## 2019-01-15 ENCOUNTER — Other Ambulatory Visit: Payer: Self-pay

## 2019-04-15 ENCOUNTER — Other Ambulatory Visit: Payer: Self-pay | Admitting: Internal Medicine

## 2019-04-15 DIAGNOSIS — I10 Essential (primary) hypertension: Secondary | ICD-10-CM

## 2019-07-23 ENCOUNTER — Ambulatory Visit: Payer: Federal, State, Local not specified - PPO | Admitting: Internal Medicine

## 2020-02-17 ENCOUNTER — Telehealth: Payer: Self-pay

## 2020-02-17 NOTE — Telephone Encounter (Signed)
Patient walked into office and states that she needed an appointment for med refills. States that she is completely out of medication. Advised that she had not been seen since 09/2017. Patient denies that, States that she has been seen since then, just not during the pandemic.  Scheduled patient for CPE on 03/05/2020 at 3:20pm. Would like to know if all medications could be sent to pharmacy to last until appointment? Please advise.   [id number] - [place], [place] - [address] AT [facility] [address]

## 2020-02-18 NOTE — Telephone Encounter (Signed)
I would recommend sooner visit with any provider this week for med refill only. Can keep visit for 4/15 as well.

## 2020-02-18 NOTE — Telephone Encounter (Signed)
Can pt be scheduled with another provider to receive med refill? Pt to keep the 04/15 appt with PCP.

## 2020-02-19 NOTE — Telephone Encounter (Signed)
LVM for patient to call and see if she wanted to be seen sooner for the med refill.

## 2020-03-05 ENCOUNTER — Encounter: Payer: Federal, State, Local not specified - PPO | Admitting: Internal Medicine

## 2020-03-27 ENCOUNTER — Ambulatory Visit: Payer: Federal, State, Local not specified - PPO | Admitting: Family

## 2020-03-27 ENCOUNTER — Other Ambulatory Visit: Payer: Self-pay | Admitting: Family

## 2020-03-27 ENCOUNTER — Encounter: Payer: Self-pay | Admitting: Family

## 2020-03-27 ENCOUNTER — Other Ambulatory Visit: Payer: Self-pay

## 2020-03-27 DIAGNOSIS — I10 Essential (primary) hypertension: Secondary | ICD-10-CM

## 2020-03-27 LAB — COMPREHENSIVE METABOLIC PANEL
ALT: 9 U/L (ref 0–35)
AST: 13 U/L (ref 0–37)
Albumin: 4.4 g/dL (ref 3.5–5.2)
Alkaline Phosphatase: 77 U/L (ref 39–117)
BUN: 12 mg/dL (ref 6–23)
CO2: 29 mEq/L (ref 19–32)
Calcium: 9.8 mg/dL (ref 8.4–10.5)
Chloride: 105 mEq/L (ref 96–112)
Creatinine, Ser: 1.01 mg/dL (ref 0.40–1.20)
GFR: 69.58 mL/min (ref >60.00)
Glucose, Bld: 101 mg/dL — ABNORMAL HIGH (ref 70–99)
Potassium: 4.2 mEq/L (ref 3.5–5.1)
Sodium: 140 mEq/L (ref 135–145)
Total Bilirubin: 0.4 mg/dL (ref 0.2–1.2)
Total Protein: 8.4 g/dL — ABNORMAL HIGH (ref 6.0–8.3)

## 2020-03-27 LAB — CBC WITH DIFFERENTIAL/PLATELET
Basophils Absolute: 0 10*3/uL (ref 0.0–0.1)
Basophils Relative: 0.5 % (ref 0.0–3.0)
Eosinophils Absolute: 0.1 10*3/uL (ref 0.0–0.7)
Eosinophils Relative: 1 % (ref 0.0–5.0)
HCT: 40.2 % (ref 36.0–46.0)
Hemoglobin: 13.3 g/dL (ref 12.0–15.0)
Lymphocytes Relative: 35 % (ref 12.0–46.0)
Lymphs Abs: 2.1 10*3/uL (ref 0.7–4.0)
MCHC: 33.1 g/dL (ref 30.0–36.0)
MCV: 88.2 fl (ref 78.0–100.0)
Monocytes Absolute: 0.4 10*3/uL (ref 0.1–1.0)
Monocytes Relative: 6.1 % (ref 3.0–12.0)
Neutro Abs: 3.5 10*3/uL (ref 1.4–7.7)
Neutrophils Relative %: 57.4 % (ref 43.0–77.0)
Platelets: 208 10*3/uL (ref 150.0–400.0)
RBC: 4.56 Mil/uL (ref 3.87–5.11)
RDW: 15.9 % — ABNORMAL HIGH (ref 11.5–15.5)
WBC: 6.1 10*3/uL (ref 4.0–10.5)

## 2020-03-27 MED ORDER — CLONIDINE HCL 0.1 MG PO TABS: 0.1000 mg | ORAL_TABLET | Freq: Every morning | ORAL | 0 refills | Status: DC

## 2020-03-27 MED ORDER — LISINOPRIL 10 MG PO TABS: 10.0000 mg | ORAL_TABLET | Freq: Every day | ORAL | 0 refills | Status: DC

## 2020-03-27 NOTE — Progress Notes (Signed)
Katherine Browning is a 54 y.o. female with the following history as recorded in Epic[employer]:  Patient Active Problem List   Diagnosis Date Noted  . Low back pain 10/06/2017  . Dental infection 09/22/2016  . Acute rheumatoid arthritis ([place]) 02/11/2016  . Right wrist pain 02/11/2016  . Arthritis 05/01/2015  . Morbid obesity ([place]) 10/27/2014  . Essential hypertension 10/27/2014  . Knee pain, left 10/27/2014  . Routine general medical examination at a health care facility 10/27/2014  . Acne 10/27/2014    Current Outpatient Medications  Medication Sig Dispense Refill  . cloNIDine (CATAPRES) 0.1 MG tablet Take 1 tablet (0.1 mg total) by mouth every morning. Annual appt is due must see provider for future refills 30 tablet 0  . cyclobenzaprine (FLEXERIL) 10 MG tablet Take 10 mg by mouth 2 (two) times daily.    Marland Kitchen ibuprofen (ADVIL,MOTRIN) 800 MG tablet Take 800 mg by mouth every 8 (eight) hours as needed for moderate pain.    Marland Kitchen lisinopril (ZESTRIL) 10 MG tablet Take 1 tablet (10 mg total) by mouth daily. 30 tablet 0   No current facility-administered medications for this visit.    Allergies: Ivp dye [iodinated diagnostic agents], Biaxin [clarithromycin], Iodine, and Vicodin [hydrocodone-acetaminophen]  Past Medical History:  Diagnosis Date  . Back pain   . Hypertension   . Knee pain     Past Surgical History:  Procedure Laterality Date  . ECTOPIC PREGNANCY SURGERY      Family History  Problem Relation Age of Onset  . Hypertension Other     Social History   Tobacco Use  . Smoking status: Never Smoker  . Smokeless tobacco: Never Used  Substance Use Topics  . Alcohol use: No    Comment: occ    Subjective:  Patient has not been seen in our office since November 2018; she has no-showed multiple follow-up visits. She is requesting a refill on her blood pressure medications. She is saying that she takes 20 mg of Lisinopril twice a day even though records here have always shown 10 mg of  Lisinopril daily. She notes she has been out of blood pressure medication for the past month but no refills have been given here since November 2019; She had an appointment with her PCP in April 2021 but opted to cancel that appointment. She wants Ibuprofen and Flexeril which have never been written by her provider at this office. She note she has been getting her care in [place] and does not want to discuss anything other than getting her blood pressure medication refills.   Objective:  Vitals:   03/27/20 1500  BP: (!) 146/84  Pulse: 84  Temp: 98.2 F (36.8 C)  TempSrc: Oral  SpO2: 99%  Weight: 293 lb 9.6 oz (133.2 kg)  Height: 5' 5.5" (1.664 m)    General: Well developed, well nourished, in no acute distress  Skin : Warm and dry.  Head: Normocephalic and atraumatic  Lungs: Respirations unlabored; clear to auscultation bilaterally without wheeze, rales, rhonchi  CVS exam: normal rate and regular rhythm.  Abdomen: Soft; nontender; nondistended; normoactive bowel sounds; no masses or hepatosplenomegaly  Musculoskeletal: No deformities; no active joint inflammation  Extremities: No edema, cyanosis, clubbing  Vessels: Symmetric bilaterally  Neurologic: Alert and oriented; speech intact; face symmetrical; moves all extremities well; CNII-XII intact without focal deficit   Assessment:  1. Essential hypertension     Plan:  Explained to patient that she has only ever gotten 10 mg of Lisinopril from  our office and we will not be able to change her dosage to 40 mg; she is given 30 day refills on her Clonidine and Lisinopril; she agrees to have her CBC, CMP updated- last record here was done in 2017; She notes she has been getting her care managed by a provider in [place] and only want to get her refills updated for her blood pressure medication. She is asked to follow-up with her PCP in 1 month for follow-up/ continue care.   This visit occurred during the SARS-CoV-2 public health  emergency.  Safety protocols were in place, including screening questions prior to the visit, additional usage of staff PPE, and extensive cleaning of exam room while observing appropriate contact time as indicated for disinfecting solutions.    No follow-ups on file.  Orders Placed This Encounter  Procedures  . CBC with Differential/Platelet  . Comp Met (CMET)    Requested Prescriptions   Signed Prescriptions Disp Refills  . cloNIDine (CATAPRES) 0.1 MG tablet 30 tablet 0    Sig: Take 1 tablet (0.1 mg total) by mouth every morning. Annual appt is due must see provider for future refills  . lisinopril (ZESTRIL) 10 MG tablet 30 tablet 0    Sig: Take 1 tablet (10 mg total) by mouth daily.

## 2020-04-23 ENCOUNTER — Other Ambulatory Visit: Payer: Self-pay | Admitting: Family

## 2020-04-23 ENCOUNTER — Other Ambulatory Visit: Payer: Self-pay | Admitting: Internal Medicine

## 2020-04-23 DIAGNOSIS — I10 Essential (primary) hypertension: Secondary | ICD-10-CM

## 2020-04-30 ENCOUNTER — Ambulatory Visit: Payer: Federal, State, Local not specified - PPO | Admitting: Internal Medicine

## 2020-04-30 ENCOUNTER — Encounter: Payer: Self-pay | Admitting: Internal Medicine

## 2020-04-30 ENCOUNTER — Other Ambulatory Visit: Payer: Self-pay

## 2020-04-30 VITALS — BP 136/88 | HR 82 | Temp 98.2°F | Ht 65.5 in | Wt 300.0 lb

## 2020-04-30 DIAGNOSIS — R635 Abnormal weight gain: Secondary | ICD-10-CM

## 2020-04-30 DIAGNOSIS — M5440 Lumbago with sciatica, unspecified side: Secondary | ICD-10-CM | POA: Diagnosis not present

## 2020-04-30 DIAGNOSIS — I10 Essential (primary) hypertension: Secondary | ICD-10-CM

## 2020-04-30 LAB — COMPREHENSIVE METABOLIC PANEL
ALT: 9 U/L (ref 0–35)
AST: 13 U/L (ref 0–37)
Albumin: 4.1 g/dL (ref 3.5–5.2)
Alkaline Phosphatase: 76 U/L (ref 39–117)
BUN: 14 mg/dL (ref 6–23)
CO2: 27 mEq/L (ref 19–32)
Calcium: 9.5 mg/dL (ref 8.4–10.5)
Chloride: 107 mEq/L (ref 96–112)
Creatinine, Ser: 0.97 mg/dL (ref 0.40–1.20)
GFR: 72.88 mL/min (ref >60.00)
Glucose, Bld: 88 mg/dL (ref 70–99)
Potassium: 4.4 mEq/L (ref 3.5–5.1)
Sodium: 140 mEq/L (ref 135–145)
Total Bilirubin: 0.5 mg/dL (ref 0.2–1.2)
Total Protein: 7.7 g/dL (ref 6.0–8.3)

## 2020-04-30 LAB — LIPID PANEL
Cholesterol: 175 mg/dL (ref 0–200)
HDL: 59.3 mg/dL (ref >39.00)
LDL Cholesterol: 108 mg/dL — ABNORMAL HIGH (ref 0–99)
NonHDL: 115.59
Total CHOL/HDL Ratio: 3
Triglycerides: 39 mg/dL (ref 0.0–149.0)
VLDL: 7.8 mg/dL (ref 0.0–40.0)

## 2020-04-30 LAB — CBC
HCT: 37 % (ref 36.0–46.0)
Hemoglobin: 12.2 g/dL (ref 12.0–15.0)
MCHC: 33.1 g/dL (ref 30.0–36.0)
MCV: 88.8 fl (ref 78.0–100.0)
Platelets: 185 10*3/uL (ref 150.0–400.0)
RBC: 4.17 Mil/uL (ref 3.87–5.11)
RDW: 15.9 % — ABNORMAL HIGH (ref 11.5–15.5)
WBC: 4.1 10*3/uL (ref 4.0–10.5)

## 2020-04-30 LAB — VITAMIN B12: Vitamin B-12: 491 pg/mL (ref 211–911)

## 2020-04-30 LAB — TSH: TSH: 1.39 u[IU]/mL (ref 0.35–4.50)

## 2020-04-30 LAB — VITAMIN D 25 HYDROXY (VIT D DEFICIENCY, FRACTURES): VITD: 38.46 ng/mL (ref 30.00–100.00)

## 2020-04-30 MED ORDER — CYCLOBENZAPRINE HCL 10 MG PO TABS: 10.0000 mg | ORAL_TABLET | Freq: Two times a day (BID) | ORAL | 3 refills | Status: DC

## 2020-04-30 MED ORDER — IBUPROFEN 800 MG PO TABS: 800.0000 mg | ORAL_TABLET | Freq: Three times a day (TID) | ORAL | 3 refills | Status: DC | PRN

## 2020-04-30 MED ORDER — CLONIDINE HCL 0.1 MG PO TABS: 0.1000 mg | ORAL_TABLET | Freq: Every morning | ORAL | 1 refills | Status: DC

## 2020-04-30 MED ORDER — LISINOPRIL 10 MG PO TABS: ORAL_TABLET | ORAL | 3 refills | Status: DC

## 2020-04-30 NOTE — Patient Instructions (Signed)
We will check the labs today to make sure the weight is not being caused by something going on inside.

## 2020-04-30 NOTE — Assessment & Plan Note (Signed)
Weight is up significantly and checking lipid panel and HgA1c to rule out additional complications. Complicated by chronic joint pain and hypertension.

## 2020-04-30 NOTE — Assessment & Plan Note (Signed)
Checking labs to rule out metabolic causes of weight gain. Suspect diet changes and lack of physical activity as well as peri-menopausal state may also be contributing in a significant way.

## 2020-04-30 NOTE — Assessment & Plan Note (Signed)
BP at goal on clonidine 0.1 mg daily and lisinopril 10 mg daily. Recent BMP okay but this was off medication. Needs repeat CMP given that she has resumed ACE-I.

## 2020-04-30 NOTE — Progress Notes (Signed)
   Subjective:   Patient ID: Katherine Browning, female    DOB: 1966/10/13, 54 y.o.   MRN: [id number]  HPI The patient is a 54 YO female coming in for follow up blood pressure (taking lisinopril and clonidine currently, recent resumption of meds about 1 month ago, denies chest pains or headaches) and morbid obesity/weight gain (has not been to gym or doing any physical activity in 16 months due to pandemic, has added back bread to diet recently, is working at home, feels concerned as she was down to 227 at one time) and low back pain (uses flexeril and ibuprofen rarely, denies significant pain now, is having more flare ups recently due to significant weight gain).   Review of Systems  Constitutional: Positive for activity change and unexpected weight change.  HENT: Negative.   Eyes: Negative.   Respiratory: Negative for cough, chest tightness and shortness of breath.   Cardiovascular: Negative for chest pain, palpitations and leg swelling.  Gastrointestinal: Negative for abdominal distention, abdominal pain, constipation, diarrhea, nausea and vomiting.  Musculoskeletal: Positive for back pain and myalgias.  Skin: Negative.   Neurological: Negative.   Psychiatric/Behavioral: Negative.     Objective:  Physical Exam Constitutional:      Appearance: She is well-developed. She is obese.  HENT:     Head: Normocephalic and atraumatic.  Cardiovascular:     Rate and Rhythm: Normal rate and regular rhythm.  Pulmonary:     Effort: Pulmonary effort is normal. No respiratory distress.     Breath sounds: Normal breath sounds. No wheezing or rales.  Abdominal:     General: Bowel sounds are normal. There is no distension.     Palpations: Abdomen is soft.     Tenderness: There is no abdominal tenderness. There is no rebound.  Musculoskeletal:     Cervical back: Normal range of motion.  Skin:    General: Skin is warm and dry.  Neurological:     Mental Status: She is alert and oriented to person,  place, and time.     Coordination: Coordination normal.     Vitals:   04/30/20 1103  BP: 136/88  Pulse: 82  Temp: 98.2 F (36.8 C)  TempSrc: Oral  SpO2: 99%  Weight: 300 lb (136.1 kg)  Height: 5' 5.5" (1.664 m)    This visit occurred during the SARS-CoV-2 public health emergency.  Safety protocols were in place, including screening questions prior to the visit, additional usage of staff PPE, and extensive cleaning of exam room while observing appropriate contact time as indicated for disinfecting solutions.   Assessment & Plan:

## 2020-04-30 NOTE — Assessment & Plan Note (Signed)
Refill flexeril to use intermittently as well as ibuprofen.

## 2020-05-01 LAB — HEMOGLOBIN A1C: Hgb A1c MFr Bld: 5.5 % (ref 4.6–6.5)

## 2020-05-21 ENCOUNTER — Other Ambulatory Visit: Payer: Self-pay | Admitting: Internal Medicine

## 2020-05-28 ENCOUNTER — Telehealth: Payer: Self-pay | Admitting: Internal Medicine

## 2020-05-28 NOTE — Telephone Encounter (Signed)
Weight loss medication options are contrave, qsymia (both pills) or saxenda (which is a daily injection). She should check with her insurance company on coverage for these and let us know which she wants to try.

## 2020-05-28 NOTE — Telephone Encounter (Signed)
New message:    Pt is calling and would like for the Dr to send her in a prescription for some weight loss pills. She states she was told she needed to do labs first and those have been completed. Please advise. Pt uses  [id number] - [place], [place] - [address] AT [facility] [address]

## 2020-05-28 NOTE — Telephone Encounter (Signed)
Pt has been informed and will check with insurance.

## 2020-07-23 ENCOUNTER — Telehealth: Payer: Self-pay | Admitting: Internal Medicine

## 2020-07-23 NOTE — Telephone Encounter (Signed)
I received [employer] via fax.  Spoke with patient and she states she has not spoken with Dr.Crawford about this yet. She thought they just wanted medical records. I informed patient it is an Medical Opinion statement from the provider stating if the patient can or can not work. She would need to set up an appointment with Dr.Crawford to have these forms completed.   Patient states she will follow up with the, because she thought they only wanted medical records sent & She doesn't understand why she would need an appointment with this.

## 2020-07-31 NOTE — Telephone Encounter (Signed)
I have not heard back from the patient on this matter. I will hold forms for 30 days.

## 2021-03-05 ENCOUNTER — Other Ambulatory Visit: Payer: Self-pay | Admitting: Internal Medicine

## 2021-05-07 ENCOUNTER — Other Ambulatory Visit: Payer: Self-pay | Admitting: Internal Medicine

## 2021-05-12 ENCOUNTER — Other Ambulatory Visit: Payer: Self-pay | Admitting: Internal Medicine

## 2021-05-21 ENCOUNTER — Telehealth: Payer: Self-pay | Admitting: Internal Medicine

## 2021-05-21 NOTE — Telephone Encounter (Signed)
   Patient is requesting a call back in regards to recommendations for gout flare up.

## 2021-05-21 NOTE — Telephone Encounter (Signed)
Called patient. LVM asking her to return my call here at the office. Office number was provided.  

## 2021-08-05 ENCOUNTER — Other Ambulatory Visit: Payer: Self-pay | Admitting: Internal Medicine

## 2021-09-01 ENCOUNTER — Telehealth: Payer: Self-pay | Admitting: Internal Medicine

## 2021-09-01 NOTE — Telephone Encounter (Signed)
Pt States she is having an allergic reaction to lisinopril (ZESTRIL) 10 MG tablet  Pt states lips are swollen no other symptoms  Call transferred to [facility]

## 2021-09-01 NOTE — Telephone Encounter (Signed)
[facility] called to follow-up regarding Katherine Browning's allergic reaction. The outcome was to go to ED, but the patient refused.  Called the patient to get her scheduled in our office tomorrow for an appointment, she stated  She did not refuse, and hung up the phone

## 2021-09-01 NOTE — Telephone Encounter (Signed)
Called patient and it went to voicemail. LVM asking her to give our office a call back to discuss.

## 2021-09-02 NOTE — Telephone Encounter (Signed)
[facility] FYI...   Caller is calling from the office to triage a patient who is having a possible reaction to Lisinopril 10mg , she has lip swelling this morning and under eye puffiness. Has been on it in the past but never took it all the time. Started this morning.   Advised to go to ED now. Patient disagreed and refused.   Caller was very angry form the beginning of the covnersation and when I told her my recommendation is to go to the ER she became more angry and said she was not going to an er and spending millions of dollars she just wants an appointment. Advised my recommendation is to go to the ER and if your tongue or throat begins to swell to call 911. As your throat can swell shut She disconnected the call. Caller states she has taken one benadryl. Advised the adult dose is 2 capsules every 4-6 hours as needed.

## 2021-11-08 ENCOUNTER — Ambulatory Visit: Payer: Federal, State, Local not specified - PPO | Admitting: Internal Medicine

## 2021-11-08 ENCOUNTER — Ambulatory Visit (INDEPENDENT_AMBULATORY_CARE_PROVIDER_SITE_OTHER): Payer: Federal, State, Local not specified - PPO

## 2021-11-08 ENCOUNTER — Encounter: Payer: Self-pay | Admitting: Internal Medicine

## 2021-11-08 ENCOUNTER — Other Ambulatory Visit: Payer: Self-pay

## 2021-11-08 VITALS — BP 130/70 | HR 78 | Resp 18 | Ht 65.5 in | Wt 339.2 lb

## 2021-11-08 DIAGNOSIS — M5440 Lumbago with sciatica, unspecified side: Secondary | ICD-10-CM | POA: Diagnosis not present

## 2021-11-08 DIAGNOSIS — G8929 Other chronic pain: Secondary | ICD-10-CM

## 2021-11-08 DIAGNOSIS — M545 Low back pain, unspecified: Secondary | ICD-10-CM | POA: Diagnosis not present

## 2021-11-08 DIAGNOSIS — M25562 Pain in left knee: Secondary | ICD-10-CM

## 2021-11-08 DIAGNOSIS — R635 Abnormal weight gain: Secondary | ICD-10-CM

## 2021-11-08 DIAGNOSIS — I10 Essential (primary) hypertension: Secondary | ICD-10-CM

## 2021-11-08 DIAGNOSIS — F4323 Adjustment disorder with mixed anxiety and depressed mood: Secondary | ICD-10-CM

## 2021-11-08 DIAGNOSIS — F4310 Post-traumatic stress disorder, unspecified: Secondary | ICD-10-CM | POA: Diagnosis not present

## 2021-11-08 LAB — COMPREHENSIVE METABOLIC PANEL
ALT: 16 U/L (ref 0–35)
AST: 14 U/L (ref 0–37)
Albumin: 4.2 g/dL (ref 3.5–5.2)
Alkaline Phosphatase: 70 U/L (ref 39–117)
BUN: 14 mg/dL (ref 6–23)
CO2: 29 mEq/L (ref 19–32)
Calcium: 10 mg/dL (ref 8.4–10.5)
Chloride: 104 mEq/L (ref 96–112)
Creatinine, Ser: 1.03 mg/dL (ref 0.40–1.20)
GFR: 61.93 mL/min (ref >60.00)
Glucose, Bld: 95 mg/dL (ref 70–99)
Potassium: 4.6 mEq/L (ref 3.5–5.1)
Sodium: 141 mEq/L (ref 135–145)
Total Bilirubin: 0.4 mg/dL (ref 0.2–1.2)
Total Protein: 8 g/dL (ref 6.0–8.3)

## 2021-11-08 LAB — CBC
HCT: 39.8 % (ref 36.0–46.0)
Hemoglobin: 12.9 g/dL (ref 12.0–15.0)
MCHC: 32.4 g/dL (ref 30.0–36.0)
MCV: 90.4 fl (ref 78.0–100.0)
Platelets: 209 10*3/uL (ref 150.0–400.0)
RBC: 4.41 Mil/uL (ref 3.87–5.11)
RDW: 15.1 % (ref 11.5–15.5)
WBC: 5 10*3/uL (ref 4.0–10.5)

## 2021-11-08 LAB — LIPID PANEL
Cholesterol: 182 mg/dL (ref 0–200)
HDL: 59.4 mg/dL (ref >39.00)
LDL Cholesterol: 113 mg/dL — ABNORMAL HIGH (ref 0–99)
NonHDL: 122.26
Total CHOL/HDL Ratio: 3
Triglycerides: 47 mg/dL (ref 0.0–149.0)
VLDL: 9.4 mg/dL (ref 0.0–40.0)

## 2021-11-08 LAB — HEMOGLOBIN A1C: Hgb A1c MFr Bld: 6 % (ref 4.6–6.5)

## 2021-11-08 MED ORDER — PANTOPRAZOLE SODIUM 40 MG PO TBEC
40.0000 mg | DELAYED_RELEASE_TABLET | Freq: Every day | ORAL | 3 refills | Status: DC
Start: 2021-11-08 — End: 2023-10-03

## 2021-11-08 MED ORDER — CLONIDINE HCL 0.1 MG PO TABS: 0.1000 mg | ORAL_TABLET | Freq: Every day | ORAL | 1 refills | Status: DC

## 2021-11-08 MED ORDER — CYCLOBENZAPRINE HCL 10 MG PO TABS: 10.0000 mg | ORAL_TABLET | Freq: Two times a day (BID) | ORAL | 3 refills | Status: DC

## 2021-11-08 MED ORDER — IBUPROFEN 800 MG PO TABS: 800.0000 mg | ORAL_TABLET | Freq: Three times a day (TID) | ORAL | 1 refills | Status: DC | PRN

## 2021-11-08 NOTE — Progress Notes (Signed)
° °  Subjective:   Patient ID: Katherine Browning, female    DOB: 1966-05-19, 55 y.o.   MRN: [id number]  HPI The patient is a 55 YO female coming in for several concerns including mood, weight gain, back pain, knee pain. Last period about 2 years ago or so.   Review of Systems  Constitutional:  Positive for activity change and unexpected weight change. Negative for appetite change and fatigue.  HENT: Negative.    Eyes: Negative.   Respiratory:  Negative for cough, chest tightness and shortness of breath.   Cardiovascular:  Negative for chest pain, palpitations and leg swelling.  Gastrointestinal:  Negative for abdominal distention, abdominal pain, constipation, diarrhea, nausea and vomiting.  Musculoskeletal:  Positive for arthralgias, back pain and myalgias.  Skin: Negative.   Neurological: Negative.   Psychiatric/Behavioral:  Positive for decreased concentration and dysphoric mood.    Objective:  Physical Exam Constitutional:      Appearance: She is well-developed. She is obese.  HENT:     Head: Normocephalic and atraumatic.  Cardiovascular:     Rate and Rhythm: Normal rate and regular rhythm.  Pulmonary:     Effort: Pulmonary effort is normal. No respiratory distress.     Breath sounds: Normal breath sounds. No wheezing or rales.  Abdominal:     General: Bowel sounds are normal. There is no distension.     Palpations: Abdomen is soft.     Tenderness: There is no abdominal tenderness. There is no rebound.  Musculoskeletal:        General: Tenderness present.     Cervical back: Normal range of motion.  Skin:    General: Skin is warm and dry.  Neurological:     Mental Status: She is alert and oriented to person, place, and time.     Coordination: Coordination normal.    Vitals:   11/08/21 0815  BP: 130/70  Pulse: 78  Resp: 18  SpO2: 99%  Weight: (!) 339 lb 3.2 oz (153.9 kg)  Height: 5' 5.5" (1.664 m)    This visit occurred during the SARS-CoV-2 public health emergency.   Safety protocols were in place, including screening questions prior to the visit, additional usage of staff PPE, and extensive cleaning of exam room while observing appropriate contact time as indicated for disinfecting solutions.   Assessment & Plan:  Visit time 45 minutes in face to face communication with patient and coordination of care, additional 5 minutes spent in record review, coordination or care, ordering tests, communicating/referring to other healthcare professionals, documenting in medical records all on the same day of the visit for total time 50 minutes spent on the visit.

## 2021-11-08 NOTE — Patient Instructions (Addendum)
We will get you in with the healthy weight and wellness clinic to help with the weight.  We will also get you in with counseling to help.  The medications for weight you can take include qsymia and contrave.  We have sent in protonix for the stomach which typically we advise to take daily for 2-3 weeks and then try to stop if able.  We have sent in ibuprofen and flexeril for the back and are checking the x-ray.  Do not take lisinopril again since you had the reaction.  Given the change in the bowels think about having the colon cancer screening.

## 2021-11-09 DIAGNOSIS — F4320 Adjustment disorder, unspecified: Secondary | ICD-10-CM | POA: Insufficient documentation

## 2021-11-09 NOTE — Assessment & Plan Note (Signed)
She is having chronic low back pain since our last visit. Using ibuprofen intermittently and flexeril daily for the pain with some relief. Refilled both. She is requesting percocet for pain which I counseled is not first line for chronic low back pain. Ordered x-ray today to check for any change. 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 Weight gain has likely contributed to more consistent chronic pain which we are also trying to address.

## 2021-11-09 NOTE — Assessment & Plan Note (Signed)
She states taking clonidine 0.1 mg daily although we have not filled and she should have been out. She states had bottles left over and has been taking. Counseled that this can cause reflex hypertension with abruptly stopping and we do not recommend to stop. She had lip swelling on lisinopril back in October. Called in and was advised to go to ER. She did not do this but did stop taking lisinopril. Advised she should not ever resume lisinopril or similar medications as angioedema is a known side effect. Checking CMP as none in some time.

## 2021-11-09 NOTE — Assessment & Plan Note (Signed)
Knee pain did resolve with weight reduction and has been hurting again with weight gain. Refilled ibuprofen today. This is frustrating for her as weight gain leads to her moving less which makes it difficult to lose weight.

## 2021-11-09 NOTE — Assessment & Plan Note (Addendum)
She is not sure how this is impacting her. In discussion it sounds as if she is having daily symptoms but then she denies that this is daily. She is having some mood swings and we did discuss that menopause could be related. She was asking for lorazepam as she has taken this in the past. Then mentioned PTSD which she has had in the past. Lorazepam is not appropriate for PTSD. She has agreed to go to counseling so they can help clarify what may be appropriate for her. Offered cymbalta as she is having concurrent joint pains but she does not wish to take this. A relative of hers had some side effect with it. Offered lexapro instead and she did not want to try this.

## 2021-11-09 NOTE — Assessment & Plan Note (Signed)
Checking lipid panel and HgA1c for screening for complications. Weight is increased significantly since last visit. Also weight is locating around stomach area and we discussed that menopause could be causing some weight gain and central obesity. She is very appropriately concerned about this.

## 2021-11-09 NOTE — Assessment & Plan Note (Signed)
She has gained about 40 pounds since last year. She is struggling with making appropriate diet changes in the last week and exercising and not seeing results. We discussed referral to healthy weight and wellness which is done today. Advised on medication options and checking HgA1c to see if she may qualify for metformin if new pre-diabetes. Options discussed include plenity, contrave, qsymia. She does not want injectable.

## 2021-11-10 ENCOUNTER — Other Ambulatory Visit: Payer: Self-pay | Admitting: Internal Medicine

## 2021-11-10 DIAGNOSIS — M5440 Lumbago with sciatica, unspecified side: Secondary | ICD-10-CM

## 2021-11-11 ENCOUNTER — Telehealth: Payer: Self-pay | Admitting: Internal Medicine

## 2021-11-11 NOTE — Telephone Encounter (Signed)
Patient calling in  Patient wants to know if provider will be putting in a order for her to have an xray done on her knee.. patient says she believes the pain in her knee is coming from something other than the weight gain  Please call patient back [phone]

## 2021-11-11 NOTE — Telephone Encounter (Signed)
Spoke with patient today. 

## 2021-11-24 ENCOUNTER — Ambulatory Visit: Payer: Federal, State, Local not specified - PPO | Admitting: Internal Medicine

## 2021-11-26 DIAGNOSIS — M5416 Radiculopathy, lumbar region: Secondary | ICD-10-CM | POA: Diagnosis not present

## 2021-11-26 DIAGNOSIS — I10 Essential (primary) hypertension: Secondary | ICD-10-CM | POA: Diagnosis not present

## 2021-11-26 DIAGNOSIS — Z6841 Body Mass Index (BMI) 40.0 and over, adult: Secondary | ICD-10-CM | POA: Diagnosis not present

## 2021-11-27 ENCOUNTER — Other Ambulatory Visit: Payer: Self-pay | Admitting: Neurosurgery

## 2021-11-27 DIAGNOSIS — M5416 Radiculopathy, lumbar region: Secondary | ICD-10-CM

## 2021-12-08 ENCOUNTER — Ambulatory Visit (INDEPENDENT_AMBULATORY_CARE_PROVIDER_SITE_OTHER): Payer: Federal, State, Local not specified - PPO | Admitting: Psychology

## 2021-12-08 ENCOUNTER — Ambulatory Visit: Payer: Federal, State, Local not specified - PPO | Admitting: Psychology

## 2021-12-08 DIAGNOSIS — F4322 Adjustment disorder with anxiety: Secondary | ICD-10-CM | POA: Diagnosis not present
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
## 2021-12-15 DIAGNOSIS — Z6841 Body Mass Index (BMI) 40.0 and over, adult: Secondary | ICD-10-CM | POA: Diagnosis not present

## 2021-12-15 DIAGNOSIS — E559 Vitamin D deficiency, unspecified: Secondary | ICD-10-CM | POA: Diagnosis not present

## 2021-12-15 DIAGNOSIS — Z131 Encounter for screening for diabetes mellitus: Secondary | ICD-10-CM | POA: Diagnosis not present

## 2021-12-15 DIAGNOSIS — E349 Endocrine disorder, unspecified: Secondary | ICD-10-CM | POA: Diagnosis not present

## 2021-12-15 DIAGNOSIS — E8881 Metabolic syndrome: Secondary | ICD-10-CM | POA: Diagnosis not present

## 2021-12-15 DIAGNOSIS — R635 Abnormal weight gain: Secondary | ICD-10-CM | POA: Diagnosis not present

## 2021-12-15 DIAGNOSIS — Z1331 Encounter for screening for depression: Secondary | ICD-10-CM | POA: Diagnosis not present

## 2021-12-15 DIAGNOSIS — E7800 Pure hypercholesterolemia, unspecified: Secondary | ICD-10-CM | POA: Diagnosis not present

## 2021-12-15 DIAGNOSIS — R0602 Shortness of breath: Secondary | ICD-10-CM | POA: Diagnosis not present

## 2021-12-15 DIAGNOSIS — Z79899 Other long term (current) drug therapy: Secondary | ICD-10-CM | POA: Diagnosis not present

## 2021-12-15 DIAGNOSIS — Z1159 Encounter for screening for other viral diseases: Secondary | ICD-10-CM | POA: Diagnosis not present

## 2021-12-15 DIAGNOSIS — D539 Nutritional anemia, unspecified: Secondary | ICD-10-CM | POA: Diagnosis not present

## 2021-12-15 DIAGNOSIS — R5383 Other fatigue: Secondary | ICD-10-CM | POA: Diagnosis not present

## 2021-12-17 ENCOUNTER — Ambulatory Visit: Payer: Federal, State, Local not specified - PPO | Admitting: Surgery

## 2021-12-29 ENCOUNTER — Ambulatory Visit (INDEPENDENT_AMBULATORY_CARE_PROVIDER_SITE_OTHER): Payer: Federal, State, Local not specified - PPO | Admitting: Psychology

## 2021-12-29 DIAGNOSIS — F4322 Adjustment disorder with anxiety: Secondary | ICD-10-CM

## 2021-12-29 NOTE — Progress Notes (Signed)
[facility] Counselor/Therapist Progress Note  Patient ID: Katherine Browning, MRN: [id number]   Date: 12/29/21  Time Spent: 2:33  pm - 3:28 pm : 55 Minutes  Treatment Type: Individual Therapy.  Reported Symptoms: anxiety and depression.   Mental Status Exam: Appearance:  Casual     Behavior: Appropriate  Motor: Normal  Speech/Language:  Clear and Coherent  Affect: Flat  Mood: dysthymic  Thought process: normal  Thought content:   WNL  Sensory/Perceptual disturbances:   WNL  Orientation: oriented to person, place, time/date, and situation  Attention: Good  Concentration: Good  Memory: WNL  Fund of knowledge:  Good  Insight:   Good  Judgment:  Good  Impulse Control: Good   Risk Assessment: Danger to Self:  No Self-injurious Behavior: No Danger to Others: No Duty to Warn:no Physical Aggression / Violence:No  Access to Firearms a concern: No  Gang Involvement:No   In case of a mental health emergency:  8 - confidential suicide hotline. [facility]):        Katherine Browning, [place] [address]       [phone] 3.   911  4.   Visiting Nearest ED.   Subjective:   Katherine Browning participated from car, via video and consented to treatment. Therapist participated from home office. We met online due to [place] pandemic. Katherine Browning reviewed the events of the past week. We reviewed numerous treatment approaches including CBT, BA, Problem Solving, and Solution focused therapy. Psych-education regarding the Katherine Browning's diagnosis of Adjustment disorder with anxious mood was provided during the session. We discussed Katherine Browning goals treatment goals which include a need to process numerous unexpected losses including mother, brother, and husband.  Katherine Browning would like to bolster coping skills, manage unexpected stressors, reduce rumination, manage overall mood, process past events and losses, and manage her irritability. Katherine Browning  provided verbal approval of the treatment plan.   Interventions: Psycho-education & Goal Setting.   Diagnosis:  Adjustment disorder with anxious mood   Treatment Plan:  Client Abilities/Strengths Katherine Browning is forthcoming and motivated for change.   Support System: Family and friends.   Client Treatment Preferences Outpatient therapy.   Client Statement of Needs Katherine Browning would like to bolster coping skills, manage unexpected stressors, reduce rumination, manage overall mood, process past events and losses.   Treatment Level Weekly  Barriers:  Health, mobility,   Symptoms Depression: Loss of interest, feeling down, lethargy, fluctuating appetite, fluctuating sleep, feeling back about self, difficulty concentrating,  hx of suicidal ideation, psychomotor retardation.  (Status: maintained)  Anxiety:  Feeling nervous, difficulty managing worry, worrying about different things, rumination, restlessness, irritability, feeling afraid something awful might happen, muscle tension.    (Status: maintained)  Goals:   Katherine Browning experiences symptoms of depression and anxiety.   Target Date: 12/29/22 Frequency: Weekly  Progress: 0 Modality: individual    Therapist will provide referrals for additional resources as appropriate.  Therapist will provide psycho-education regarding Katherine Browning's diagnosis and corresponding treatment approaches and interventions. Licensed Clinical Social Worker, [name], LCSW will support the patient's ability to achieve the goals identified. will employ CBT, BA, Problem-solving, Solution Focused, Mindfulness,  coping skills, & other evidenced-based practices will be used to promote progress towards healthy functioning to help manage decrease symptoms associated with her diagnosis.   Reduce overall level, frequency, and intensity of the feelings of depression and anxiety, evidenced by decreased her overall symptoms from 6 to 7 days/week to 0 to 1 days/week per  client report for  at least 3 consecutive months. Verbally express understanding of the relationship between feelings of depression, anxiety and their impact on thinking patterns and behaviors. Verbalize an understanding of the role that distorted thinking plays in creating fears, excessive worry, and ruminations.  Katherine Browning participated in the creation of the treatment plan)   Buena Irish, LCSW

## 2022-01-14 DIAGNOSIS — Z6841 Body Mass Index (BMI) 40.0 and over, adult: Secondary | ICD-10-CM | POA: Diagnosis not present

## 2022-01-14 DIAGNOSIS — R7303 Prediabetes: Secondary | ICD-10-CM | POA: Diagnosis not present

## 2022-01-14 DIAGNOSIS — R635 Abnormal weight gain: Secondary | ICD-10-CM | POA: Diagnosis not present

## 2022-01-14 DIAGNOSIS — E785 Hyperlipidemia, unspecified: Secondary | ICD-10-CM | POA: Diagnosis not present

## 2022-01-17 ENCOUNTER — Telehealth: Payer: Self-pay | Admitting: Internal Medicine

## 2022-01-17 NOTE — Telephone Encounter (Signed)
Pt requesting a c/b, pt would not go into details on nature of the call °

## 2022-01-21 ENCOUNTER — Ambulatory Visit: Payer: Federal, State, Local not specified - PPO | Admitting: Internal Medicine

## 2022-01-21 ENCOUNTER — Encounter: Payer: Self-pay | Admitting: Internal Medicine

## 2022-01-21 ENCOUNTER — Other Ambulatory Visit: Payer: Self-pay | Admitting: Internal Medicine

## 2022-01-21 ENCOUNTER — Other Ambulatory Visit: Payer: Self-pay

## 2022-01-21 DIAGNOSIS — M79672 Pain in left foot: Secondary | ICD-10-CM | POA: Insufficient documentation

## 2022-01-21 MED ORDER — COLCHICINE 0.6 MG PO TABS: 0.6000 mg | ORAL_TABLET | Freq: Every day | ORAL | 0 refills | Status: DC

## 2022-01-21 NOTE — Assessment & Plan Note (Signed)
She had gout flare many years ago and this is similar. This started about 2 weeks ago and was very intense. She was concerned that this is improving but not gone. Rx colchicine 2 week course and continue ibuprofen otc for pain. Ordered uric acid level to check in 2-3 weeks. She did start diethylpropion about 1 month prior and is unsure if this contributed and has since stopped this medication. It is possible this is gout, pseudogout, RA flare.  ?

## 2022-01-21 NOTE — Patient Instructions (Signed)
We have sent in colchicine to take 1 pill daily for 2 weeks to help this gout flare up resolve and not come back. ? ?In 2-3 weeks come to the lab for blood work to check the uric acid so we can see if you may be at risk for another flare of gout. ? ? ?

## 2022-01-21 NOTE — Progress Notes (Signed)
? ?  Subjective:  ? ?Patient ID: Calirose Dubuisson, female    DOB: Oct 17, 1966, 56 y.o.   MRN: [id number] ? ?HPI ?The patient is a 56 YO female coming in for concerns. ? ?Review of Systems  ?Constitutional: Negative.   ?HENT: Negative.    ?Eyes: Negative.   ?Respiratory:  Negative for cough, chest tightness and shortness of breath.   ?Cardiovascular:  Negative for chest pain, palpitations and leg swelling.  ?Gastrointestinal:  Negative for abdominal distention, abdominal pain, constipation, diarrhea, nausea and vomiting.  ?Musculoskeletal:  Positive for arthralgias and myalgias.  ?Skin: Negative.   ?Neurological: Negative.   ?Psychiatric/Behavioral: Negative.    ? ?Objective:  ?Physical Exam ?Constitutional:   ?   Appearance: She is well-developed. She is obese.  ?HENT:  ?   Head: Normocephalic and atraumatic.  ?Cardiovascular:  ?   Rate and Rhythm: Normal rate and regular rhythm.  ?Pulmonary:  ?   Effort: Pulmonary effort is normal. No respiratory distress.  ?   Breath sounds: Normal breath sounds. No wheezing or rales.  ?Abdominal:  ?   General: Bowel sounds are normal. There is no distension.  ?   Palpations: Abdomen is soft.  ?   Tenderness: There is no abdominal tenderness. There is no rebound.  ?Musculoskeletal:     ?   General: Tenderness present.  ?   Cervical back: Normal range of motion.  ?   Comments: Mild tenderness left foot, most with weight bearing to the left heel  ?Skin: ?   General: Skin is warm and dry.  ?Neurological:  ?   Mental Status: She is alert and oriented to person, place, and time.  ?   Coordination: Coordination normal.  ? ? ?Vitals:  ? 01/21/22 1017  ?BP: 128/70  ?Pulse: 84  ?Resp: 18  ?SpO2: 98%  ?Weight: (!) 324 lb 3.2 oz (147.1 kg)  ?Height: 5' 5.5" (1.664 m)  ? ? ?This visit occurred during the SARS-CoV-2 public health emergency.  Safety protocols were in place, including screening questions prior to the visit, additional usage of staff PPE, and extensive cleaning of exam room while  observing appropriate contact time as indicated for disinfecting solutions.  ? ?Assessment & Plan:  ? ?

## 2022-01-24 ENCOUNTER — Ambulatory Visit: Payer: Federal, State, Local not specified - PPO | Admitting: Psychology

## 2022-02-09 ENCOUNTER — Other Ambulatory Visit: Payer: Self-pay | Admitting: Internal Medicine

## 2022-02-15 ENCOUNTER — Telehealth: Payer: Self-pay

## 2022-02-15 NOTE — Telephone Encounter (Signed)
Pt calling to report she stop colchicine 0.6 MG tablet due to it causing upset stomach. ? ?Pt is wanting to know what injectable weight loss regimen she can get. Pt will be calling insurance to see what is covered and calling back to give the options. ? ?FYI ?

## 2022-02-17 NOTE — Telephone Encounter (Signed)
Noted  

## 2022-02-28 ENCOUNTER — Ambulatory Visit: Payer: Federal, State, Local not specified - PPO | Admitting: Internal Medicine

## 2022-03-01 ENCOUNTER — Telehealth: Payer: Self-pay | Admitting: Internal Medicine

## 2022-03-01 NOTE — Telephone Encounter (Signed)
Appt scheduled

## 2022-03-01 NOTE — Telephone Encounter (Signed)
Attempted to contact pt regarding r/s appointment scheduled for 4.10.2023. She states she had been calling since yesterday.  ? ?Pt initially stated she did not want to wait until April 17(next available) with PCP. I then informed the pt that I could get her in with another provider earlier, and she stated "Well I may as well wait until Monday"  ? ?Appt was pinned at 10am but then appt availability went away. Attempted to call pt to let her know and phone went to voicemail. Left message.  ? ?Appt not scheduled.  ?

## 2022-03-07 ENCOUNTER — Encounter: Payer: Self-pay | Admitting: Internal Medicine

## 2022-03-07 ENCOUNTER — Ambulatory Visit: Payer: Federal, State, Local not specified - PPO | Admitting: Internal Medicine

## 2022-03-07 DIAGNOSIS — M069 Rheumatoid arthritis, unspecified: Secondary | ICD-10-CM

## 2022-03-07 DIAGNOSIS — G8929 Other chronic pain: Secondary | ICD-10-CM | POA: Diagnosis not present

## 2022-03-07 DIAGNOSIS — M25562 Pain in left knee: Secondary | ICD-10-CM | POA: Diagnosis not present

## 2022-03-07 MED ORDER — PREDNISONE 20 MG PO TABS: 40.0000 mg | ORAL_TABLET | Freq: Every day | ORAL | 0 refills | Status: DC

## 2022-03-07 MED ORDER — OXYCODONE-ACETAMINOPHEN 5-325 MG PO TABS: 1.0000 | ORAL_TABLET | ORAL | 0 refills | Status: AC | PRN

## 2022-03-07 NOTE — Patient Instructions (Addendum)
For weight loss:  ? ?Pills: contrave, qsymia, plenity ? ?Injection: wegovy (weekly), saxenda (daily) ? ?We have sent in prednisone to take 2 pills daily for 5 days. Let us know if this is helping in 2-3 days. ? ?Let us know how we can help. ? ?We have sent in the percocet for pain  ?

## 2022-03-07 NOTE — Progress Notes (Signed)
? ?  Subjective:  ? ?Patient ID: Katherine Browning, female    DOB: July 31, 1966, 56 y.o.   MRN: [id number] ? ?HPI ?The patient is a 56 YO female coming in for concerns ongoing about joint pains. She is not satisfied with some of her recent care and would like to see what we can do to get this adjusted.  ? ?Review of Systems  ?Constitutional:  Positive for activity change. Negative for appetite change, chills, fatigue, fever and unexpected weight change.  ?Respiratory: Negative.    ?Cardiovascular: Negative.   ?Gastrointestinal: Negative.   ?Musculoskeletal:  Positive for arthralgias, back pain and myalgias. Negative for gait problem and joint swelling.  ?Skin: Negative.   ?Neurological: Negative.   ? ?Objective:  ?Physical Exam ?Constitutional:   ?   Appearance: Normal appearance. She is obese.  ?HENT:  ?   Head: Normocephalic.  ?Cardiovascular:  ?   Rate and Rhythm: Normal rate and regular rhythm.  ?Pulmonary:  ?   Effort: Pulmonary effort is normal.  ?Musculoskeletal:     ?   General: Tenderness present. Normal range of motion.  ?Skin: ?   General: Skin is warm and dry.  ?Neurological:  ?   General: No focal deficit present.  ?   Mental Status: She is alert and oriented to person, place, and time.  ? ? ?Vitals:  ? 03/07/22 1348  ?BP: 130/82  ?Pulse: 88  ?Resp: 18  ?SpO2: 98%  ?Weight: (!) 330 lb (149.7 kg)  ?Height: 5' 5.5" (1.664 m)  ? ? ?This visit occurred during the SARS-CoV-2 public health emergency.  Safety protocols were in place, including screening questions prior to the visit, additional usage of staff PPE, and extensive cleaning of exam room while observing appropriate contact time as indicated for disinfecting solutions.  ? ?Assessment & Plan:  ? ?

## 2022-03-10 ENCOUNTER — Ambulatory Visit: Payer: Federal, State, Local not specified - PPO

## 2022-03-10 ENCOUNTER — Ambulatory Visit (INDEPENDENT_AMBULATORY_CARE_PROVIDER_SITE_OTHER): Payer: Federal, State, Local not specified - PPO | Admitting: Surgery

## 2022-03-10 ENCOUNTER — Encounter: Payer: Self-pay | Admitting: Surgery

## 2022-03-10 VITALS — BP 164/107 | Ht 65.5 in | Wt 330.0 lb

## 2022-03-10 DIAGNOSIS — M25562 Pain in left knee: Secondary | ICD-10-CM | POA: Diagnosis not present

## 2022-03-10 DIAGNOSIS — M1712 Unilateral primary osteoarthritis, left knee: Secondary | ICD-10-CM | POA: Diagnosis not present

## 2022-03-10 DIAGNOSIS — M4726 Other spondylosis with radiculopathy, lumbar region: Secondary | ICD-10-CM

## 2022-03-10 DIAGNOSIS — R29818 Other symptoms and signs involving the nervous system: Secondary | ICD-10-CM | POA: Diagnosis not present

## 2022-03-10 NOTE — Progress Notes (Signed)
? ?Office Visit Note ?  ?Patient: Katherine Browning           ?Date of Birth: 01-14-1966           ?MRN: [id number] ?Visit Date: 03/10/2022 ?             ?Requested by: Hoyt Koch, MD ?[place],  [place] [address] ?PCP: Hoyt Koch, MD ? ? ?Assessment & Plan: ?Visit Diagnoses:  ?1. Acute pain of left knee   ?2. Other spondylosis with radiculopathy, lumbar region   ?3. Neurogenic claudication   ?4. Primary osteoarthritis of left knee   ? ? ?Plan: With patient's ongoing chronic issues with her lumbar spine and failed conservative treatment I recommend getting MRI lumbar and comparing to the previous study that was done April 2015.  I will have her follow-up with Dr. Louanne Skye to review that study in a few weeks and to discuss further treatment options.  In regards to her ongoing left knee issues I will have her follow-up with Dr. Lorin Mercy to discuss treatment options there.  All questions answered.  Patient will follow-up with primary care doctor since they are ordering repeat labs for gout. ? ?Follow-Up Instructions: Return in about 4 weeks (around 04/07/2022) for WITH DR NITKA 4 WEEKS REVIEW LUMBAR MRI SCAN AND DR YATES 5 WEEKS FOR EVALUATION LEFT KNEE.  ? ?Orders:  ?Orders Placed This Encounter  ?Procedures  ? XR Knee 1-2 Views Left  ? MR Lumbar Spine w/o contrast  ? ?No orders of the defined types were placed in this encounter. ? ? ? ? Procedures: ?No procedures performed ? ? ?Clinical Data: ?No additional findings. ? ? ?Subjective: ?Chief Complaint  ?Patient presents with  ? Left Leg - Pain  ? Lower Back - Pain  ? ? ?HPI ?56 year old black female comes in today with complaints of chronic low back pain, left lower extremity radiculopathy and left knee pain.  I reviewed patient's chart and she was seen by Dr. Louanne Skye in 2015.  He did order an MRI scan that was done March 19, 2014 and that study showed: ? ?CLINICAL DATA:  Low back and bilateral buttock and right leg pain.  ? ?EXAM:  ?MRI LUMBAR  SPINE WITHOUT CONTRAST  ? ?TECHNIQUE:  ?Multiplanar, multisequence MR imaging of the lumbar spine was  ?performed. No intravenous contrast was administered.  ? ?COMPARISON:  06/17/2009.  ? ?FINDINGS:  ?Examination is somewhat limited by body habitus and motion.  ? ?Normal and stable alignment of the lumbar vertebral bodies. They  ?demonstrate grossly normal marrow signal. The conus medullaris  ?terminates at L1. The facets are normally aligned. No pars defects.  ? ?No significant paraspinal or retroperitoneal findings. A left  ?ovarian cyst is noted.  ? ?L1-2:  No significant findings.  ? ?L2-3: Mild hypertrophic facet changes but no focal disc protrusion,  ?spinal or foraminal stenosis.  ? ?L3-4: Mild hypertrophic facet changes but no focal disc protrusion,  ?spinal or foraminal stenosis.  ? ?L4-5: Shallow right paracentral disc protrusion with mild mass  ?effect on the right side of the thecal sac with potential mild  ?impingement on the right L5 nerve root in the lateral recess. No  ?spinal or foraminal stenosis. Since the prior study there has been  ?Mild desiccation and retraction of this disc protrusion.  ? ?L5-S1: Shallow left paracentral disc protrusion with mild mass  ?effect on the left side of the thecal sac and possible impingement  ?on the left S1  nerve root. On the prior study this was more of a  ?broad-based disc protrusion without left-sided focality. There is  ?progressive facet disease, left greater than right. No foraminal  ?stenosis. .  ? ?IMPRESSION:  ?1. Shallow right paracentral disc protrusion at L4-5 with potential  ?mild impingement on the right L5 nerve root in the lateral recess.  ?There has been desiccation and retraction of this disc protrusion  ?since the prior MRI.  ?2. Shallow broad-based left paracentral disc protrusion at L5-S1  ?possibly impinging on the left S1 nerve root. This was more of a  ?broad-based disc protrusion on the prior study.  ? ? ?Electronically Signed  ?  By:  Katherine Browning M.D.  ?  On: 03/20/2014 08:44  ? ?I asked my assistant to pull up Dr. Barbaraann Browning notes from our previous system back in 2015 and at that time he did mention treatment options of lumbar ESI's and possibly surgical invention.  Patient states that she did not want to do either one.  States that she has constant low back pain that radiates down the left leg.  Left leg feels "heavy".  Also has chronic pain in the left knee with ambulation.  Ongoing issues in the left ankle as well.  Primary care evaluating patient for gout.  She does state that she has a known history of gout.  She has been ambulating with a cane.  She had lumbar spine x-rays that were done and ordered by her PCP November 08, 2021 and that showed: ? ?CLINICAL DATA:  Chronic low back pain radiating into both hips. Low ?back pain with sciatica. Back pain for months. ?  ?EXAM: ?LUMBAR SPINE - COMPLETE 4+ VIEW ?  ?COMPARISON:  Prior radiographs most recently 07/28/2018 ?  ?FINDINGS: ?Five non rib-bearing lumbar vertebra. Normal alignment. No ?listhesis. Vertebral body heights are normal. Disc space narrowing ?and endplate spurring at L5-S1, with mild progression from prior ?exam. Minor L4-L5 disc space narrowing. L5-S1 facet hypertrophy. ?There is no evidence of fracture or focal bone abnormality. The ?sacroiliac joints are congruent. Soft tissue attenuation from ?habitus partially obscures detailed assessment. ?  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 ?  ?  ?Electronically Signed ?  By: Katherine Browning M.D. ?  On: 11/08/2021 10:40 ? ?Patient stated that she did see a neurosurgeon in January 2023 and he did order a lumbar MRI but she did not have that done since she was having issues with her left ankle gout. ? ?Review of Systems ?No current cardiac pulmonary GI GU issues ? ?Objective: ?Vital Signs: BP (!) 164/107 (BP Location: Left Arm)   Ht 5' 5.5"  (1.664 m)   Wt (!) 330 lb (149.7 kg)   LMP 02/19/2018 Comment: neg preg test  BMI 54.08 kg/m?  ? ?Physical Exam ?Constitutional:   ?   Appearance: She is obese.  ?HENT:  ?   Head: Normocephalic and atraumatic.  ?   Nose: Nose normal.  ?Eyes:  ?   Extraocular Movements: Extraocular movements intact.  ?Pulmonary:  ?   Effort: Pulmonary effort is normal. No respiratory distress.  ?Musculoskeletal:  ?   Comments: Gait is antalgic.  Negative logroll bilateral hips.  Positive left straight leg raise.  Left knee positive patellofemoral crepitus.  Medial and lateral joint line tenderness.  No focal motor deficits.  ?Neurological:  ?   Mental Status: She is alert and oriented to person, place, and  time.  ?Psychiatric:     ?   Mood and Affect: Mood normal.  ? ? ?Ortho Exam ? ?Specialty Comments:  ?No specialty comments available. ? ?Imaging: ?No results found. ? ? ?PMFS History: ?Patient Active Problem List  ? Diagnosis Date Noted  ? Left foot pain 01/21/2022  ? Adjustment disorder 11/09/2021  ? Weight gain 04/30/2020  ? Low back pain 10/06/2017  ? Acute rheumatoid arthritis ([place]) 02/11/2016  ? Arthritis 05/01/2015  ? Morbid obesity ([place]) 10/27/2014  ? Essential hypertension 10/27/2014  ? Knee pain, left 10/27/2014  ? Routine general medical examination at a health care facility 10/27/2014  ? ?Past Medical History:  ?Diagnosis Date  ? Back pain   ? Hypertension   ? Knee pain   ?  ?Family History  ?Problem Relation Age of Onset  ? Hypertension Other   ?  ?Past Surgical History:  ?Procedure Laterality Date  ? ECTOPIC PREGNANCY SURGERY    ? ?Social History  ? ?Occupational History  ? Not on file  ?Tobacco Use  ? Smoking status: Never  ? Smokeless tobacco: Never  ?Substance and Sexual Activity  ? Alcohol use: No  ?  Comment: occ  ? Drug use: No  ? Sexual activity: Not on file  ? ? ? ? ? ? ?

## 2022-03-11 NOTE — Assessment & Plan Note (Signed)
Very severe and seeing orthopedic later this week. Rx short term oxycodone for pain relief as she has tried meloxicam and flexeril which did not help. [place] database reviewed.  ?

## 2022-03-11 NOTE — Assessment & Plan Note (Addendum)
Could be having a flare up currently. She has not followed with rheumatology for some time. It is unclear if this is the cause of her joint pain as she is having pain in the low back, legs, knees. Rx prednisone 5 day course to see if this improves symptoms significantly. She does not have the resources to see multiple specialists so would like to see ortho this week later and then additional only if needed.  ?

## 2022-03-15 ENCOUNTER — Telehealth: Payer: Self-pay | Admitting: Internal Medicine

## 2022-03-15 NOTE — Telephone Encounter (Signed)
This is what's called a burst dosing of prednisone and should only be taken for 5 days. The whole prescription has already been prescribed for her. ?

## 2022-03-15 NOTE — Telephone Encounter (Signed)
Pt making provider aware the prednisone is helping ? ?Pt inquiring if the remainder tablets can be sent to pharmacy on file ? ?Please advise ? ? ?

## 2022-03-29 ENCOUNTER — Ambulatory Visit
Admission: RE | Admit: 2022-03-29 | Discharge: 2022-03-29 | Disposition: A | Discharge status: Home / Self Care | Payer: Federal, State, Local not specified - PPO | Source: Ambulatory Visit | Attending: Surgery | Admitting: Surgery

## 2022-03-29 DIAGNOSIS — M545 Low back pain, unspecified: Secondary | ICD-10-CM | POA: Diagnosis not present

## 2022-03-29 DIAGNOSIS — M4726 Other spondylosis with radiculopathy, lumbar region: Secondary | ICD-10-CM

## 2022-03-29 DIAGNOSIS — R29818 Other symptoms and signs involving the nervous system: Secondary | ICD-10-CM

## 2022-04-06 ENCOUNTER — Encounter: Payer: Self-pay | Admitting: Specialist

## 2022-04-06 ENCOUNTER — Ambulatory Visit: Payer: Self-pay

## 2022-04-06 ENCOUNTER — Ambulatory Visit: Payer: Federal, State, Local not specified - PPO | Admitting: Specialist

## 2022-04-06 VITALS — BP 145/74 | HR 74 | Ht 65.5 in | Wt 330.0 lb

## 2022-04-06 DIAGNOSIS — M4726 Other spondylosis with radiculopathy, lumbar region: Secondary | ICD-10-CM

## 2022-04-06 DIAGNOSIS — R29818 Other symptoms and signs involving the nervous system: Secondary | ICD-10-CM | POA: Diagnosis not present

## 2022-04-06 DIAGNOSIS — M25562 Pain in left knee: Secondary | ICD-10-CM

## 2022-04-06 DIAGNOSIS — M1712 Unilateral primary osteoarthritis, left knee: Secondary | ICD-10-CM

## 2022-04-06 NOTE — Progress Notes (Signed)
? ?Office Visit Note ?  ?Patient: Katherine Browning           ?Date of Birth: Dec 30, 1965           ?MRN: [id number] ?Visit Date: 04/06/2022 ?             ?Requested by: Myrlene Broker, MD ?[address] ?[place],  [place] [address] ?PCP: Myrlene Broker, MD ? ? ?Assessment & Plan: ?Visit Diagnoses:  ?1. Other spondylosis with radiculopathy, lumbar region   ?2. Neurogenic claudication   ?3. Acute pain of left knee   ?4. Primary osteoarthritis of left knee   ? ?Plan: Knee is suffering from osteoarthritis, only real proven treatments are ?Weight loss, NSIADs like diclofenac and exercise. ?Well padded shoes help. ?Ice the knee that is suffering from osteoarthritis, only real proven treatments are ?Weight loss, NSIADs like diclofenac and exercise. ?Well padded shoes help. ?Ice the knee 2-3 times a day 15-20 mins at a time.3 times a day 15-20 mins at a time. Hot showers in the AM.  ?Injection with steroid may be of benefit. ?Hemp CBD capsules, amazon.com 5,000-7,000 mg per bottle, 60 capsules per bottle, take one capsule twice a day. ?Cane in the left hand to use with left leg weight bearing. ?Follow-Up Instructions: No follow-ups on file.   ? ?Follow-Up Instructions: No follow-ups on file.  ? ?Orders:  ?Orders Placed This Encounter  ?Procedures  ? XR Lumbar Spine 2-3 Views  ? ?No orders of the defined types were placed in this encounter. ? ? ? ? Procedures: ?No procedures performed ? ? ?Clinical Data: ?No additional findings. ? ? ?Subjective: ?Chief Complaint  ?Patient presents with  ? Lower Back - Pain  ? ? ?56 year old female with history of increasing left knee with stiffness, pain and difficulty walking. She has AM pain and weakness in the left knee, it wants to give away. Underwent recent MRI of the lumbar spine for sciatica and posterior buttock, thigh and leg pain. She has been to PT about 2007-8 and also did pool exercises up until the pandemic, most recently she had to stop pool exercises at [facility], as  the facelity closed its pool and then also  ?Gained near 100 lbs. Her primary care MD has not  ?Referred her to weight reduction clinic or  ?To a bariatric surgery solution.  ? ?Review of Systems  ?Constitutional: Negative.   ?HENT: Negative.    ?Eyes: Negative.   ?Respiratory: Negative.    ?Cardiovascular: Negative.   ?Gastrointestinal: Negative.   ?Endocrine: Negative.   ?Genitourinary: Negative.   ?Musculoskeletal: Negative.   ?Skin: Negative.   ?Allergic/Immunologic: Negative.   ?Neurological: Negative.   ?Hematological: Negative.   ?Psychiatric/Behavioral: Negative.    ? ? ?Objective: ?Vital Signs: BP (!) 145/74 (BP Location: Left Arm, Patient Position: Sitting)   Pulse 74   Ht 5' 5.5" (1.664 m)   Wt (!) 330 lb (149.7 kg)   LMP 02/19/2018 Comment: neg preg test  BMI 54.08 kg/m?  ? ?Physical Exam ?Constitutional:   ?   Appearance: She is well-developed.  ?HENT:  ?   Head: Normocephalic and atraumatic.  ?Eyes:  ?   Pupils: Pupils are equal, round, and reactive to light.  ?Pulmonary:  ?   Effort: Pulmonary effort is normal.  ?   Breath sounds: Normal breath sounds.  ?Abdominal:  ?   General: Bowel sounds are normal.  ?   Palpations: Abdomen is soft.  ?Musculoskeletal:  ?  Cervical back: Normal range of motion and neck supple.  ?   Lumbar back: Negative right straight leg raise test and negative left straight leg raise test.  ?Skin: ?   General: Skin is warm and dry.  ?Neurological:  ?   Mental Status: She is alert and oriented to person, place, and time.  ?Psychiatric:     ?   Behavior: Behavior normal.     ?   Thought Content: Thought content normal.     ?   Judgment: Judgment normal.  ? ?Back Exam  ? ?Tenderness  ?The patient is experiencing tenderness in the lumbar. ? ?Range of Motion  ?Extension:  abnormal  ?Lateral bend right:  abnormal  ?Rotation right:  abnormal  ?Rotation left:  abnormal  ? ?Muscle Strength  ?Right Quadriceps:  5/5  ?Left Quadriceps:  5/5  ?Right Hamstrings:  5/5  ?Left Hamstrings:   5/5  ? ?Tests  ?Straight leg raise right: negative ?Straight leg raise left: negative ? ?Reflexes  ?Patellar:  2/4 ?Achilles:  2/4 ? ?Other  ?Toe walk: normal ?Heel walk: normal ? ? ? ?Specialty Comments:  ?No specialty comments available. ? ?Imaging: ?No results found. ? ? ?PMFS History: ?Patient Active Problem List  ? Diagnosis Date Noted  ? Left foot pain 01/21/2022  ? Adjustment disorder 11/09/2021  ? Weight gain 04/30/2020  ? Low back pain 10/06/2017  ? Acute rheumatoid arthritis (HCC) 02/11/2016  ? Arthritis 05/01/2015  ? Morbid obesity (HCC) 10/27/2014  ? Essential hypertension 10/27/2014  ? Knee pain, left 10/27/2014  ? Routine general medical examination at a health care facility 10/27/2014  ? ?Past Medical History:  ?Diagnosis Date  ? Back pain   ? Hypertension   ? Knee pain   ?  ?Family History  ?Problem Relation Age of Onset  ? Hypertension Other   ?  ?Past Surgical History:  ?Procedure Laterality Date  ? ECTOPIC PREGNANCY SURGERY    ? ?Social History  ? ?Occupational History  ? Not on file  ?Tobacco Use  ? Smoking status: Never  ? Smokeless tobacco: Never  ?Substance and Sexual Activity  ? Alcohol use: No  ?  Comment: occ  ? Drug use: No  ? Sexual activity: Not on file  ? ? ? ? ? ? ?

## 2022-04-06 NOTE — Patient Instructions (Addendum)
?  Plan: Knee is suffering from osteoarthritis, only real proven treatments are ?Weight loss, NSIADs like diclofenac and exercise. ?Well padded shoes help. ?Ice the knee that is suffering from osteoarthritis, only real proven treatments are ?Weight loss, NSIADs like diclofenac and exercise. ?Well padded shoes help. ?Ice the knee 2-3 times a day 15-20 mins at a time.3 times a day 15-20 mins at a time. Hot showers in the AM.  ?Injection with steroid may be of benefit. ?Hemp CBD capsules, amazon.com 5,000-7,000 mg per bottle, 60 capsules per bottle, take one capsule twice a day. ?Cane in the left hand to use with left leg weight bearing. ?Follow-Up Instructions: No follow-ups on file  ? ?Copyright ? VHI. All rights reserved.  ?ROM: Plantar / Dorsiflexion ? ? ?With left leg relaxed, gently flex and extend ankle. Move through full range of motion. Avoid pain. ?Repeat ____ times per set. Do ____ sets per session. Do ____ sessions per day. ?  ?http://orth.exer.us/34  ? ?Also issued SLR supine and RT side lying 10 reps 2 x/day an Quad sets x10 5 sec hold 2x/day      Also asked pt to ice 2x/day for 15 min more medial aspect of  LT knee.   ? ?Copyright ? VHI. All rights reserved.  ?ROM: Plantar / Dorsiflexion ? ? ?With left leg relaxed, gently flex and extend ankle. Move through full range of motion. Avoid pain. ?Repeat ____ times per set. Do ____ sets per session. Do ____ sessions per day. ?  ?http://orth.exer.us/34  ? ?Also issued SLR supine and RT side lying 10 reps 2 x/day an Quad sets x10 5 sec hold 2x/day      Also asked pt to ice 2x/day for 15 min more medial aspect of  LT knee.  ?

## 2022-04-12 ENCOUNTER — Ambulatory Visit (INDEPENDENT_AMBULATORY_CARE_PROVIDER_SITE_OTHER): Payer: Federal, State, Local not specified - PPO | Admitting: Orthopaedic Surgery

## 2022-04-12 ENCOUNTER — Encounter: Payer: Self-pay | Admitting: Orthopaedic Surgery

## 2022-04-12 DIAGNOSIS — M1712 Unilateral primary osteoarthritis, left knee: Secondary | ICD-10-CM

## 2022-04-12 DIAGNOSIS — M25562 Pain in left knee: Secondary | ICD-10-CM | POA: Diagnosis not present

## 2022-04-12 NOTE — Progress Notes (Unsigned)
   Office Visit Note   Patient: Katherine Browning           Date of Birth: [birth year]           MRN: [id number] Visit Date: 04/12/2022              Requested by: Myrlene Broker, MD [address] [place],  [place] [address] PCP: Myrlene Broker, MD   Assessment & Plan: Visit Diagnoses: No diagnosis found.  Plan: ***  Follow-Up Instructions: No follow-ups on file.   Orders:  No orders of the defined types were placed in this encounter.  No orders of the defined types were placed in this encounter.     Procedures: No procedures performed   Clinical Data: No additional findings.   Subjective: Chief Complaint  Patient presents with   Left Knee - Pain, Follow-up    HPI  Review of Systems   Objective: Vital Signs: Ht 5' 5.5" (1.664 m)   Wt (!) 339 lb (153.8 kg)   LMP 02/19/2018 Comment: neg preg test  BMI 55.55 kg/m   Physical Exam  Ortho Exam  Specialty Comments:  No specialty comments available.  Imaging: No results found.   PMFS History: Patient Active Problem List   Diagnosis Date Noted   Left foot pain 01/21/2022   Adjustment disorder 11/09/2021   Weight gain 04/30/2020   Low back pain 10/06/2017   Acute rheumatoid arthritis (HCC) 02/11/2016   Arthritis 05/01/2015   Morbid obesity (HCC) 10/27/2014   Essential hypertension 10/27/2014   Knee pain, left 10/27/2014   Routine general medical examination at a health care facility 10/27/2014   Past Medical History:  Diagnosis Date   Back pain    Hypertension    Knee pain     Family History  Problem Relation Age of Onset   Hypertension Other     Past Surgical History:  Procedure Laterality Date   ECTOPIC PREGNANCY SURGERY     Social History   Occupational History   Not on file  Tobacco Use   Smoking status: Never   Smokeless tobacco: Never  Substance and Sexual Activity   Alcohol use: No    Comment: occ   Drug use: No   Sexual activity: Not on file

## 2022-04-13 ENCOUNTER — Other Ambulatory Visit (INDEPENDENT_AMBULATORY_CARE_PROVIDER_SITE_OTHER): Payer: Federal, State, Local not specified - PPO

## 2022-04-13 DIAGNOSIS — M1712 Unilateral primary osteoarthritis, left knee: Secondary | ICD-10-CM

## 2022-04-13 DIAGNOSIS — M79672 Pain in left foot: Secondary | ICD-10-CM | POA: Diagnosis not present

## 2022-04-13 LAB — URIC ACID: Uric Acid, Serum: 10.4 mg/dL — ABNORMAL HIGH (ref 2.4–7.0)

## 2022-04-13 MED ORDER — LIDOCAINE HCL 1 % IJ SOLN
0.5000 mL | INTRAMUSCULAR | Status: AC | PRN
  Administered 2022-04-13: .5 mL

## 2022-04-13 MED ORDER — METHYLPREDNISOLONE ACETATE 40 MG/ML IJ SUSP
40.0000 mg | INTRAMUSCULAR | Status: AC | PRN
  Administered 2022-04-13: 40 mg via INTRA_ARTICULAR

## 2022-04-13 MED ORDER — BUPIVACAINE HCL 0.5 % IJ SOLN
3.0000 mL | INTRAMUSCULAR | Status: AC | PRN
  Administered 2022-04-13: 3 mL via INTRA_ARTICULAR

## 2022-04-15 ENCOUNTER — Telehealth: Payer: Self-pay | Admitting: Internal Medicine

## 2022-04-15 ENCOUNTER — Other Ambulatory Visit: Payer: Self-pay | Admitting: Internal Medicine

## 2022-04-15 ENCOUNTER — Telehealth: Payer: Self-pay | Admitting: Orthopaedic Surgery

## 2022-04-15 DIAGNOSIS — E790 Hyperuricemia without signs of inflammatory arthritis and tophaceous disease: Secondary | ICD-10-CM

## 2022-04-15 MED ORDER — COLCHICINE 0.6 MG PO TABS: 0.6000 mg | ORAL_TABLET | Freq: Every day | ORAL | 0 refills | Status: DC

## 2022-04-15 MED ORDER — PREDNISONE 20 MG PO TABS: 40.0000 mg | ORAL_TABLET | Freq: Every day | ORAL | 0 refills | Status: DC

## 2022-04-15 MED ORDER — ALLOPURINOL 300 MG PO TABS: 300.0000 mg | ORAL_TABLET | Freq: Every day | ORAL | 1 refills | Status: DC

## 2022-04-15 NOTE — Telephone Encounter (Signed)
Patient would [facility] a rx [facility]one [facility] [address]

## 2022-04-15 NOTE — Telephone Encounter (Signed)
I tried to reach patient, no answer. Dr. Ophelia Charter is not in office until next week to advise.

## 2022-04-15 NOTE — Telephone Encounter (Signed)
See result note.  

## 2022-04-15 NOTE — Telephone Encounter (Signed)
Patient called advised her PCP just prescribed allopurinol for her and not Prednisone. Patient said her PCP would not prescribe Prednisone for her. Patient asked if Dr Ophelia CharterYates would prescribe Prednisone for her? The number to contact patient is [phone]

## 2022-04-19 DIAGNOSIS — Z6841 Body Mass Index (BMI) 40.0 and over, adult: Secondary | ICD-10-CM | POA: Diagnosis not present

## 2022-04-19 DIAGNOSIS — R635 Abnormal weight gain: Secondary | ICD-10-CM | POA: Diagnosis not present

## 2022-04-19 DIAGNOSIS — R7303 Prediabetes: Secondary | ICD-10-CM | POA: Diagnosis not present

## 2022-04-19 NOTE — Telephone Encounter (Signed)
Please advise 

## 2022-04-19 NOTE — Telephone Encounter (Signed)
noted 

## 2022-04-25 ENCOUNTER — Other Ambulatory Visit: Payer: Self-pay | Admitting: Internal Medicine

## 2022-05-17 ENCOUNTER — Ambulatory Visit: Payer: Federal, State, Local not specified - PPO | Admitting: Orthopaedic Surgery

## 2022-05-28 DIAGNOSIS — R7303 Prediabetes: Secondary | ICD-10-CM | POA: Diagnosis not present

## 2022-05-28 DIAGNOSIS — R635 Abnormal weight gain: Secondary | ICD-10-CM | POA: Diagnosis not present

## 2022-05-28 DIAGNOSIS — Z6841 Body Mass Index (BMI) 40.0 and over, adult: Secondary | ICD-10-CM | POA: Diagnosis not present

## 2022-06-21 DIAGNOSIS — R635 Abnormal weight gain: Secondary | ICD-10-CM | POA: Diagnosis not present

## 2022-06-21 DIAGNOSIS — R7303 Prediabetes: Secondary | ICD-10-CM | POA: Diagnosis not present

## 2022-06-21 DIAGNOSIS — Z6841 Body Mass Index (BMI) 40.0 and over, adult: Secondary | ICD-10-CM | POA: Diagnosis not present

## 2022-08-17 ENCOUNTER — Ambulatory Visit: Payer: Federal, State, Local not specified - PPO | Admitting: Orthopaedic Surgery

## 2022-08-17 ENCOUNTER — Encounter: Payer: Self-pay | Admitting: Orthopaedic Surgery

## 2022-08-17 VITALS — BP 177/101 | Ht 65.5 in | Wt 308.0 lb

## 2022-08-17 DIAGNOSIS — M1712 Unilateral primary osteoarthritis, left knee: Secondary | ICD-10-CM

## 2022-08-17 MED ORDER — LIDOCAINE HCL 1 % IJ SOLN
0.5000 mL | INTRAMUSCULAR | Status: AC | PRN
  Administered 2022-08-17: .5 mL

## 2022-08-17 MED ORDER — METHYLPREDNISOLONE ACETATE 40 MG/ML IJ SUSP
40.0000 mg | INTRAMUSCULAR | Status: AC | PRN
  Administered 2022-08-17: 40 mg via INTRA_ARTICULAR

## 2022-08-17 MED ORDER — BUPIVACAINE HCL 0.5 % IJ SOLN
3.0000 mL | INTRAMUSCULAR | Status: AC | PRN
  Administered 2022-08-17: 3 mL via INTRA_ARTICULAR

## 2022-08-17 NOTE — Progress Notes (Signed)
Office Visit Note   Patient: Katherine Browning           Date of Birth: 07-26-1966           MRN: [id number] Visit Date: 08/17/2022              Requested by: Hoyt Koch, MD [address] [place],  [place] [address] PCP: Hoyt Koch, MD   Assessment & Plan: Visit Diagnoses:  1. Unilateral primary osteoarthritis, left knee     Plan: Left knee injection performed.  She will continue to work on weight loss.  Handicap placard 6 months renewed.  She is continue to try to work on reaching her goal weight which is 243 current weight is 308 today.  Follow-Up Instructions: No follow-ups on file.   Orders:  Orders Placed This Encounter  Procedures   Large Joint Inj   No orders of the defined types were placed in this encounter.     Procedures: Large Joint Inj: L knee on 08/17/2022 2:59 PM Indications: joint swelling and pain Details: 22 G 1.5 in needle, anterolateral approach  Arthrogram: No  Medications: 0.5 mL lidocaine 1 %; 3 mL bupivacaine 0.5 %; 40 mg methylPREDNISolone acetate 40 MG/ML Outcome: tolerated well, no immediate complications Procedure, treatment alternatives, risks and benefits explained, specific risks discussed. Consent was given by the patient. Immediately prior to procedure a time out was called to verify the correct patient, procedure, equipment, support staff and site/side marked as required. Patient was prepped and draped in the usual sterile fashion.       Clinical Data: No additional findings.   Subjective: No chief complaint on file.   HPI 56 year old female returns with left knee osteoarthritis.  She has morbid obesity BMI 50 trying to lose weight.  She was on Wegovy but stopped due to side effects and cost.  She is requesting repeat injection in her left knee.  Review of Systems updated unchanged.   Objective: Vital Signs: BP (!) 177/101   Ht 5' 5.5" (1.664 m)   Wt (!) 308 lb (139.7 kg)   LMP 02/19/2018 Comment: neg  preg test  BMI 50.47 kg/m   Physical Exam Constitutional:      Appearance: She is well-developed.  HENT:     Head: Normocephalic.     Right Ear: External ear normal.     Left Ear: External ear normal. There is no impacted cerumen.  Eyes:     Pupils: Pupils are equal, round, and reactive to light.  Neck:     Thyroid: No thyromegaly.     Trachea: No tracheal deviation.  Cardiovascular:     Rate and Rhythm: Normal rate.  Pulmonary:     Effort: Pulmonary effort is normal.  Abdominal:     Palpations: Abdomen is soft.  Musculoskeletal:     Cervical back: No rigidity.  Skin:    General: Skin is warm and dry.  Neurological:     Mental Status: She is alert and oriented to person, place, and time.  Psychiatric:        Behavior: Behavior normal.     Ortho Exam left knee crepitus and tenderness.  Negative logroll hips.  Specialty Comments:  No specialty comments available.  Imaging: No results found.   PMFS History: Patient Active Problem List   Diagnosis Date Noted   Unilateral primary osteoarthritis, left knee 04/13/2022   Left foot pain 01/21/2022   Adjustment disorder 11/09/2021   Weight gain 04/30/2020   Low  back pain 10/06/2017   Acute rheumatoid arthritis ([place]) 02/11/2016   Arthritis 05/01/2015   Morbid obesity ([place]) 10/27/2014   Essential hypertension 10/27/2014   Knee pain, left 10/27/2014   Routine general medical examination at a health care facility 10/27/2014   Past Medical History:  Diagnosis Date   Back pain    Hypertension    Knee pain     Family History  Problem Relation Age of Onset   Hypertension Other     Past Surgical History:  Procedure Laterality Date   ECTOPIC PREGNANCY SURGERY     Social History   Occupational History   Not on file  Tobacco Use   Smoking status: Never   Smokeless tobacco: Never  Substance and Sexual Activity   Alcohol use: No    Comment: occ   Drug use: No   Sexual activity: Not on file

## 2022-12-02 ENCOUNTER — Ambulatory Visit: Payer: Federal, State, Local not specified - PPO | Admitting: Internal Medicine

## 2022-12-06 DIAGNOSIS — R635 Abnormal weight gain: Secondary | ICD-10-CM | POA: Diagnosis not present

## 2022-12-06 DIAGNOSIS — Z6841 Body Mass Index (BMI) 40.0 and over, adult: Secondary | ICD-10-CM | POA: Diagnosis not present

## 2022-12-06 DIAGNOSIS — R030 Elevated blood-pressure reading, without diagnosis of hypertension: Secondary | ICD-10-CM | POA: Diagnosis not present

## 2022-12-06 DIAGNOSIS — Z1231 Encounter for screening mammogram for malignant neoplasm of breast: Secondary | ICD-10-CM | POA: Diagnosis not present

## 2023-03-24 ENCOUNTER — Telehealth: Payer: Self-pay | Admitting: Orthopaedic Surgery

## 2023-03-24 NOTE — Telephone Encounter (Signed)
Patient asking for a renewal of her Handicap placard please advise

## 2023-03-24 NOTE — Telephone Encounter (Signed)
Please advise.  Ok for placard? For how long?

## 2023-03-28 ENCOUNTER — Telehealth: Payer: Self-pay | Admitting: Orthopaedic Surgery

## 2023-03-28 NOTE — Telephone Encounter (Signed)
Placard at front for pick up. Patient came by yesterday and was going to return at 2pm today to pick up.

## 2023-03-28 NOTE — Telephone Encounter (Signed)
Please call when handicap placard form is ready..[phone]

## 2023-03-28 NOTE — Telephone Encounter (Signed)
Placard ready and at  front for pick up. I called patient and advised.

## 2023-05-04 ENCOUNTER — Other Ambulatory Visit: Payer: Self-pay | Admitting: Internal Medicine

## 2023-07-31 ENCOUNTER — Ambulatory Visit: Payer: Federal, State, Local not specified - PPO | Admitting: Internal Medicine

## 2023-09-25 DIAGNOSIS — R0683 Snoring: Secondary | ICD-10-CM | POA: Diagnosis not present

## 2023-10-03 ENCOUNTER — Encounter: Payer: Self-pay | Admitting: Internal Medicine

## 2023-10-03 ENCOUNTER — Ambulatory Visit: Payer: Federal, State, Local not specified - PPO | Admitting: Internal Medicine

## 2023-10-03 VITALS — BP 146/100 | HR 79 | Temp 98.3°F | Ht 65.5 in | Wt 356.0 lb

## 2023-10-03 DIAGNOSIS — K219 Gastro-esophageal reflux disease without esophagitis: Secondary | ICD-10-CM

## 2023-10-03 DIAGNOSIS — M1990 Unspecified osteoarthritis, unspecified site: Secondary | ICD-10-CM

## 2023-10-03 DIAGNOSIS — M109 Gout, unspecified: Secondary | ICD-10-CM | POA: Diagnosis not present

## 2023-10-03 DIAGNOSIS — I10 Essential (primary) hypertension: Secondary | ICD-10-CM | POA: Diagnosis not present

## 2023-10-03 DIAGNOSIS — R7303 Prediabetes: Secondary | ICD-10-CM | POA: Diagnosis not present

## 2023-10-03 MED ORDER — CYCLOBENZAPRINE HCL 10 MG PO TABS: 10.0000 mg | ORAL_TABLET | Freq: Two times a day (BID) | ORAL | 3 refills | Status: AC

## 2023-10-03 MED ORDER — CLONIDINE HCL 0.1 MG PO TABS: 0.1000 mg | ORAL_TABLET | Freq: Two times a day (BID) | ORAL | 1 refills | Status: DC

## 2023-10-03 MED ORDER — COLCHICINE 0.6 MG PO TABS: 0.6000 mg | ORAL_TABLET | Freq: Every day | ORAL | 1 refills | Status: DC | PRN

## 2023-10-03 MED ORDER — ALLOPURINOL 300 MG PO TABS: 300.0000 mg | ORAL_TABLET | Freq: Every day | ORAL | 1 refills | Status: DC

## 2023-10-03 MED ORDER — PANTOPRAZOLE SODIUM 40 MG PO TBEC: 40.0000 mg | DELAYED_RELEASE_TABLET | Freq: Every day | ORAL | 3 refills | Status: DC

## 2023-10-03 MED ORDER — METFORMIN HCL ER 500 MG PO TB24: 500.0000 mg | ORAL_TABLET | Freq: Every day | ORAL | 1 refills | Status: DC

## 2023-10-03 NOTE — Patient Instructions (Addendum)
We have sent in clonidine 0.1 mg twice a day.   We have sent in metformin to take 1 pill daily.   We are checking the labs today.

## 2023-10-03 NOTE — Progress Notes (Unsigned)
   Subjective:   Patient ID: Katherine Browning, female    DOB: 04/13/1966, 57 y.o.   MRN: [id number]  HPI The patient is a 57 YO female coming in for follow up and concerns about weight. She was last seen about 18 months ago and given refills for 6 months on BP meds. She states still taking meds and about out with 1 week or so left. She is taking clonidine 0.1 mg daily. She has gained about 20-30 pounds since we last saw her.   Review of Systems  Constitutional: Negative.   HENT: Negative.    Eyes: Negative.   Respiratory:  Negative for cough, chest tightness and shortness of breath.   Cardiovascular:  Negative for chest pain, palpitations and leg swelling.  Gastrointestinal:  Negative for abdominal distention, abdominal pain, constipation, diarrhea, nausea and vomiting.  Musculoskeletal:  Positive for arthralgias and back pain.  Skin: Negative.   Neurological: Negative.   Psychiatric/Behavioral: Negative.      Objective:  Physical Exam Constitutional:      Appearance: She is well-developed. She is obese.  HENT:     Head: Normocephalic and atraumatic.  Cardiovascular:     Rate and Rhythm: Normal rate and regular rhythm.  Pulmonary:     Effort: Pulmonary effort is normal. No respiratory distress.     Breath sounds: Normal breath sounds. No wheezing or rales.  Abdominal:     General: Bowel sounds are normal. There is no distension.     Palpations: Abdomen is soft.     Tenderness: There is no abdominal tenderness. There is no rebound.  Musculoskeletal:        General: Tenderness present.     Cervical back: Normal range of motion.  Skin:    General: Skin is warm and dry.  Neurological:     Mental Status: She is alert and oriented to person, place, and time.     Coordination: Coordination normal.     Vitals:   10/03/23 1557  BP: (!) 146/100  Pulse: 79  Temp: 98.3 F (36.8 C)  TempSrc: Oral  SpO2: 98%  Weight: (!) 356 lb (161.5 kg)  Height: 5' 5.5" (1.664 m)     Assessment & Plan:

## 2023-10-04 LAB — COMPREHENSIVE METABOLIC PANEL
ALT: 15 U/L (ref 0–35)
AST: 14 U/L (ref 0–37)
Albumin: 4.3 g/dL (ref 3.5–5.2)
Alkaline Phosphatase: 85 U/L (ref 39–117)
BUN: 17 mg/dL (ref 6–23)
CO2: 28 meq/L (ref 19–32)
Calcium: 9.8 mg/dL (ref 8.4–10.5)
Chloride: 106 meq/L (ref 96–112)
Creatinine, Ser: 1.14 mg/dL (ref 0.40–1.20)
GFR: 54.1 mL/min — ABNORMAL LOW (ref >60.00)
Glucose, Bld: 101 mg/dL — ABNORMAL HIGH (ref 70–99)
Potassium: 3.9 meq/L (ref 3.5–5.1)
Sodium: 142 meq/L (ref 135–145)
Total Bilirubin: 0.4 mg/dL (ref 0.2–1.2)
Total Protein: 8.1 g/dL (ref 6.0–8.3)

## 2023-10-04 LAB — CBC
HCT: 40.7 % (ref 36.0–46.0)
Hemoglobin: 13.3 g/dL (ref 12.0–15.0)
MCHC: 32.5 g/dL (ref 30.0–36.0)
MCV: 91.1 fL (ref 78.0–100.0)
Platelets: 246 10*3/uL (ref 150.0–400.0)
RBC: 4.47 Mil/uL (ref 3.87–5.11)
RDW: 15.4 % (ref 11.5–15.5)
WBC: 6.2 10*3/uL (ref 4.0–10.5)

## 2023-10-04 LAB — LIPID PANEL
Cholesterol: 199 mg/dL (ref 0–200)
HDL: 50.9 mg/dL (ref >39.00)
LDL Cholesterol: 122 mg/dL — ABNORMAL HIGH (ref 0–99)
NonHDL: 147.84
Total CHOL/HDL Ratio: 4
Triglycerides: 129 mg/dL (ref 0.0–149.0)
VLDL: 25.8 mg/dL (ref 0.0–40.0)

## 2023-10-04 LAB — URIC ACID: Uric Acid, Serum: 8.2 mg/dL — ABNORMAL HIGH (ref 2.4–7.0)

## 2023-10-04 LAB — HEMOGLOBIN A1C: Hgb A1c MFr Bld: 6 % (ref 4.6–6.5)

## 2023-10-05 ENCOUNTER — Telehealth: Payer: Self-pay

## 2023-10-05 ENCOUNTER — Encounter: Payer: Self-pay | Admitting: Internal Medicine

## 2023-10-05 DIAGNOSIS — R7303 Prediabetes: Secondary | ICD-10-CM | POA: Insufficient documentation

## 2023-10-05 DIAGNOSIS — K219 Gastro-esophageal reflux disease without esophagitis: Secondary | ICD-10-CM | POA: Insufficient documentation

## 2023-10-05 NOTE — Assessment & Plan Note (Signed)
Refilled pantoprazole 40 mg daily.

## 2023-10-05 NOTE — Assessment & Plan Note (Signed)
BP severely high today and I suspect she has been taking medication inconsistently. We had a long discussion about how clonidine causes withdrawal hypertension and may not be a good option. She had lip swelling with lisinopril in the past so I would not prescribe ACE-I or ARB. Offered hydrochlorothiazide as a daily option or change clonidine dosing to 0.1 mg BID and see her back in 2-3 weeks. She elects to do that. We prescribed and will see her back shortly.

## 2023-10-05 NOTE — Assessment & Plan Note (Signed)
She states taking allopurinol but refill pattern not consistent. Checking uric acid and refill allopurinol 300 mg daily and colchicine for as needed. No current flare.

## 2023-10-05 NOTE — Assessment & Plan Note (Signed)
Checking lipid panel and HgA1c. We will start metformin for weight loss. Prior sugars were in pre-diabetes range and with weight gain suspect they will be at least that now.

## 2023-10-05 NOTE — Assessment & Plan Note (Signed)
Last HgA1c 2022 due to no labs since. She has gained significant amount of weight since that time and is urinating often lately. Suspect progression. Checking HgA1c and we will start metformin for weight loss and treatment of metabolic dysfunction.

## 2023-10-05 NOTE — Assessment & Plan Note (Signed)
Refilled flexeril and unable to prescribe ibuprofen without current labs given uncontrolled hypertension.

## 2023-10-06 NOTE — Telephone Encounter (Signed)
Relayed back message to the patient and I believe there was some frustration patient verbalized she understood but hung up immediately after

## 2023-10-06 NOTE — Telephone Encounter (Signed)
As we talked about at visit ozempic is a medicine only for diabetes. She does not have diabetes so cannot take this medicine.

## 2023-11-06 DIAGNOSIS — G4733 Obstructive sleep apnea (adult) (pediatric): Secondary | ICD-10-CM | POA: Diagnosis not present

## 2024-01-08 ENCOUNTER — Telehealth: Payer: Self-pay | Admitting: Internal Medicine

## 2024-01-08 NOTE — Telephone Encounter (Signed)
Copied from CRM [id number]. Topic: Clinical - Medication Question >> Jan 08, 2024 12:39 PM Sim Boast F wrote: Reason for CRM: patient called to request Zepbound for weight loss/ sleep apnea. Please cal back

## 2024-03-16 IMAGING — MR MR LUMBAR SPINE W/O CM
4 of 6 series · 27 of 48 positions shown · non-contrast
Comparison: MR lumbar 03/19/2014; X-ray lumbar 11/08/2021.

CLINICAL DATA: Left lower extremity radiculopathy and neurogenic
claudication. Chronic lower back pain radiating into left and right
side of back and hips, left greater than right for years.

EXAM:
MRI LUMBAR SPINE WITHOUT CONTRAST
TECHNIQUE: Multiplanar, multisequence MR imaging of the lumbar spine was
performed. No intravenous contrast was administered.

[Series 3: T2 · sagittal · 4.0mm · 0.53mm/px · 6 of 16 slices shown (1 of 2)]
[im 1/16]
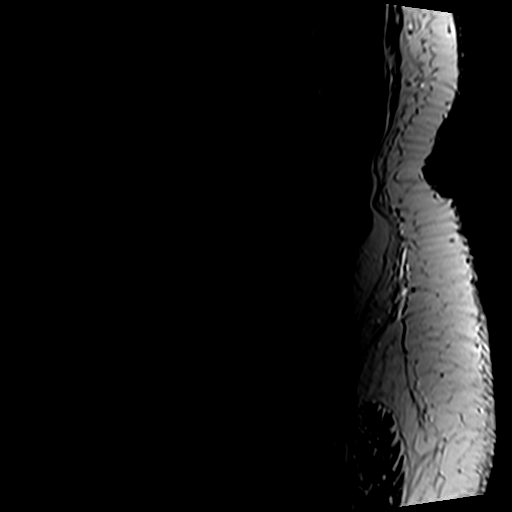
[im 4/16]
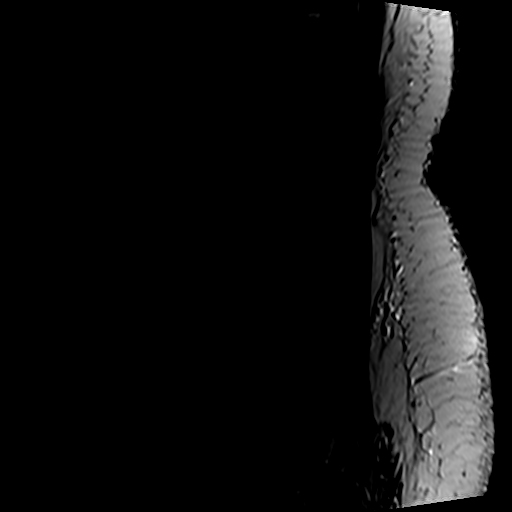
[im 7/16]
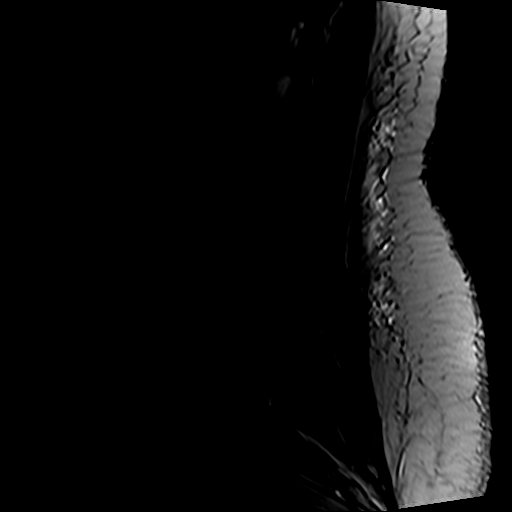
[im 10/16]
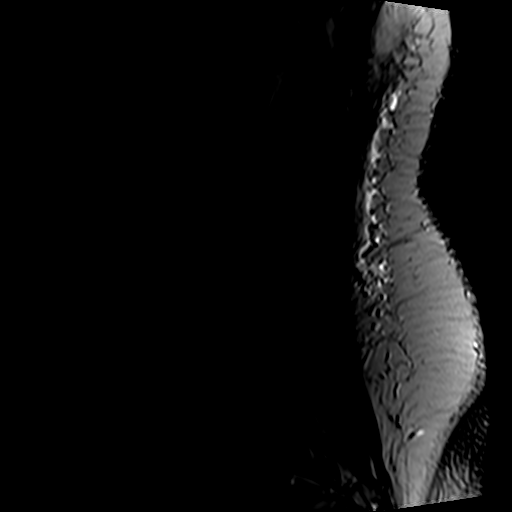
[im 13/16]
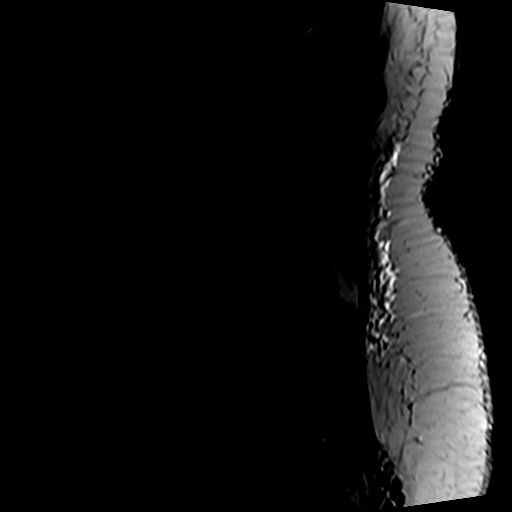
[im 16/16]
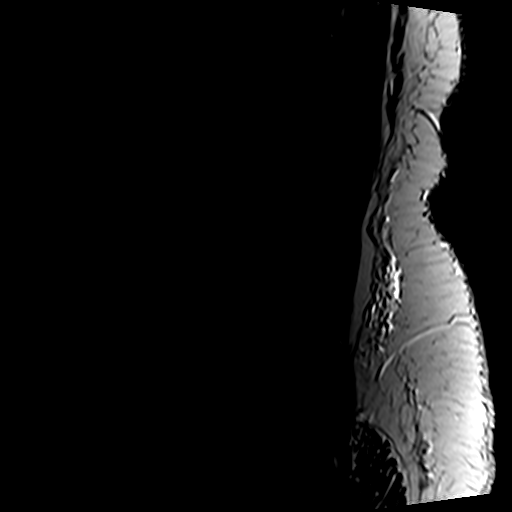

[Series 5: T1 · sagittal · 4.0mm · 0.53mm/px · 4 of 15 slices shown (1 of 2)]
[im 1/15]
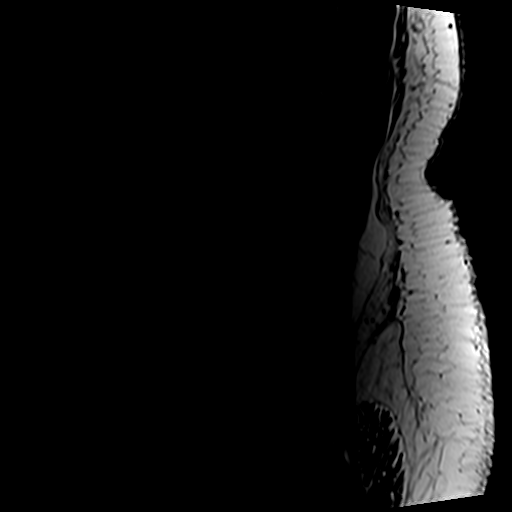
[im 5/15]
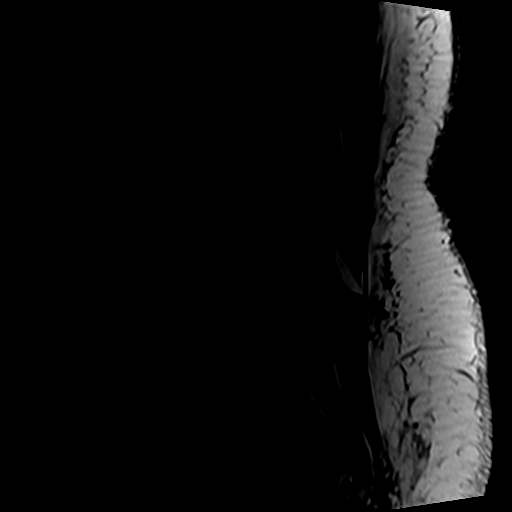
[im 10/15]
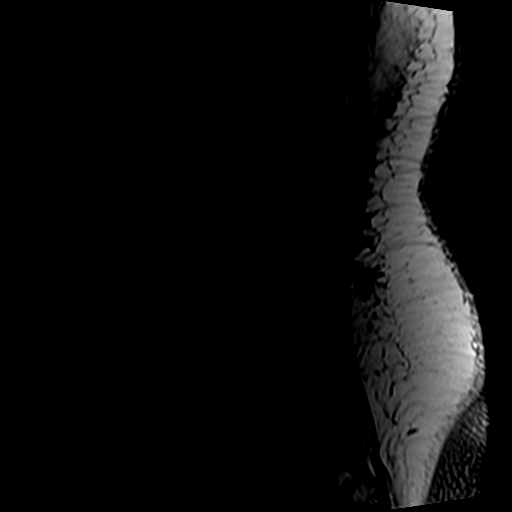
[im 15/15]
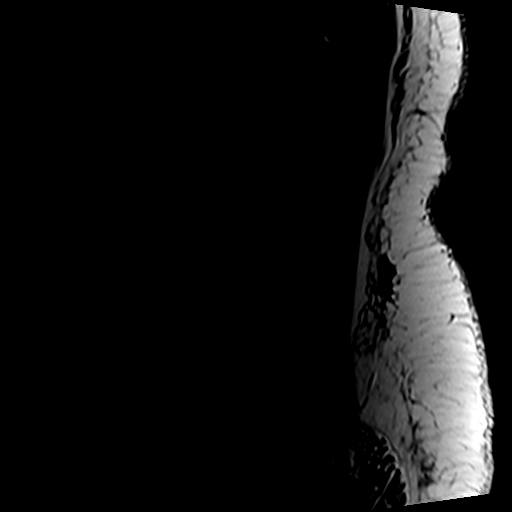

[Series 6: T2 · axial · 4.0mm · 0.70mm/px · z∈[-159,+70]mm · 11 of 38 slices shown (2 of 2)]
[im 1/38]
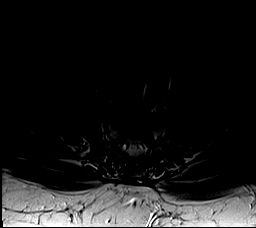
[im 4/38]
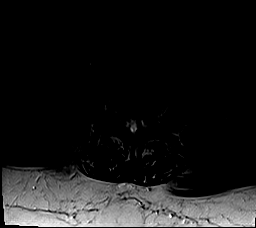
[im 8/38]
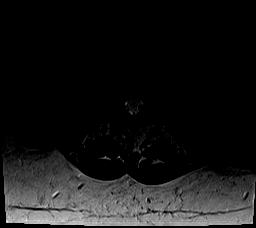
[im 12/38]
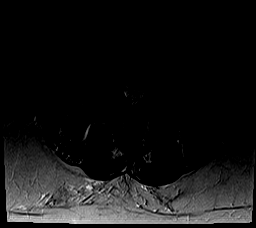
[im 15/38]
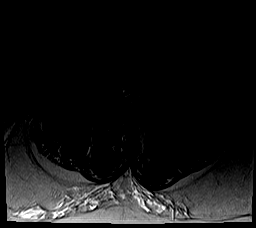
[im 19/38]
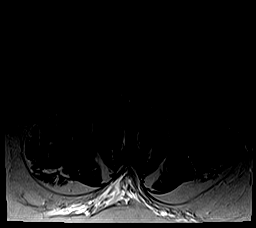
[im 23/38]
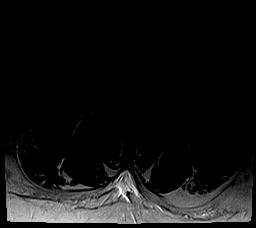
[im 26/38]
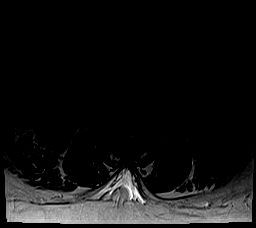
[im 30/38]
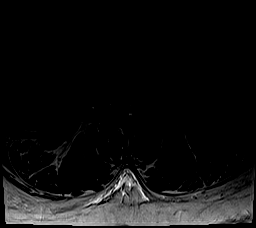
[im 34/38]
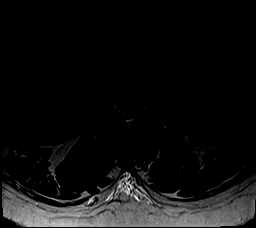
[im 38/38]
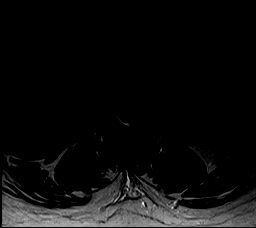

[Series 7: T1 · axial · 4.0mm · 0.35mm/px · z∈[-159,+50]mm · 6 of 38 slices shown (2 of 2)]
[im 1/38]
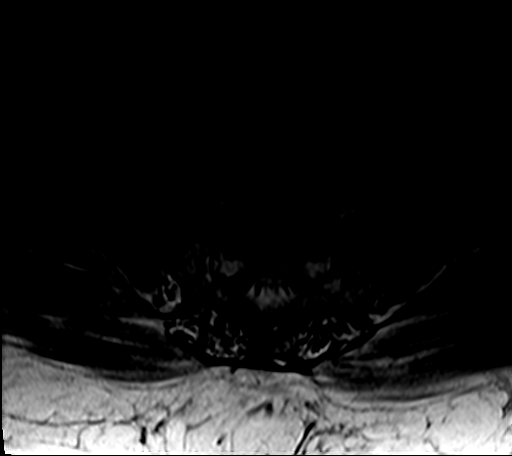
[im 4/38]
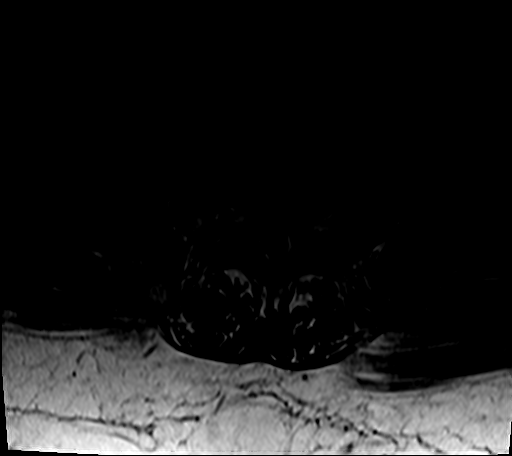
[im 8/38]
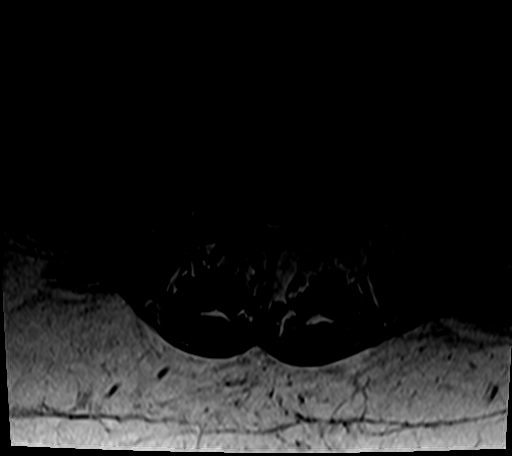
[im 12/38]
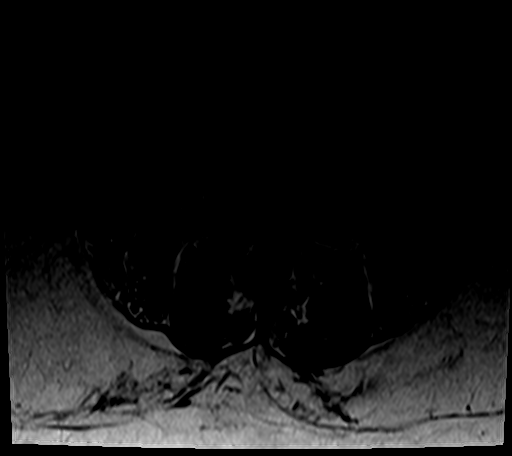
[im 19/38]
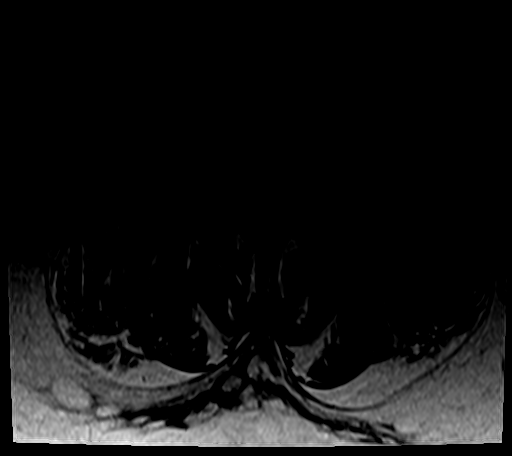
[im 34/38]
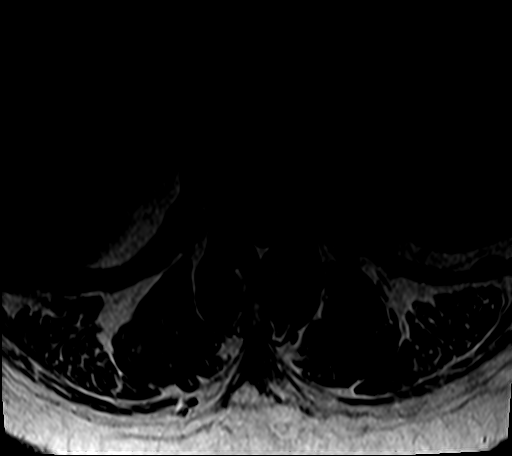

[27 of 48 positions shown; findings below may reference images not displayed]

FINDINGS: Segmentation:  Standard.

Alignment:  Mild levocurvature.  No listhesis.

Vertebrae:  No acute fracture or suspicious osseous lesion.

Conus medullaris and cauda equina: Conus extends to the L2 level.
Conus and cauda equina appear normal.

Paraspinal and other soft tissues: Negative.

Disc levels:

T12-L1: No significant disc bulge. No spinal canal stenosis or
neural foraminal narrowing.

L1-L2: No significant disc bulge. No spinal canal stenosis or neural
foraminal narrowing.

L2-L3: No significant disc bulge. Mild facet arthropathy. No spinal
canal stenosis or neural foraminal narrowing.

L3-L4: No significant disc bulge. Mild facet arthropathy. No spinal
canal stenosis or neural foraminal narrowing.

L4-L5: Mild disc bulge with right subarticular and foraminal
protrusion and right subarticular annular fissure. Narrowing of the
right lateral recess. Mild facet arthropathy. No spinal canal
stenosis or neural foraminal narrowing.

L5-S1: Increased disc height loss compared to the prior exam.
Moderate left and mild right facet arthropathy. Increased left
paracentral and subarticular disc protrusion. Narrowing of the left
lateral recess. No spinal canal stenosis or neural foraminal
narrowing.
IMPRESSION: 1. L5-S1 narrowing of the left lateral recess, which could affect
the descending left S1 nerve roots.
2. L4-L5 narrowing of the right lateral recess, which could affect
the descending right L5 nerve roots.
3. Multilevel facet arthropathy, which is worst on the left at
L5-S1, where it is moderate, which can be a cause of back pain.

## 2024-04-13 ENCOUNTER — Emergency Department (HOSPITAL_COMMUNITY)
Admission: EM | Admit: 2024-04-13 | Discharge: 2024-04-13 | Disposition: A | Discharge status: Home / Self Care | Attending: Emergency Medicine | Admitting: Emergency Medicine

## 2024-04-13 ENCOUNTER — Emergency Department (HOSPITAL_COMMUNITY)

## 2024-04-13 ENCOUNTER — Other Ambulatory Visit: Payer: Self-pay

## 2024-04-13 DIAGNOSIS — R030 Elevated blood-pressure reading, without diagnosis of hypertension: Secondary | ICD-10-CM | POA: Insufficient documentation

## 2024-04-13 DIAGNOSIS — M7731 Calcaneal spur, right foot: Secondary | ICD-10-CM | POA: Diagnosis not present

## 2024-04-13 DIAGNOSIS — I10 Essential (primary) hypertension: Secondary | ICD-10-CM | POA: Diagnosis not present

## 2024-04-13 DIAGNOSIS — M79671 Pain in right foot: Secondary | ICD-10-CM | POA: Diagnosis not present

## 2024-04-13 DIAGNOSIS — M2141 Flat foot [pes planus] (acquired), right foot: Secondary | ICD-10-CM | POA: Diagnosis not present

## 2024-04-13 DIAGNOSIS — M7989 Other specified soft tissue disorders: Secondary | ICD-10-CM | POA: Diagnosis not present

## 2024-04-13 DIAGNOSIS — M25571 Pain in right ankle and joints of right foot: Secondary | ICD-10-CM | POA: Insufficient documentation

## 2024-04-13 DIAGNOSIS — Z79899 Other long term (current) drug therapy: Secondary | ICD-10-CM | POA: Insufficient documentation

## 2024-04-13 MED ORDER — AMLODIPINE BESYLATE 5 MG PO TABS
5.0000 mg | ORAL_TABLET | Freq: Once | ORAL | Status: AC
  Administered 2024-04-13: 5 mg via ORAL
  Filled 2024-04-13: qty 1

## 2024-04-13 MED ORDER — LIDOCAINE 5 % EX PTCH
1.0000 | MEDICATED_PATCH | CUTANEOUS | Status: DC
  Administered 2024-04-13: 1 via TRANSDERMAL
  Filled 2024-04-13: qty 1

## 2024-04-13 MED ORDER — OXYCODONE-ACETAMINOPHEN 5-325 MG PO TABS: 1.0000 | ORAL_TABLET | Freq: Four times a day (QID) | ORAL | 0 refills | Status: DC | PRN

## 2024-04-13 MED ORDER — IBUPROFEN 200 MG PO TABS
600.0000 mg | ORAL_TABLET | Freq: Once | ORAL | Status: AC
  Administered 2024-04-13: 600 mg via ORAL
  Filled 2024-04-13: qty 3

## 2024-04-13 MED ORDER — AMLODIPINE BESYLATE 5 MG PO TABS: 5.0000 mg | ORAL_TABLET | Freq: Every day | ORAL | 2 refills | Status: DC

## 2024-04-13 MED ORDER — PREDNISONE 20 MG PO TABS: 40.0000 mg | ORAL_TABLET | Freq: Every day | ORAL | 0 refills | Status: AC

## 2024-04-13 MED ORDER — OXYCODONE-ACETAMINOPHEN 5-325 MG PO TABS
1.0000 | ORAL_TABLET | Freq: Once | ORAL | Status: AC
  Administered 2024-04-13: 1 via ORAL
  Filled 2024-04-13: qty 1

## 2024-04-13 NOTE — ED Provider Notes (Signed)
 [place] EMERGENCY DEPARTMENT AT [facility] Provider Note   CSN: [id number] Arrival date & time: 04/13/24  1807     History Chief Complaint  Patient presents with   Foot Pain   Ankle Pain    Katherine Browning is a 58 y.o. female with history of hypertension, right arthritis, gout presents emerged from today for evaluation of right foot pain since around Wednesday or Thursday.  Denies any injury or new shoes.  She reports that she does have history of gout but usually gets it in her knee or foot.  She did pick up the colchicine  today however did not take it.  She reports that she is prescribed allopurinol  however does not take this daily.  She denies recent fevers or chills.  She has not tried any other medication or pain medication at home for her symptoms.  Additionally, she mentions that she is only on clonidine  for blood pressure management.  She reports that she was was to be on lisinopril  however she did not tolerate that well and stopped taking it.  She has not been placed on any other blood pressure medication.  She denies any chest pain or shortness of breath.  She reports this feels similar to her As she cannot even tolerate a sheet to be on the area like she has had with her previous gout episodes.  Denies any tobacco, EtOH, drug use ever.   Foot Pain Pertinent negatives include no chest pain and no shortness of breath.  Ankle Pain Associated symptoms: no fever        Home Medications Prior to Admission medications   Medication Sig Start Date End Date Taking? Authorizing Provider  allopurinol  (ZYLOPRIM ) 300 MG tablet Take 1 tablet (300 mg total) by mouth daily. 10/03/23   Adelia Homestead, MD  cloNIDine  (CATAPRES ) 0.1 MG tablet Take 1 tablet (0.1 mg total) by mouth 2 (two) times daily. 10/03/23   Adelia Homestead, MD  colchicine  0.6 MG tablet Take 1 tablet (0.6 mg total) by mouth daily as needed. 10/03/23   Adelia Homestead, MD  cyclobenzaprine   (FLEXERIL ) 10 MG tablet Take 1 tablet (10 mg total) by mouth 2 (two) times daily. 10/03/23   Adelia Homestead, MD  ibuprofen  (ADVIL ) 800 MG tablet TAKE 1 TABLET(800 MG) BY MOUTH EVERY 8 HOURS AS NEEDED 02/11/22   Adelia Homestead, MD  metFORMIN  (GLUCOPHAGE -XR) 500 MG 24 hr tablet Take 1 tablet (500 mg total) by mouth daily with breakfast. 10/03/23   Adelia Homestead, MD  pantoprazole  (PROTONIX ) 40 MG tablet Take 1 tablet (40 mg total) by mouth daily. 10/03/23   Adelia Homestead, MD      Allergies    Ivp dye [iodinated contrast media], Biaxin [clarithromycin], Iodine, Lisinopril , and Vicodin [hydrocodone -acetaminophen ]    Review of Systems   Review of Systems  Constitutional:  Negative for chills and fever.  Respiratory:  Negative for shortness of breath.   Cardiovascular:  Negative for chest pain.  Musculoskeletal:  Positive for arthralgias.    Physical Exam Updated Vital Signs BP (!) 194/121   Pulse 72   Temp 98.8 F (37.1 C) (Oral)   Resp 18   LMP 02/19/2018 Comment: neg preg test  SpO2 97%  Physical Exam Vitals and nursing note reviewed.  Constitutional:      General: She is not in acute distress.    Appearance: She is obese. She is not toxic-appearing.     Comments: On phone in no acute  distress  Eyes:     General: No scleral icterus. Cardiovascular:     Rate and Rhythm: Normal rate.  Pulmonary:     Effort: Pulmonary effort is normal. No respiratory distress.  Musculoskeletal:     Comments: Some tenderness and minor swelling noted to the inferior portion of the lateral right malleoli.  No fluctuance or induration.  Some mild increase in warmth.  She has palpable DP and PT pulses bilaterally.  Compartments are soft.  Sensation reportedly intact and symmetric.  She has tenderness to very light touch to the area.  Skin:    General: Skin is warm and dry.  Neurological:     Mental Status: She is alert.     ED Results / Procedures / Treatments    Labs (all labs ordered are listed, but only abnormal results are displayed) Labs Reviewed - No data to display  EKG None  Radiology DG Ankle Complete Right Result Date: 04/13/2024 CLINICAL DATA:  Right-sided foot/heel pain and right ankle pain EXAM: RIGHT ANKLE - COMPLETE 3+ VIEW; RIGHT FOOT COMPLETE - 3+ VIEW COMPARISON:  None Available. FINDINGS: Soft tissue swelling about the ankle greatest over the lateral malleolus. No acute fracture or dislocation. Pes planus. Calcaneal spurs. IMPRESSION: Soft tissue swelling about the ankle without acute fracture. Electronically Signed   By: Rozell Cornet M.D.   On: 04/13/2024 19:09   DG Foot Complete Right Result Date: 04/13/2024 CLINICAL DATA:  Right-sided foot/heel pain and right ankle pain EXAM: RIGHT ANKLE - COMPLETE 3+ VIEW; RIGHT FOOT COMPLETE - 3+ VIEW COMPARISON:  None Available. FINDINGS: Soft tissue swelling about the ankle greatest over the lateral malleolus. No acute fracture or dislocation. Pes planus. Calcaneal spurs. IMPRESSION: Soft tissue swelling about the ankle without acute fracture. Electronically Signed   By: Rozell Cornet M.D.   On: 04/13/2024 19:09    Procedures Procedures   Medications Ordered in ED Medications  lidocaine  (LIDODERM ) 5 % 1 patch (has no administration in time range)  oxyCODONE -acetaminophen  (PERCOCET/ROXICET) 5-325 MG per tablet 1 tablet (1 tablet Oral Given 04/13/24 1940)  amLODipine  (NORVASC ) tablet 5 mg (5 mg Oral Given 04/13/24 1941)    ED Course/ Medical Decision Making/ A&P                               Medical Decision Making Amount and/or Complexity of Data Reviewed Radiology: ordered.  Risk OTC drugs. Prescription drug management.   58 y.o. female presents to the ER for evaluation of right foot pain. Differential diagnosis includes but is not limited to arthritis, septic arthritis, gout, cellulitis, tendinitis. Vital signs elevated blood pressure otherwise unremarkable. Physical exam  as noted above.   Patient does have history of elevated blood pressure however has not been on medication for almost a year.  Her last visit her blood pressure was 177/100, not far from baseline.  She is getting ride home from family member.  Will give her dose of new blood pressure medicine as well as Percocet.  X-ray imaging of the foot and ankle shows Soft tissue swelling about the ankle without acute fracture. Calcaneal spurs. Per radiologist's interpretation.    I have discussed the importance of adhering to blood pressure medication with the patient.  He states that she can still take her clonidine  however that is usually not monotherapy for managing blood pressure.  Will have her start off with amlodipine .  She reports that she does not like  her primary care provider, I listed the ration for anyone in the degenerative work for her to follow-up with.  I will prescribe her some prednisone  to help with the gout as well as some Percocet to help with the pain.  Of also prescribed her amlodipine  as well for her uncontrolled blood pressure.  She does not have any chest pain or shortness of breath.  She is otherwise asymptomatic for any concern for hypertensive urgency or emergency.  I given her blood pressure log and encouraged her to start taking her blood pressure medication and measurements to bring them to her next primary care appointment.  For ankle, given that she reports that this feels like her previous gout as well as it has a gout-like appearance I do agree with her.  She is active flexion-extension of the ankle, I think this is more likely gout than a septic arthritis given lack of fevers or other risk factors.  She is stable for discharge home with close outpatient follow-up and strict return precautions.  We discussed the results of the labs/imaging. The plan is to medications prescribed, follow-up as needed. We discussed strict return precautions and red flag symptoms. The patient verbalized  their understanding and agrees to the plan. The patient is stable and being discharged home in good condition.  Portions of this report may have been transcribed using voice recognition software. Every effort was made to ensure accuracy; however, inadvertent computerized transcription errors may be present.   Final Clinical Impression(s) / ED Diagnoses Final diagnoses:  Acute right ankle pain  Elevated blood pressure reading    Rx / DC Orders ED Discharge Orders          Ordered    predniSONE  (DELTASONE ) 20 MG tablet  Daily        04/13/24 2124    oxyCODONE -acetaminophen  (PERCOCET/ROXICET) 5-325 MG tablet  Every 6 hours PRN        04/13/24 2124    amLODipine  (NORVASC ) 5 MG tablet  Daily        04/13/24 2124              Spence Dux, PA-C 04/16/24 1436    Deatra Face, MD 04/20/24 [phone]

## 2024-04-13 NOTE — ED Triage Notes (Signed)
 Pt has c/o right side foot/heel pain and right ankle pain- no known injuries.

## 2024-04-13 NOTE — Discharge Instructions (Addendum)
 You were seen in the emerged part today for evaluation of your ankle pain.  This is likely gout.  I giving some prednisone  to help with this.  You can also take Tylenol  as needed for pain.  I will prescribe you a medication called Percocet which is a narcotic pain medication.  Please do not drive or operate heavy machinery while on this medication as it will make you sleepy.  This medication will also make you constipated.  Please ensure that you are elevating the leg is sitting after that as this will help with the healing and inflammation and ultimately pain.  Additionally, I have given you a blood pressure medicine for you to take.  Please take this daily in addition to your clonidine .  You will need to follow-up with your primary care doctor.  I have listed a blood pressure form for you to record your blood pressures and to see if this improves.  If you have any worsening pain, swelling, fevers, numbness, tingling, color changes, temperature changes, please return to nearest for department for evaluation.  If you have any other concerns, new or worsening symptoms, please return to your nearest emerged part for reevaluation.  Contact a doctor if: You have another gout attack. You still have symptoms of a gout attack after 10 days of treatment. You have problems (side effects) because of your medicines. You have chills or a fever. You have burning pain when you pee (urinate). You have pain in your lower back or belly. Get help right away if: You have very bad pain. Your pain cannot be co

## 2024-04-17 ENCOUNTER — Ambulatory Visit (INDEPENDENT_AMBULATORY_CARE_PROVIDER_SITE_OTHER): Admitting: Emergency Medicine

## 2024-04-17 ENCOUNTER — Encounter: Payer: Self-pay | Admitting: Emergency Medicine

## 2024-04-17 VITALS — BP 133/95 | HR 76 | Temp 98.5°F | Ht 65.5 in | Wt 368.0 lb

## 2024-04-17 DIAGNOSIS — M109 Gout, unspecified: Secondary | ICD-10-CM | POA: Diagnosis not present

## 2024-04-17 DIAGNOSIS — I10 Essential (primary) hypertension: Secondary | ICD-10-CM | POA: Diagnosis not present

## 2024-04-17 MED ORDER — MELOXICAM 15 MG PO TABS: 15.0000 mg | ORAL_TABLET | Freq: Every day | ORAL | 0 refills | Status: AC

## 2024-04-17 NOTE — Assessment & Plan Note (Signed)
 Diet and nutrition discussed Interested in weight loss medications Advised to follow-up with PCP for this

## 2024-04-17 NOTE — Patient Instructions (Signed)
 Hypertension, Adult High blood pressure (hypertension) is when the force of blood pumping through the arteries is too strong. The arteries are the blood vessels that carry blood from the heart throughout the body. Hypertension forces the heart to work harder to pump blood and may cause arteries to become narrow or stiff. Untreated or uncontrolled hypertension can lead to a heart attack, heart failure, a stroke, kidney disease, and other problems. A blood pressure reading consists of a higher number over a lower number. Ideally, your blood pressure should be below 120/80. The first ("top") number is called the systolic pressure. It is a measure of the pressure in your arteries as your heart beats. The second ("bottom") number is called the diastolic pressure. It is a measure of the pressure in your arteries as the heart relaxes. What are the causes? The exact cause of this condition is not known. There are some conditions that result in high blood pressure. What increases the risk? Certain factors may make you more likely to develop high blood pressure. Some of these risk factors are under your control, including: Smoking. Not getting enough exercise or physical activity. Being overweight. Having too much fat, sugar, calories, or salt (sodium) in your diet. Drinking too much alcohol. Other risk factors include: Having a personal history of heart disease, diabetes, high cholesterol, or kidney disease. Stress. Having a family history of high blood pressure and high cholesterol. Having obstructive sleep apnea. Age. The risk increases with age. What are the signs or symptoms? High blood pressure may not cause symptoms. Very high blood pressure (hypertensive crisis) may cause: Headache. Fast or irregular heartbeats (palpitations). Shortness of breath. Nosebleed. Nausea and vomiting. Vision changes. Severe chest pain, dizziness, and seizures. How is this diagnosed? This condition is diagnosed by  measuring your blood pressure while you are seated, with your arm resting on a flat surface, your legs uncrossed, and your feet flat on the floor. The cuff of the blood pressure monitor will be placed directly against the skin of your upper arm at the level of your heart. Blood pressure should be measured at least twice using the same arm. Certain conditions can cause a difference in blood pressure between your right and left arms. If you have a high blood pressure reading during one visit or you have normal blood pressure with other risk factors, you may be asked to: Return on a different day to have your blood pressure checked again. Monitor your blood pressure at home for 1 week or longer. If you are diagnosed with hypertension, you may have other blood or imaging tests to help your health care provider understand your overall risk for other conditions. How is this treated? This condition is treated by making healthy lifestyle changes, such as eating healthy foods, exercising more, and reducing your alcohol intake. You may be referred for counseling on a healthy diet and physical activity. Your health care provider may prescribe medicine if lifestyle changes are not enough to get your blood pressure under control and if: Your systolic blood pressure is above 130. Your diastolic blood pressure is above 80. Your personal target blood pressure may vary depending on your medical conditions, your age, and other factors. Follow these instructions at home: Eating and drinking  Eat a diet that is high in fiber and potassium, and low in sodium, added sugar, and fat. An example of this eating plan is called the DASH diet. DASH stands for Dietary Approaches to Stop Hypertension. To eat this way: Eat  plenty of fresh fruits and vegetables. Try to fill one half of your plate at each meal with fruits and vegetables. Eat whole grains, such as whole-wheat pasta, brown rice, or whole-grain bread. Fill about one  fourth of your plate with whole grains. Eat or drink low-fat dairy products, such as skim milk or low-fat yogurt. Avoid fatty cuts of meat, processed or cured meats, and poultry with skin. Fill about one fourth of your plate with lean proteins, such as fish, chicken without skin, beans, eggs, or tofu. Avoid pre-made and processed foods. These tend to be higher in sodium, added sugar, and fat. Reduce your daily sodium intake. Many people with hypertension should eat less than 1,500 mg of sodium a day. Do not drink alcohol if: Your health care provider tells you not to drink. You are pregnant, may be pregnant, or are planning to become pregnant. If you drink alcohol: Limit how much you have to: 0-1 drink a day for women. 0-2 drinks a day for men. Know how much alcohol is in your drink. In the [place] one drink equals one 12 oz bottle of beer (355 mL), one 5 oz glass of wine (148 mL), or one 1 oz glass of hard liquor (44 mL). Lifestyle  Work with your health care provider to maintain a healthy body weight or to lose weight. Ask what an ideal weight is for you. Get at least 30 minutes of exercise that causes your heart to beat faster (aerobic exercise) most days of the week. Activities may include walking, swimming, or biking. Include exercise to strengthen your muscles (resistance exercise), such as Pilates or lifting weights, as part of your weekly exercise routine. Try to do these types of exercises for 30 minutes at least 3 days a week. Do not use any products that contain nicotine or tobacco. These products include cigarettes, chewing tobacco, and vaping devices, such as e-cigarettes. If you need help quitting, ask your health care provider. Monitor your blood pressure at home as told by your health care provider. Keep all follow-up visits. This is important. Medicines Take over-the-counter and prescription medicines only as told by your health care provider. Follow directions carefully. Blood  pressure medicines must be taken as prescribed. Do not skip doses of blood pressure medicine. Doing this puts you at risk for problems and can make the medicine less effective. Ask your health care provider about side effects or reactions to medicines that you should watch for. Contact a health care provider if you: Think you are having a reaction to a medicine you are taking. Have headaches that keep coming back (recurring). Feel dizzy. Have swelling in your ankles. Have trouble with your vision. Get help right away if you: Develop a severe headache or confusion. Have unusual weakness or numbness. Feel faint. Have severe pain in your chest or abdomen. Vomit repeatedly. Have trouble breathing. These symptoms may be an emergency. Get help right away. Call 911. Do not wait to see if the symptoms will go away. Do not drive yourself to the hospital. Summary Hypertension is when the force of blood pumping through your arteries is too strong. If this condition is not controlled, it may put you at risk for serious complications. Your personal target blood pressure may vary depending on your medical conditions, your age, and other factors. For most people, a normal blood pressure is less than 120/80. Hypertension is treated with lifestyle changes, medicines, or a combination of both. Lifestyle changes include losing weight, eating a healthy,  low-sodium diet, exercising more, and limiting alcohol. This information is not intended to replace advice given to you by your health care provider. Make sure you discuss any questions you have with your health care provider. Document Revised: 09/14/2021 Document Reviewed: 09/14/2021 Elsevier Patient Education  2024 [employer].

## 2024-04-17 NOTE — Progress Notes (Signed)
 Katherine Browning 58 y.o.   Chief Complaint  Patient presents with   Hospitalization Follow-up    Patient states she went to to the ED for Right Ankle pain; GOUT. She states she is able to walk on it better her pain is still there but a lot better. Also wanting to check Bp meds/ also wanting something for weight loss. Patient needing work release letter stating she is able to work full duties with no restrictions    HISTORY OF PRESENT ILLNESS: This is a 58 y.o. female here for follow-up of emergency department visit on 04/13/2024 when she presented with gout flareup Was also found to be hypertensive.  Was started on amlodipine 5 mg daily. Feeling better. Also states she is looking for a new PCP.  Not happy with present one. Emergency department visit assessment as follows:  58 y.o. female presents to the ER for evaluation of right foot pain. Differential diagnosis includes but is not limited to arthritis, septic arthritis, gout, cellulitis, tendinitis. Vital signs elevated blood pressure otherwise unremarkable. Physical exam as noted above.    Patient does have history of elevated blood pressure however has not been on medication for almost a year.  Her last visit her blood pressure was 177/100, not far from baseline.  She is getting ride home from family member.  Will give her dose of new blood pressure medicine as well as Percocet.   X-ray imaging of the foot and ankle shows Soft tissue swelling about the ankle without acute fracture. Calcaneal spurs. Per radiologist's interpretation.   HPI   Prior to Admission medications   Medication Sig Start Date End Date Taking? Authorizing Provider  allopurinol  (ZYLOPRIM ) 300 MG tablet Take 1 tablet (300 mg total) by mouth daily. 10/03/23  Yes Adelia Homestead, MD  amLODipine (NORVASC) 5 MG tablet Take 1 tablet (5 mg total) by mouth daily. 04/13/24  Yes Spence Dux, PA-C  cloNIDine  (CATAPRES ) 0.1 MG tablet Take 1 tablet (0.1 mg total) by mouth 2  (two) times daily. 10/03/23  Yes Adelia Homestead, MD  colchicine  0.6 MG tablet Take 1 tablet (0.6 mg total) by mouth daily as needed. 10/03/23  Yes Adelia Homestead, MD  cyclobenzaprine  (FLEXERIL ) 10 MG tablet Take 1 tablet (10 mg total) by mouth 2 (two) times daily. 10/03/23  Yes Adelia Homestead, MD  meloxicam (MOBIC) 15 MG tablet Take 1 tablet (15 mg total) by mouth daily for 10 days. 04/17/24 04/27/24 Yes Chanceler Pullin, Isidro Margo, MD  pantoprazole  (PROTONIX ) 40 MG tablet Take 1 tablet (40 mg total) by mouth daily. 10/03/23  Yes Adelia Homestead, MD  predniSONE  (DELTASONE ) 20 MG tablet Take 2 tablets (40 mg total) by mouth daily for 5 days. 04/13/24 04/18/24 Yes Spence Dux, PA-C  metFORMIN  (GLUCOPHAGE -XR) 500 MG 24 hr tablet Take 1 tablet (500 mg total) by mouth daily with breakfast. Patient not taking: Reported on 04/17/2024 10/03/23   Adelia Homestead, MD  oxyCODONE -acetaminophen  (PERCOCET/ROXICET) 5-325 MG tablet Take 1 tablet by mouth every 6 (six) hours as needed for severe pain (pain score 7-10). 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   Unilateral primary osteoarthritis, left knee 04/13/2022   Adjustment disorder 11/09/2021   Acute rheumatoid arthritis (HCC) 02/11/2016   Arthritis 05/01/2015   Morbid obesity (HCC) 10/27/2014   Essential hypertension 10/27/2014    Past Medical History:  Diagnosis Date   Back pain    Hypertension    Knee pain     Past Surgical History:  Procedure Laterality Date   ECTOPIC PREGNANCY SURGERY       Social History   Socioeconomic History   Marital status: Widowed    Spouse name: Not on file   Number of children: Not on file   Years of education: Not on file   Highest education level: Not on file  Occupational History   Not on file  Tobacco Use   Smoking status: Never   Smokeless tobacco: Never  Substance and Sexual Activity   Alcohol use: No    Comment: occ   Drug use: No   Sexual activity: Not on file  Other Topics Concern   Not on file  Social History Narrative   Not on file   Social Drivers of Health   Financial Resource Strain: Not on file  Food Insecurity: Not on file  Transportation Needs: Not on file  Physical Activity: Not on file  Stress: Not on file  Social Connections: Not on file  Intimate Partner Violence: Not on file    Family History  Problem Relation Age of Onset   Hypertension Other      Review of Systems  Constitutional: Negative.  Negative for chills and fever.  HENT:  Negative for congestion and sore throat.   Respiratory: Negative.  Negative for cough and shortness of breath.   Cardiovascular: Negative.  Negative for chest pain and palpitations.  Gastrointestinal:  Negative for abdominal pain, diarrhea, nausea and vomiting.  Genitourinary: Negative.  Negative for dysuria and hematuria.  Skin: Negative.  Negative for rash.  Neurological:  Negative for dizziness and headaches.  All other systems reviewed and are negative.   Vitals:   04/17/24 1440  BP: (!) 133/95  Pulse: 76  Temp: 98.5 F (36.9 C)  SpO2: 96%    Physical Exam Vitals reviewed.  Constitutional:      Appearance: She is obese.  HENT:     Head: Normocephalic.  Eyes:     Extraocular Movements: Extraocular movements intact.  Cardiovascular:     Rate and Rhythm: Normal rate.  Pulmonary:     Effort: Pulmonary effort is normal.  Musculoskeletal:     Comments: Bilateral ankle edema  Skin:    General: Skin is warm and dry.  Neurological:     Mental Status:  She is alert and oriented to person, place, and time.  Psychiatric:        Mood and Affect: Mood normal.        Behavior: Behavior normal.      ASSESSMENT & PLAN: A total of 42 minutes was spent with the patient and counseling/coordination of care regarding preparing for this visit, review of most recent office visit notes, review of most recent emergency department visit notes, review of most recent blood work results, review of most recent x-ray report, cardiovascular risks associated with hypertension, review of all medications, education on nutrition, prognosis, documentation, and need for follow-up with PCP.  Problem List Items Addressed This Visit       Cardiovascular and Mediastinum   Essential hypertension - Primary   BP Readings  from Last 3 Encounters:  04/17/24 (!) 133/95  04/13/24 (!) 194/121  10/03/23 (!) 146/100  Cardiovascular risks associated with hypertension discussed Dietary approaches to stop hypertension discussed Recommend to continue amlodipine 5 mg daily Advised to monitor blood pressure readings at home daily for the next several weeks and contact the office if numbers persistently abnormal. Need to follow-up with PCP within the next couple weeks         Musculoskeletal and Integument   Uric acid arthropathy   Recent flare.  Better today. Continues daily allopurinol . Advised to take daily colchicine  as well Recommend to take meloxicam 15 mg daily for the next 7 days Just finishing course of corticosteroid      Relevant Medications   meloxicam (MOBIC) 15 MG tablet     Other   Morbid obesity (HCC)   Diet and nutrition discussed Interested in weight loss medications Advised to follow-up with PCP for this      Other Visit Diagnoses       Acute gouty arthritis       Relevant Medications   meloxicam (MOBIC) 15 MG tablet      Patient Instructions  Hypertension, Adult High blood pressure (hypertension) is when the force of blood pumping  through the arteries is too strong. The arteries are the blood vessels that carry blood from the heart throughout the body. Hypertension forces the heart to work harder to pump blood and may cause arteries to become narrow or stiff. Untreated or uncontrolled hypertension can lead to a heart attack, heart failure, a stroke, kidney disease, and other problems. A blood pressure reading consists of a higher number over a lower number. Ideally, your blood pressure should be below 120/80. The first ("top") number is called the systolic pressure. It is a measure of the pressure in your arteries as your heart beats. The second ("bottom") number is called the diastolic pressure. It is a measure of the pressure in your arteries as the heart relaxes. What are the causes? The exact cause of this condition is not known. There are some conditions that result in high blood pressure. What increases the risk? Certain factors may make you more likely to develop high blood pressure. Some of these risk factors are under your control, including: Smoking. Not getting enough exercise or physical activity. Being overweight. Having too much fat, sugar, calories, or salt (sodium) in your diet. Drinking too much alcohol. Other risk factors include: Having a personal history of heart disease, diabetes, high cholesterol, or kidney disease. Stress. Having a family history of high blood pressure and high cholesterol. Having obstructive sleep apnea. Age. The risk increases with age. What are the signs or symptoms? High blood pressure may not cause symptoms. Very high blood pressure (hypertensive crisis) may cause: Headache. Fast or irregular heartbeats (palpitations). Shortness of breath. Nosebleed. Nausea and vomiting. Vision changes. Severe chest pain, dizziness, and seizures. How is this diagnosed? This condition is diagnosed by measuring your blood pressure while you are seated, with your arm resting on a flat  surface, your legs uncrossed, and your feet flat on the floor. The cuff of the blood pressure monitor will be placed directly against the skin of your upper arm at the level of your heart. Blood pressure should be measured at least twice using the same arm. Certain conditions can cause a difference in blood pressure between your right and left arms. If you have a high blood pressure reading during one visit or you have normal blood pressure  with other risk factors, you may be asked to: Return on a different day to have your blood pressure checked again. Monitor your blood pressure at home for 1 week or longer. If you are diagnosed with hypertension, you may have other blood or imaging tests to help your health care provider understand your overall risk for other conditions. How is this treated? This condition is treated by making healthy lifestyle changes, such as eating healthy foods, exercising more, and reducing your alcohol intake. You may be referred for counseling on a healthy diet and physical activity. Your health care provider may prescribe medicine if lifestyle changes are not enough to get your blood pressure under control and if: Your systolic blood pressure is above 130. Your diastolic blood pressure is above 80. Your personal target blood pressure may vary depending on your medical conditions, your age, and other factors. Follow these instructions at home: Eating and drinking  Eat a diet that is high in fiber and potassium, and low in sodium, added sugar, and fat. An example of this eating plan is called the DASH diet. DASH stands for Dietary Approaches to Stop Hypertension. To eat this way: Eat plenty of fresh fruits and vegetables. Try to fill one half of your plate at each meal with fruits and vegetables. Eat whole grains, such as whole-wheat pasta, brown rice, or whole-grain bread. Fill about one fourth of your plate with whole grains. Eat or drink low-fat dairy products, such as  skim milk or low-fat yogurt. Avoid fatty cuts of meat, processed or cured meats, and poultry with skin. Fill about one fourth of your plate with lean proteins, such as fish, chicken without skin, beans, eggs, or tofu. Avoid pre-made and processed foods. These tend to be higher in sodium, added sugar, and fat. Reduce your daily sodium intake. Many people with hypertension should eat less than 1,500 mg of sodium a day. Do not drink alcohol if: Your health care provider tells you not to drink. You are pregnant, may be pregnant, or are planning to become pregnant. If you drink alcohol: Limit how much you have to: 0-1 drink a day for women. 0-2 drinks a day for men. Know how much alcohol is in your drink. In the [place] one drink equals one 12 oz bottle of beer (355 mL), one 5 oz glass of wine (148 mL), or one 1 oz glass of hard liquor (44 mL). Lifestyle  Work with your health care provider to maintain a healthy body weight or to lose weight. Ask what an ideal weight is for you. Get at least 30 minutes of exercise that causes your heart to beat faster (aerobic exercise) most days of the week. Activities may include walking, swimming, or biking. Include exercise to strengthen your muscles (resistance exercise), such as Pilates or lifting weights, as part of your weekly exercise routine. Try to do these types of exercises for 30 minutes at least 3 days a week. Do not use any products that contain nicotine or tobacco. These products include cigarettes, chewing tobacco, and vaping devices, such as e-cigarettes. If you need help quitting, ask your health care provider. Monitor your blood pressure at home as told by your health care provider. Keep all follow-up visits. This is important. Medicines Take over-the-counter and prescription medicines only as told by your health care provider. Follow directions carefully. Blood pressure medicines must be taken as prescribed. Do not skip doses of blood pressure  medicine. Doing this puts you at risk for problems and  can make the medicine less effective. Ask your health care provider about side effects or reactions to medicines that you should watch for. Contact a health care provider if you: Think you are having a reaction to a medicine you are taking. Have headaches that keep coming back (recurring). Feel dizzy. Have swelling in your ankles. Have trouble with your vision. Get help right away if you: Develop a severe headache or confusion. Have unusual weakness or numbness. Feel faint. Have severe pain in your chest or abdomen. Vomit repeatedly. Have trouble breathing. These symptoms may be an emergency. Get help right away. Call 911. Do not wait to see if the symptoms will go away. Do not drive yourself to the hospital. Summary Hypertension is when the force of blood pumping through your arteries is too strong. If this condition is not controlled, it may put you at risk for serious complications. Your personal target blood pressure may vary depending on your medical conditions, your age, and other factors. For most people, a normal blood pressure is less than 120/80. Hypertension is treated with lifestyle changes, medicines, or a combination of both. Lifestyle changes include losing weight, eating a healthy, low-sodium diet, exercising more, and limiting alcohol. This information is not intended to replace advice given to you by your health care provider. Make sure you discuss any questions you have with your health care provider. Document Revised: 09/14/2021 Document Reviewed: 09/14/2021 Elsevier Patient Education  2024 Elsevier Inc.     Maryagnes Small, MD [facility]

## 2024-04-17 NOTE — Assessment & Plan Note (Signed)
 BP Readings from Last 3 Encounters:  04/17/24 (!) 133/95  04/13/24 (!) 194/121  10/03/23 (!) 146/100  Cardiovascular risks associated with hypertension discussed Dietary approaches to stop hypertension discussed Recommend to continue amlodipine 5 mg daily Advised to monitor blood pressure readings at home daily for the next several weeks and contact the office if numbers persistently abnormal. Need to follow-up with PCP within the next couple weeks

## 2024-04-17 NOTE — Assessment & Plan Note (Addendum)
 Recent flare.  Better today. Continues daily allopurinol . Advised to take daily colchicine  as well Recommend to take meloxicam 15 mg daily for the next 7 days Just finishing course of corticosteroid

## 2024-05-10 ENCOUNTER — Other Ambulatory Visit: Payer: Self-pay | Admitting: Internal Medicine

## 2024-05-10 ENCOUNTER — Telehealth: Payer: Self-pay | Admitting: Internal Medicine

## 2024-05-10 NOTE — Telephone Encounter (Signed)
 Copied from CRM [id number]. Topic: Clinical - Medical Advice >> May 10, 2024  1:12 PM Baldo Levan wrote: Reason for CRM: Patient is calling in stating she is having another flair up of Gout, patient is asking if the doctor can call in the prednisone  again for her because it seemed to help last time. Patient can not afford the co pay right now for a appointment.

## 2024-05-16 NOTE — Telephone Encounter (Signed)
**Note De-identified  [place] Obfuscation** Please advise 

## 2024-09-02 ENCOUNTER — Ambulatory Visit: Admitting: Internal Medicine

## 2024-09-02 ENCOUNTER — Encounter: Payer: Self-pay | Admitting: Internal Medicine

## 2024-09-02 ENCOUNTER — Ambulatory Visit (INDEPENDENT_AMBULATORY_CARE_PROVIDER_SITE_OTHER)

## 2024-09-02 VITALS — BP 130/86 | HR 90 | Temp 97.6°F | Ht 65.5 in | Wt 363.0 lb

## 2024-09-02 DIAGNOSIS — I10 Essential (primary) hypertension: Secondary | ICD-10-CM | POA: Diagnosis not present

## 2024-09-02 DIAGNOSIS — M7989 Other specified soft tissue disorders: Secondary | ICD-10-CM | POA: Diagnosis not present

## 2024-09-02 DIAGNOSIS — M109 Gout, unspecified: Secondary | ICD-10-CM

## 2024-09-02 DIAGNOSIS — M79672 Pain in left foot: Secondary | ICD-10-CM

## 2024-09-02 DIAGNOSIS — M19072 Primary osteoarthritis, left ankle and foot: Secondary | ICD-10-CM | POA: Diagnosis not present

## 2024-09-02 DIAGNOSIS — M2012 Hallux valgus (acquired), left foot: Secondary | ICD-10-CM | POA: Diagnosis not present

## 2024-09-02 DIAGNOSIS — R7303 Prediabetes: Secondary | ICD-10-CM

## 2024-09-02 DIAGNOSIS — G4733 Obstructive sleep apnea (adult) (pediatric): Secondary | ICD-10-CM

## 2024-09-02 LAB — COMPREHENSIVE METABOLIC PANEL WITH GFR
ALT: 19 U/L (ref 0–35)
AST: 19 U/L (ref 0–37)
Albumin: 4.3 g/dL (ref 3.5–5.2)
Alkaline Phosphatase: 79 U/L (ref 39–117)
BUN: 10 mg/dL (ref 6–23)
CO2: 29 meq/L (ref 19–32)
Calcium: 9.2 mg/dL (ref 8.4–10.5)
Chloride: 102 meq/L (ref 96–112)
Creatinine, Ser: 0.97 mg/dL (ref 0.40–1.20)
GFR: 65.25 mL/min (ref >60.00)
Glucose, Bld: 113 mg/dL — ABNORMAL HIGH (ref 70–99)
Potassium: 3.8 meq/L (ref 3.5–5.1)
Sodium: 139 meq/L (ref 135–145)
Total Bilirubin: 0.4 mg/dL (ref 0.2–1.2)
Total Protein: 7.9 g/dL (ref 6.0–8.3)

## 2024-09-02 LAB — CBC
HCT: 39.2 % (ref 36.0–46.0)
Hemoglobin: 12.7 g/dL (ref 12.0–15.0)
MCHC: 32.2 g/dL (ref 30.0–36.0)
MCV: 91.9 fl (ref 78.0–100.0)
Platelets: 221 K/uL (ref 150.0–400.0)
RBC: 4.27 Mil/uL (ref 3.87–5.11)
RDW: 15.8 % — ABNORMAL HIGH (ref 11.5–15.5)
WBC: 5.9 K/uL (ref 4.0–10.5)

## 2024-09-02 LAB — URINALYSIS, ROUTINE W REFLEX MICROSCOPIC
Bilirubin Urine: NEGATIVE
Hgb urine dipstick: NEGATIVE
Ketones, ur: NEGATIVE
Nitrite: NEGATIVE
Specific Gravity, Urine: 1.015 (ref 1.000–1.030)
Total Protein, Urine: NEGATIVE
Urine Glucose: NEGATIVE
Urobilinogen, UA: 0.2 (ref 0.0–1.0)
pH: 6.5 (ref 5.0–8.0)

## 2024-09-02 LAB — LIPID PANEL
Cholesterol: 189 mg/dL (ref 0–200)
HDL: 48.4 mg/dL (ref >39.00)
LDL Cholesterol: 111 mg/dL — ABNORMAL HIGH (ref 0–99)
NonHDL: 140.13
Total CHOL/HDL Ratio: 4
Triglycerides: 146 mg/dL (ref 0.0–149.0)
VLDL: 29.2 mg/dL (ref 0.0–40.0)

## 2024-09-02 LAB — URIC ACID: Uric Acid, Serum: 7.2 mg/dL — ABNORMAL HIGH (ref 2.4–7.0)

## 2024-09-02 LAB — HEMOGLOBIN A1C: Hgb A1c MFr Bld: 6.1 % (ref 4.6–6.5)

## 2024-09-02 MED ORDER — ZEPBOUND 7.5 MG/0.5ML ~~LOC~~ SOAJ: 7.5000 mg | SUBCUTANEOUS | 0 refills | Status: DC

## 2024-09-02 MED ORDER — ZEPBOUND 5 MG/0.5ML ~~LOC~~ SOAJ: 5.0000 mg | SUBCUTANEOUS | 0 refills | Status: DC

## 2024-09-02 MED ORDER — HYDROCHLOROTHIAZIDE 25 MG PO TABS: 25.0000 mg | ORAL_TABLET | Freq: Every day | ORAL | 3 refills | Status: AC

## 2024-09-02 MED ORDER — ZEPBOUND 2.5 MG/0.5ML ~~LOC~~ SOAJ: 2.5000 mg | SUBCUTANEOUS | 0 refills | Status: DC

## 2024-09-02 NOTE — Progress Notes (Unsigned)
 Subjective:   Patient ID: Katherine Browning, female    DOB: January 09, 1966, 58 y.o.   MRN: [id number]  The patient is here for visit. Pertinent topics discussed: Discussed the use of AI scribe software for clinical note transcription with the patient, who gave verbal consent to proceed.  History of Present Illness Katherine Browning is a 58 year old female with hypertension and gout who presents for a well check and blood pressure management.  Her blood pressure medication was changed to amlodipine  in the spring after a visit to the emergency room for a gout flare-up, during which her blood pressure was noted to be high. She experienced adverse effects from amlodipine , including eye and dental issues, as well as white splotches on her skin, leading her to discontinue the medication. She has been managing her blood pressure with clonidine , which she takes twice a day. She previously experienced an allergic reaction to lisinopril , which caused facial and lip swelling.  She was recently diagnosed with sleep apnea following a sleep study. She was advised to use a CPAP machine, but the cost is prohibitive for her.  She has been on metformin  for insulin resistance and weight management, but it caused frequent bathroom visits without noticeable weight loss. She has returned to a pescatarian diet but is not currently exercising due to joint problems.  Today, her left foot was rolled over by a rollator, causing pain across the midfoot and toes, but not affecting the big toe or ankle. She describes the pain as significant. No pain in the big toe or ankle of the left foot.  PMH, [facility], social history reviewed and updated  Review of Systems  Constitutional: Negative.   HENT: Negative.    Eyes: Negative.   Respiratory:  Negative for cough, chest tightness and shortness of breath.   Cardiovascular:  Negative for chest pain, palpitations and leg swelling.  Gastrointestinal:  Negative for abdominal distention,  abdominal pain, constipation, diarrhea, nausea and vomiting.  Musculoskeletal:  Positive for arthralgias and myalgias. Negative for gait problem and joint swelling.  Skin: Negative.   Neurological: Negative.   Psychiatric/Behavioral: Negative.      Objective:  Physical Exam Constitutional:      Appearance: She is well-developed. She is obese.  HENT:     Head: Normocephalic and atraumatic.  Cardiovascular:     Rate and Rhythm: Normal rate and regular rhythm.  Pulmonary:     Effort: Pulmonary effort is normal. No respiratory distress.     Breath sounds: Normal breath sounds. No wheezing or rales.  Abdominal:     General: Bowel sounds are normal. There is no distension.     Palpations: Abdomen is soft.     Tenderness: There is no abdominal tenderness.  Musculoskeletal:        General: Tenderness present.     Cervical back: Normal range of motion.     Comments: Left foot pain 2-5th toes no pain in the midfoot or ankle  Skin:    General: Skin is warm and dry.  Neurological:     Mental Status: She is alert and oriented to person, place, and time.     Coordination: Coordination normal.     Vitals:   09/02/24 1536  BP: 130/86  Pulse: 90  Temp: 97.6 F (36.4 C)  TempSrc: Temporal  SpO2: 97%  Weight: (!) 363 lb (164.7 kg)  Height: 5' 5.5 (1.664 m)    Assessment & Plan:  Assessment and Plan Assessment & Plan Hypertension  Hypertension is managed with clonidine , now increased to twice daily. Amlodipine  was discontinued due to adverse effects, and lisinopril  was stopped because of angioedema. Transition to hydrochlorothiazide is planned to simplify the regimen and prevent clonidine  rebound hypertension. Considering 6 month supply of clonidine  was last prescribed 10 months ago adherence is suspect. Prescribe hydrochlorothiazide 25 mg daily and instruct her to discontinue clonidine  upon starting it. Schedule a follow-up in 2-3 weeks to assess blood pressure control and recheck  labs. Checking U/A to assess for protein.  Gout   A recent severe gout flare may have contributed to elevated blood pressure. Checking uric acid.   Obstructive sleep apnea   She is diagnosed with obstructive sleep apnea, but CPAP therapy is cost-prohibitive. Zepbound was discussed for weight loss to improve sleep apnea. Prescribe Zepbound and send the prescription to both the local pharmacy and [facility].  Insulin resistance and Morbid obesity   Metformin  was discontinued. Zepbound was discussed for weight loss and insulin resistance management. Prescribe Zepbound for weight loss.  Left foot pain after trauma   She experiences left foot pain post-trauma from a rollator, localized across the midfoot and toes, sparing the big toe and ankle. Order an x-ray of the left foot.

## 2024-09-02 NOTE — Patient Instructions (Signed)
 We have sent in the new medicine hydrochlorothiazide for blood pressure. When you start this stop taking the clonidine .  We have sent in the zepbound to start taking 2.5 mg weekly for month 1 then increase to 5 mg weekly for month 2 then increase to 7.5 mg weekly for month 3.  We are checking the labs and x-ray of the foot.

## 2024-09-04 ENCOUNTER — Other Ambulatory Visit (HOSPITAL_COMMUNITY): Payer: Self-pay

## 2024-09-04 ENCOUNTER — Ambulatory Visit: Payer: Self-pay | Admitting: Internal Medicine

## 2024-09-04 ENCOUNTER — Telehealth: Payer: Self-pay

## 2024-09-04 DIAGNOSIS — M79672 Pain in left foot: Secondary | ICD-10-CM | POA: Insufficient documentation

## 2024-09-04 DIAGNOSIS — G4733 Obstructive sleep apnea (adult) (pediatric): Secondary | ICD-10-CM | POA: Insufficient documentation

## 2024-09-04 DIAGNOSIS — M0579 Rheumatoid arthritis with rheumatoid factor of multiple sites without organ or systems involvement: Secondary | ICD-10-CM

## 2024-09-04 NOTE — Assessment & Plan Note (Signed)
 Metformin  was discontinued. Zepbound was discussed for weight loss and insulin resistance management. Prescribe Zepbound for weight loss and co-manage OSA

## 2024-09-04 NOTE — Telephone Encounter (Addendum)
 Prior Authorization form/request asks a question that requires your assistance. Please see the question below and advise accordingly. The PA will not be submitted until the necessary information is received.   Please see Insurance question below and please note that we will need a response before submitting           Latent key/cmm : [id number]

## 2024-09-04 NOTE — Assessment & Plan Note (Signed)
 We are able to see sleep study with sleep apnea but not the AHI. She has been prescribed CPAP which she did not start due to cost. Rx zepbound.

## 2024-09-04 NOTE — Assessment & Plan Note (Signed)
 She experiences left foot pain post-trauma from a rollator, localized across the midfoot and toes, sparing the big toe and ankle. Order an x-ray of the left foot.

## 2024-09-04 NOTE — Assessment & Plan Note (Signed)
 Amlodipine  was discontinued due to adverse effects, and lisinopril  was stopped because of angioedema. Transition to hydrochlorothiazide is planned to simplify the regimen and prevent clonidine  rebound hypertension. Considering 6 month supply of clonidine  was last prescribed 10 months ago adherence is suspect. Prescribe hydrochlorothiazide 25 mg daily and instruct her to discontinue clonidine  upon starting it. Schedule a follow-up in 2-3 weeks to assess blood pressure control and recheck labs. Checking U/A to assess for protein.

## 2024-09-04 NOTE — Assessment & Plan Note (Signed)
 A recent severe gout flare may have contributed to elevated blood pressure. Checking uric acid and adjust allopurinol  300 mg daily.

## 2024-09-04 NOTE — Assessment & Plan Note (Signed)
 Checking HgA1c and adjust as needed.

## 2024-09-05 NOTE — Telephone Encounter (Signed)
 please advise as I do not see

## 2024-09-09 ENCOUNTER — Other Ambulatory Visit (HOSPITAL_COMMUNITY): Payer: Self-pay

## 2024-09-09 NOTE — Telephone Encounter (Signed)
 Pharmacy Patient Advocate Encounter  Received notification from [facility] that Prior Authorization for Zepbound 2.5mg /0.27ml has been APPROVED  to 4.18.26. Ran test claim, Copay is $366.94. This test claim was processed through [facility]- copay amounts may vary at other pharmacies due to pharmacy/plan contracts, or as the patient moves through the different stages of their insurance plan.   PA #/Case ID/Reference #: [id number]

## 2024-09-09 NOTE — Telephone Encounter (Signed)
 Yes she has OSA which saxenda does not treat and zepbound does.

## 2024-09-16 ENCOUNTER — Other Ambulatory Visit (HOSPITAL_COMMUNITY): Payer: Self-pay

## 2024-09-23 ENCOUNTER — Other Ambulatory Visit (HOSPITAL_COMMUNITY): Payer: Self-pay

## 2024-09-24 NOTE — Telephone Encounter (Signed)
 Completed.

## 2024-09-25 NOTE — Addendum Note (Signed)
 Addended by: ROLLENE NORRIS A on: 09/25/2024 07:40 AM   Modules accepted: Orders

## 2024-10-05 ENCOUNTER — Encounter (HOSPITAL_COMMUNITY): Payer: Self-pay

## 2024-10-05 ENCOUNTER — Inpatient Hospital Stay (HOSPITAL_COMMUNITY)
Admission: EM | Admit: 2024-10-05 | Discharge: 2024-10-09 | DRG: 281 | Disposition: A | Discharge status: Home / Self Care | Attending: Family Medicine | Admitting: Family Medicine

## 2024-10-05 ENCOUNTER — Emergency Department (HOSPITAL_COMMUNITY)

## 2024-10-05 DIAGNOSIS — R079 Chest pain, unspecified: Secondary | ICD-10-CM | POA: Diagnosis present

## 2024-10-05 DIAGNOSIS — R0789 Other chest pain: Secondary | ICD-10-CM | POA: Diagnosis not present

## 2024-10-05 DIAGNOSIS — Z7985 Long-term (current) use of injectable non-insulin antidiabetic drugs: Secondary | ICD-10-CM | POA: Diagnosis not present

## 2024-10-05 DIAGNOSIS — M109 Gout, unspecified: Secondary | ICD-10-CM | POA: Diagnosis present

## 2024-10-05 DIAGNOSIS — I214 Non-ST elevation (NSTEMI) myocardial infarction: Secondary | ICD-10-CM | POA: Diagnosis not present

## 2024-10-05 DIAGNOSIS — I10 Essential (primary) hypertension: Secondary | ICD-10-CM | POA: Diagnosis present

## 2024-10-05 DIAGNOSIS — M25531 Pain in right wrist: Secondary | ICD-10-CM | POA: Diagnosis not present

## 2024-10-05 DIAGNOSIS — K219 Gastro-esophageal reflux disease without esophagitis: Secondary | ICD-10-CM | POA: Diagnosis not present

## 2024-10-05 DIAGNOSIS — Z91041 Radiographic dye allergy status: Secondary | ICD-10-CM

## 2024-10-05 DIAGNOSIS — E88819 Insulin resistance, unspecified: Secondary | ICD-10-CM | POA: Diagnosis not present

## 2024-10-05 DIAGNOSIS — E785 Hyperlipidemia, unspecified: Secondary | ICD-10-CM | POA: Diagnosis not present

## 2024-10-05 DIAGNOSIS — Z885 Allergy status to narcotic agent status: Secondary | ICD-10-CM

## 2024-10-05 DIAGNOSIS — I160 Hypertensive urgency: Secondary | ICD-10-CM | POA: Diagnosis not present

## 2024-10-05 DIAGNOSIS — Z881 Allergy status to other antibiotic agents status: Secondary | ICD-10-CM

## 2024-10-05 DIAGNOSIS — G4733 Obstructive sleep apnea (adult) (pediatric): Secondary | ICD-10-CM | POA: Diagnosis not present

## 2024-10-05 DIAGNOSIS — I21A1 Myocardial infarction type 2: Secondary | ICD-10-CM | POA: Diagnosis not present

## 2024-10-05 DIAGNOSIS — I161 Hypertensive emergency: Principal | ICD-10-CM | POA: Diagnosis present

## 2024-10-05 DIAGNOSIS — Z8249 Family history of ischemic heart disease and other diseases of the circulatory system: Secondary | ICD-10-CM

## 2024-10-05 DIAGNOSIS — Z888 Allergy status to other drugs, medicaments and biological substances status: Secondary | ICD-10-CM

## 2024-10-05 DIAGNOSIS — Z79899 Other long term (current) drug therapy: Secondary | ICD-10-CM

## 2024-10-05 DIAGNOSIS — Z6841 Body Mass Index (BMI) 40.0 and over, adult: Secondary | ICD-10-CM | POA: Diagnosis not present

## 2024-10-05 DIAGNOSIS — N179 Acute kidney failure, unspecified: Secondary | ICD-10-CM | POA: Diagnosis not present

## 2024-10-05 LAB — BASIC METABOLIC PANEL WITH GFR
Anion gap: 12 (ref 5–15)
BUN: 14 mg/dL (ref 6–20)
CO2: 25 mmol/L (ref 22–32)
Calcium: 10 mg/dL (ref 8.9–10.3)
Chloride: 104 mmol/L (ref 98–111)
Creatinine, Ser: 1.01 mg/dL — ABNORMAL HIGH (ref 0.44–1.00)
GFR, Estimated: >60 mL/min (ref >60)
Glucose, Bld: 105 mg/dL — ABNORMAL HIGH (ref 70–99)
Potassium: 4.4 mmol/L (ref 3.5–5.1)
Sodium: 141 mmol/L (ref 135–145)

## 2024-10-05 LAB — CBC WITH DIFFERENTIAL/PLATELET
Abs Immature Granulocytes: 0.04 K/uL (ref 0.00–0.07)
Basophils Absolute: 0.1 K/uL (ref 0.0–0.1)
Basophils Relative: 1 %
Eosinophils Absolute: 0.1 K/uL (ref 0.0–0.5)
Eosinophils Relative: 1 %
HCT: 43.7 % (ref 36.0–46.0)
Hemoglobin: 14.2 g/dL (ref 12.0–15.0)
Immature Granulocytes: 1 %
Lymphocytes Relative: 26 %
Lymphs Abs: 1.8 K/uL (ref 0.7–4.0)
MCH: 30.3 pg (ref 26.0–34.0)
MCHC: 32.5 g/dL (ref 30.0–36.0)
MCV: 93.4 fL (ref 80.0–100.0)
Monocytes Absolute: 0.5 K/uL (ref 0.1–1.0)
Monocytes Relative: 7 %
Neutro Abs: 4.5 K/uL (ref 1.7–7.7)
Neutrophils Relative %: 64 %
Platelets: 240 K/uL (ref 150–400)
RBC: 4.68 MIL/uL (ref 3.87–5.11)
RDW: 15.1 % (ref 11.5–15.5)
WBC: 7 K/uL (ref 4.0–10.5)
nRBC: 0 % (ref 0.0–0.2)

## 2024-10-05 LAB — TROPONIN T, HIGH SENSITIVITY
Troponin T High Sensitivity: 58 ng/L — ABNORMAL HIGH (ref 0–19)
Troponin T High Sensitivity: 158 ng/L (ref 0–19)

## 2024-10-05 MED ORDER — HEPARIN (PORCINE) 25000 UT/250ML-% IV SOLN
1750.0000 [IU]/h | INTRAVENOUS | Status: DC
  Administered 2024-10-05: 1300 [IU]/h via INTRAVENOUS
  Administered 2024-10-07 (×2): 1750 [IU]/h via INTRAVENOUS
  Filled 2024-10-05 (×4): qty 250

## 2024-10-05 MED ORDER — ALUM & MAG HYDROXIDE-SIMETH 200-200-20 MG/5ML PO SUSP
30.0000 mL | Freq: Once | ORAL | Status: AC
  Administered 2024-10-05: 30 mL via ORAL
  Filled 2024-10-05: qty 30

## 2024-10-05 MED ORDER — HEPARIN BOLUS VIA INFUSION
4000.0000 [IU] | Freq: Once | INTRAVENOUS | Status: AC
  Administered 2024-10-05: 4000 [IU] via INTRAVENOUS
  Filled 2024-10-05: qty 4000

## 2024-10-05 MED ORDER — HYDRALAZINE HCL 20 MG/ML IJ SOLN
10.0000 mg | Freq: Once | INTRAMUSCULAR | Status: AC
  Administered 2024-10-05: 10 mg via INTRAVENOUS
  Filled 2024-10-05: qty 1

## 2024-10-05 MED ORDER — ASPIRIN 81 MG PO CHEW
324.0000 mg | CHEWABLE_TABLET | Freq: Once | ORAL | Status: AC
  Administered 2024-10-05: 324 mg via ORAL
  Filled 2024-10-05: qty 4

## 2024-10-05 MED ORDER — LIDOCAINE VISCOUS HCL 2 % MT SOLN
15.0000 mL | Freq: Once | OROMUCOSAL | Status: AC
  Administered 2024-10-05: 15 mL via ORAL
  Filled 2024-10-05: qty 15

## 2024-10-05 NOTE — ED Notes (Signed)
 Attempt to start IV x2, unsuccessful, IV Team consulted

## 2024-10-05 NOTE — ED Triage Notes (Signed)
 Pt BIB ems for having chest pain that comes and goes, along with indigestion, states this has happen before. Hx of acid reflux, GERD. Recently change her BP meds, hypertensive today. A&Ox4

## 2024-10-05 NOTE — ED Provider Notes (Signed)
 [place] EMERGENCY DEPARTMENT AT [facility] Provider Note   CSN: [id number] Arrival date & time: 10/05/24  1256     Patient presents with: Chest Pain and Gastroesophageal Reflux   Katherine Browning is a 58 y.o. female.  With a history of GERD, hypertension and obesity who presents to the ED for chest pain.  Patient works as a engineer, structural on night shift.  When she returned home earlier this morning she had a meal of cream spinach, mashed potatoes and lamb chops.  She laid down and shortly thereafter began to have right sided chest pain.  Feels similar to prior episodes of GERD however this is usually resolved after drinking some water which did not help much today.  No other medications prior to arrival.  Still having some right sided chest pain mild nausea.  No shortness of breath abdominal pain or other complaints at this time    Chest Pain Gastroesophageal Reflux Associated symptoms include chest pain.       Prior to Admission medications   Medication Sig Start Date End Date Taking? Authorizing Provider  allopurinol  (ZYLOPRIM ) 300 MG tablet Take 1 tablet (300 mg total) by mouth daily. 10/03/23  Yes Rollene Almarie LABOR, MD  colchicine  0.6 MG tablet TAKE 1 TABLET(0.6 MG) BY MOUTH DAILY AS NEEDED 05/17/24  Yes Rollene Almarie LABOR, MD  hydrochlorothiazide (HYDRODIURIL) 25 MG tablet Take 1 tablet (25 mg total) by mouth daily. 09/02/24  Yes Rollene Almarie LABOR, MD  cyclobenzaprine  (FLEXERIL ) 10 MG tablet Take 1 tablet (10 mg total) by mouth 2 (two) times daily. 10/03/23   Rollene Almarie LABOR, MD  pantoprazole  (PROTONIX ) 40 MG tablet Take 1 tablet (40 mg total) by mouth daily. 10/03/23   Rollene Almarie LABOR, MD  tirzepatide [facility]) 2.5 MG/0.5ML Pen Inject 2.5 mg into the skin once a week. 09/02/24   Rollene Almarie LABOR, MD  tirzepatide [facility]) 5 MG/0.5ML Pen Inject 5 mg into the skin once a week. 09/02/24   Rollene Almarie LABOR, MD  tirzepatide (ZEPBOUND) 7.5  MG/0.5ML Pen Inject 7.5 mg into the skin once a week. 09/02/24   Rollene Almarie LABOR, MD    Allergies: Ivp dye [iodinated contrast media], Biaxin [clarithromycin], Iodine, Lisinopril , and Vicodin [hydrocodone -acetaminophen ]    Review of Systems  Cardiovascular:  Positive for chest pain.    Updated Vital Signs BP (!) 177/98   Pulse 100   Temp 98.2 F (36.8 C) (Oral)   Resp 19   Ht 5' 5 (1.651 m)   Wt (!) 164.7 kg   LMP 02/19/2018 Comment: neg preg test  SpO2 98%   BMI 60.42 kg/m   Physical Exam Vitals and nursing note reviewed.  HENT:     Head: Normocephalic and atraumatic.  Eyes:     Pupils: Pupils are equal, round, and reactive to light.  Cardiovascular:     Rate and Rhythm: Normal rate and regular rhythm.  Pulmonary:     Effort: Pulmonary effort is normal.     Breath sounds: Normal breath sounds.  Abdominal:     Palpations: Abdomen is soft.     Tenderness: There is no abdominal tenderness.  Skin:    General: Skin is warm and dry.  Neurological:     Mental Status: She is alert.  Psychiatric:        Mood and Affect: Mood normal.     (all labs ordered are listed, but only abnormal results are displayed) Labs Reviewed  BASIC METABOLIC PANEL WITH GFR - Abnormal; Notable  for the following components:      Result Value   Glucose, Bld 105 (*)    Creatinine, Ser 1.01 (*)    All other components within normal limits  TROPONIN T, HIGH SENSITIVITY - Abnormal; Notable for the following components:   Troponin T High Sensitivity 58 (*)    All other components within normal limits  TROPONIN T, HIGH SENSITIVITY - Abnormal; Notable for the following components:   Troponin T High Sensitivity 158 (*)    All other components within normal limits  CBC WITH DIFFERENTIAL/PLATELET  HEPARIN LEVEL (UNFRACTIONATED)  CBC    EKG: EKG Interpretation Date/Time:  Saturday October 05 2024 13:39:30 EST Ventricular Rate:  96 PR Interval:  155 QRS Duration:  87 QT  Interval:  359 QTC Calculation: 454 R Axis:   -7  Text Interpretation: Sinus rhythm Probable left atrial enlargement Confirmed by Pamella Sharper [id number] on 10/05/2024 1:54:11 PM  Radiology: [name] Chest Portable 1 View Result Date: 10/05/2024 CLINICAL DATA:  Central chest pain radiating to right side EXAM: PORTABLE CHEST 1 VIEW COMPARISON:  05/21/2013 FINDINGS: Single frontal view of the chest demonstrates an unremarkable cardiac silhouette. No acute airspace disease, effusion, or pneumothorax. No acute bony abnormalities. IMPRESSION: 1. No acute intrathoracic process. Electronically Signed   By: Sharper Daring M.D.   On: 10/05/2024 15:12     .Critical Care  Performed by: Pamella Sharper LABOR, DO Authorized by: Pamella Sharper LABOR, DO   Critical care provider statement:    Critical care time (minutes):  30   Critical care was necessary to treat or prevent imminent or life-threatening deterioration of the following conditions:  Cardiac failure   Critical care was time spent personally by me on the following activities:  Development of treatment plan with patient or surrogate, discussions with consultants, evaluation of patient's response to treatment, examination of patient, ordering and review of laboratory studies, ordering and review of radiographic studies, ordering and performing treatments and interventions, pulse oximetry, re-evaluation of patient's condition, review of old charts and obtaining history from patient or surrogate   I assumed direction of critical care for this patient from another provider in my specialty: no     Care discussed with: admitting provider   Comments:     Discussed with cardiology Dr. Clorinda and admitting hospitalist Dr. Alfornia    Medications Ordered in the ED  heparin ADULT infusion 100 units/mL (25000 units/250mL) (1,300 Units/hr Intravenous New Bag/Given 10/05/24 2140)  alum & mag hydroxide-simeth (MAALOX/MYLANTA) 200-200-20 MG/5ML suspension 30 mL (30 mLs  Oral Given 10/05/24 1351)    And  lidocaine  (XYLOCAINE ) 2 % viscous mouth solution 15 mL (15 mLs Oral Given 10/05/24 1350)  aspirin chewable tablet 324 mg (324 mg Oral Given 10/05/24 1346)  heparin bolus via infusion 4,000 Units (4,000 Units Intravenous Bolus from Bag 10/05/24 2140)  hydrALAZINE (APRESOLINE) injection 10 mg (10 mg Intravenous Given 10/05/24 2208)    Clinical Course as of 10/05/24 2341  Sat Oct 05, 2024  2042 Initial troponin was significantly elevated at 58.  Unfortunately delta troponin has repeatedly hemolyzed with our lab.  We are sending another sample now.  Patient remains resting comfortably hemodynamically stable here hypertensive.  EKG repeated with no ischemic changes. [MP]  2125 Delta troponin has resulted.  Now up to 158.  Concern for NSTEMI.  Will give heparin bolus and start drip here and reach out to cardiology.  Patient remained stable at this time [MP]  2330 Discussed with admitting hospitalist Dr. Alfornia  who accepts patient for admission.  Blood pressure improved after dose of hydralazine. [MP]    Clinical Course User Index [MP] Pamella Ozell LABOR, DO                                 Medical Decision Making 58 year old female with history as above presents to the ED for chest pain started after laying down after a meal earlier this morning after her night shift persistent since the onset.  Well-appearing on my exam with no adventitious lung sounds or appreciable murmurs.  Differential diagnosis includes ACS, dysrhythmia, acid reflux, pneumonia viral respiratory illness.  Will obtain EKG high-sensitivity troponin laboratory workup and chest x-ray.  Will trial Maalox/viscous lidocaine  to see if that helps with her symptoms and give full dose aspirin given concern for potential ACS.  Amount and/or Complexity of Data Reviewed Labs: ordered. Radiology: ordered.  Risk OTC drugs. Prescription drug management. Decision regarding hospitalization.         Final diagnoses:  NSTEMI (non-ST elevated myocardial infarction) [facility])    ED Discharge Orders     None          Pamella Ozell LABOR, DO 10/05/24 2341

## 2024-10-05 NOTE — ED Notes (Signed)
 Sent blood work for repeat troponin down to lab x3, lab called again requesting a recollect!

## 2024-10-05 NOTE — H&P (Signed)
 History and Physical    Katherine Browning FMW:[id number] DOB: Jun 20, 1966 DOA: 10/05/2024  PCP: Rollene Almarie LABOR, MD  Patient coming from: Home  Chief Complaint: Chest pain  HPI: Katherine Browning is a 58 y.o. female with medical history significant of hypertension, gout, OSA not on CPAP, insulin resistance and morbid obesity (BMI 60.42) presenting with a chief complaint of chest pain.    ED Course: Blood pressure 204/112 on arrival.  EKG showing sinus rhythm and no STEMI.  No significant abnormalities on CBC with differential and BMP.  Troponin 58> 158.  Chest x-ray showing no acute intrathoracic process.  Patient was given Maalox, aspirin 324 mg, and IV hydralazine 10 mg. EDP discussed case with on-call cardiologist Dr. Cesario who recommended starting heparin drip for possible NSTEMI and felt that the patient could be admitted to [facility] long.  Review of Systems:  ROS  Past Medical History:  Diagnosis Date   Back pain    Hypertension    Knee pain     Past Surgical History:  Procedure Laterality Date   ECTOPIC PREGNANCY SURGERY       reports that she has never smoked. She has never used smokeless tobacco. She reports that she does not drink alcohol and does not use drugs.  Allergies  Allergen Reactions   Ivp Dye [Iodinated Contrast Media] Hives   Biaxin [Clarithromycin] Hives and Nausea And Vomiting   Iodine Hives   Lisinopril  Swelling   Vicodin [Hydrocodone -Acetaminophen ] Nausea And Vomiting    Family History  Problem Relation Age of Onset   Hypertension Other     Prior to Admission medications   Medication Sig Start Date End Date Taking? Authorizing Provider  allopurinol  (ZYLOPRIM ) 300 MG tablet Take 1 tablet (300 mg total) by mouth daily. 10/03/23  Yes Rollene Almarie LABOR, MD  colchicine  0.6 MG tablet TAKE 1 TABLET(0.6 MG) BY MOUTH DAILY AS NEEDED 05/17/24  Yes Rollene Almarie LABOR, MD  cyclobenzaprine  (FLEXERIL ) 10 MG tablet Take 1 tablet (10 mg total) by mouth 2  (two) times daily. Patient taking differently: Take 10 mg by mouth 3 (three) times daily as needed for muscle spasms. 10/03/23  Yes Rollene Almarie LABOR, MD  hydrochlorothiazide (HYDRODIURIL) 25 MG tablet Take 1 tablet (25 mg total) by mouth daily. 09/02/24  Yes Rollene Almarie LABOR, MD  pantoprazole  (PROTONIX ) 40 MG tablet Take 1 tablet (40 mg total) by mouth daily. 10/03/23  Yes Rollene Almarie LABOR, MD  tirzepatide [facility]) 2.5 MG/0.5ML Pen Inject 2.5 mg into the skin once a week. Patient not taking: Reported on 10/05/2024 09/02/24   Rollene Almarie LABOR, MD  tirzepatide (ZEPBOUND) 5 MG/0.5ML Pen Inject 5 mg into the skin once a week. Patient not taking: Reported on 10/05/2024 09/02/24   Rollene Almarie LABOR, MD  tirzepatide [facility]) 7.5 MG/0.5ML Pen Inject 7.5 mg into the skin once a week. Patient not taking: Reported on 10/05/2024 09/02/24   Rollene Almarie LABOR, MD    Physical Exam: Vitals:   10/05/24 1823 10/05/24 2127 10/05/24 2300 10/05/24 2309  BP: (!) 185/156 (!) 212/105 (!) 224/121 (!) 177/98  Pulse: 90 88 100   Resp: 20 (!) 22 19   Temp: 98.1 F (36.7 C) 98.2 F (36.8 C)    TempSrc: Oral Oral    SpO2: 98% 98% 98%   Weight:      Height:        Physical Exam  Labs on Admission: I have personally reviewed following labs and imaging studies  CBC: Recent Labs  Lab 10/05/24 1530  WBC 7.0  NEUTROABS 4.5  HGB 14.2  HCT 43.7  MCV 93.4  PLT 240   Basic Metabolic Panel: Recent Labs  Lab 10/05/24 1530  NA 141  K 4.4  CL 104  CO2 25  GLUCOSE 105*  BUN 14  CREATININE 1.01*  CALCIUM 10.0   GFR: Estimated Creatinine Clearance: 98.3 mL/min (A) (by C-G formula based on SCr of 1.01 mg/dL (H)). Liver Function Tests: No results for input(s): AST, ALT, ALKPHOS, BILITOT, PROT, ALBUMIN in the last 168 hours. No results for input(s): LIPASE, AMYLASE in the last 168 hours. No results for input(s): AMMONIA in the last 168 hours. Coagulation  Profile: No results for input(s): INR, PROTIME in the last 168 hours. Cardiac Enzymes: No results for input(s): CKTOTAL, CKMB, CKMBINDEX, TROPONINI in the last 168 hours. BNP (last 3 results) No results for input(s): PROBNP in the last 8760 hours. HbA1C: No results for input(s): HGBA1C in the last 72 hours. CBG: No results for input(s): GLUCAP in the last 168 hours. Lipid Profile: No results for input(s): CHOL, HDL, LDLCALC, TRIG, CHOLHDL, LDLDIRECT in the last 72 hours. Thyroid  Function Tests: No results for input(s): TSH, T4TOTAL, FREET4, T3FREE, THYROIDAB in the last 72 hours. Anemia Panel: No results for input(s): VITAMINB12, FOLATE, FERRITIN, TIBC, IRON, RETICCTPCT in the last 72 hours. Urine analysis:    Component Value Date/Time   COLORURINE YELLOW 09/02/2024 1608   APPEARANCEUR CLEAR 09/02/2024 1608   LABSPEC 1.015 09/02/2024 1608   PHURINE 6.5 09/02/2024 1608   GLUCOSEU NEGATIVE 09/02/2024 1608   HGBUR NEGATIVE 09/02/2024 1608   BILIRUBINUR NEGATIVE 09/02/2024 1608   KETONESUR NEGATIVE 09/02/2024 1608   PROTEINUR NEGATIVE 06/03/2012 2114   UROBILINOGEN 0.2 09/02/2024 1608   NITRITE NEGATIVE 09/02/2024 1608   LEUKOCYTESUR TRACE (A) 09/02/2024 1608    Radiological Exams on Admission: DG Chest Portable 1 View Result Date: 10/05/2024 CLINICAL DATA:  Central chest pain radiating to right side EXAM: PORTABLE CHEST 1 VIEW COMPARISON:  05/21/2013 FINDINGS: Single frontal view of the chest demonstrates an unremarkable cardiac silhouette. No acute airspace disease, effusion, or pneumothorax. No acute bony abnormalities. IMPRESSION: 1. No acute intrathoracic process. Electronically Signed   By: Ozell Daring M.D.   On: 10/05/2024 15:12    Assessment and Plan  Chest pain Hypertensive emergency Blood pressure 204/112 on arrival.  EKG showing sinus rhythm and no STEMI.  Troponin 58> 158.  Chest x-ray showing no acute  intrathoracic process.  Patient was given a dose of IV hydralazine after which blood pressure improved, most recent 177/98.  She is no longer having active chest pain at this time.  Cardiology recommended starting heparin drip for possible NSTEMI and felt that the patient could be admitted to [facility] long. -Cardiac monitoring -Continue heparin drip -Aspirin 324 mg given -Trend troponin -Echocardiogram -Continue home hydrochlorothiazide -IV hydralazine PRN SBP >160  Gout    DVT prophylaxis: {Blank single:19197::Lovenox,SQ Heparin,IV heparin gtt,Xarelto,Eliquis,Coumadin,SCDs,***} Code Status: {Blank single:19197::Full Code,DNR,DNR/DNI,Comfort Care,***} Family Communication: ***  Consults called: ***  Level of care: {Blank single:19197::Med-Surg,Telemetry bed,Progressive Care Unit,Step Down Unit} Admission status: *** Time Spent: 75+ minutes***  Editha Ram MD [facility]  If 7PM-7AM, please contact night-coverage [email or web address]  10/05/2024, 11:49 PM

## 2024-10-05 NOTE — Progress Notes (Signed)
 PHARMACY - ANTICOAGULATION CONSULT NOTE  Pharmacy Consult for heparin Indication: NSTEMI  Allergies  Allergen Reactions   Ivp Dye [Iodinated Contrast Media] Hives   Biaxin [Clarithromycin] Hives and Nausea And Vomiting   Iodine Hives   Lisinopril  Swelling   Vicodin [Hydrocodone -Acetaminophen ] Nausea And Vomiting    Patient Measurements: Height: 5' 5 (165.1 cm) Weight: (!) 164.7 kg (363 lb 1.6 oz) IBW/kg (Calculated) : 57 HEPARIN DW (KG): 99.3  Vital Signs: Temp: 98.1 F (36.7 C) (11/15 1823) Temp Source: Oral (11/15 1823) BP: 185/156 (11/15 1823) Pulse Rate: 90 (11/15 1823)  Labs: Recent Labs    10/05/24 1530  HGB 14.2  HCT 43.7  PLT 240  CREATININE 1.01*    Estimated Creatinine Clearance: 98.3 mL/min (A) (by C-G formula based on SCr of 1.01 mg/dL (H)).   Medical History: Past Medical History:  Diagnosis Date   Back pain    Hypertension    Knee pain      Assessment: 58 yo female presents with chest pain, pharmacy to dose heparin drip. No prior anti-coagulation noted.  CBC WNL, Scr 1.01  Goal of Therapy:  Heparin level 0.3-0.7 units/ml Monitor platelets by anticoagulation protocol: Yes   Plan:  4000 units heparin bolus x 1 Start heparin drip at 1300 units/hr Heparin level in 6-8 hours Daily CBC  Leeroy Mace RPh 10/05/2024, 9:31 PM

## 2024-10-06 ENCOUNTER — Encounter (HOSPITAL_COMMUNITY): Payer: Self-pay | Admitting: Internal Medicine

## 2024-10-06 ENCOUNTER — Observation Stay (HOSPITAL_COMMUNITY)

## 2024-10-06 ENCOUNTER — Other Ambulatory Visit: Payer: Self-pay

## 2024-10-06 DIAGNOSIS — Z743 Need for continuous supervision: Secondary | ICD-10-CM | POA: Diagnosis not present

## 2024-10-06 DIAGNOSIS — I214 Non-ST elevation (NSTEMI) myocardial infarction: Secondary | ICD-10-CM

## 2024-10-06 DIAGNOSIS — R079 Chest pain, unspecified: Secondary | ICD-10-CM | POA: Diagnosis not present

## 2024-10-06 DIAGNOSIS — I10 Essential (primary) hypertension: Secondary | ICD-10-CM

## 2024-10-06 DIAGNOSIS — R7989 Other specified abnormal findings of blood chemistry: Secondary | ICD-10-CM | POA: Diagnosis not present

## 2024-10-06 DIAGNOSIS — I161 Hypertensive emergency: Secondary | ICD-10-CM

## 2024-10-06 LAB — CBC
HCT: 39.6 % (ref 36.0–46.0)
Hemoglobin: 12.7 g/dL (ref 12.0–15.0)
MCH: 29.7 pg (ref 26.0–34.0)
MCHC: 32.1 g/dL (ref 30.0–36.0)
MCV: 92.5 fL (ref 80.0–100.0)
Platelets: 231 K/uL (ref 150–400)
RBC: 4.28 MIL/uL (ref 3.87–5.11)
RDW: 15.3 % (ref 11.5–15.5)
WBC: 6.5 K/uL (ref 4.0–10.5)
nRBC: 0 % (ref 0.0–0.2)

## 2024-10-06 LAB — TROPONIN T, HIGH SENSITIVITY
Troponin T High Sensitivity: 276 ng/L (ref 0–19)
Troponin T High Sensitivity: 282 ng/L (ref 0–19)

## 2024-10-06 LAB — ECHOCARDIOGRAM COMPLETE
Area-P 1/2: 3.2 cm2
Height: 65 in
S' Lateral: 2.4 cm
Weight: 5809.56 [oz_av]

## 2024-10-06 LAB — HEPARIN LEVEL (UNFRACTIONATED)
Heparin Unfractionated: 0.37 [IU]/mL (ref 0.30–0.70)
Heparin Unfractionated: 0.16 [IU]/mL — ABNORMAL LOW (ref 0.30–0.70)

## 2024-10-06 MED ORDER — PANTOPRAZOLE SODIUM 40 MG PO TBEC
40.0000 mg | DELAYED_RELEASE_TABLET | Freq: Every day | ORAL | Status: DC
  Administered 2024-10-06 – 2024-10-09 (×4): 40 mg via ORAL
  Filled 2024-10-06 (×5): qty 1

## 2024-10-06 MED ORDER — HYDRALAZINE HCL 20 MG/ML IJ SOLN
10.0000 mg | INTRAMUSCULAR | Status: DC | PRN
  Administered 2024-10-06: 10 mg via INTRAVENOUS
  Filled 2024-10-06: qty 1

## 2024-10-06 MED ORDER — LABETALOL HCL 5 MG/ML IV SOLN
10.0000 mg | Freq: Once | INTRAVENOUS | Status: AC
  Administered 2024-10-06: 10 mg via INTRAVENOUS
  Filled 2024-10-06: qty 4

## 2024-10-06 MED ORDER — OXYCODONE HCL 5 MG PO TABS
5.0000 mg | ORAL_TABLET | ORAL | Status: DC | PRN
  Administered 2024-10-08 – 2024-10-09 (×2): 10 mg via ORAL
  Filled 2024-10-06 (×2): qty 2

## 2024-10-06 MED ORDER — HYDRALAZINE HCL 20 MG/ML IJ SOLN
10.0000 mg | Freq: Once | INTRAMUSCULAR | Status: AC
  Administered 2024-10-06: 10 mg via INTRAVENOUS
  Filled 2024-10-06: qty 1

## 2024-10-06 MED ORDER — HEPARIN BOLUS VIA INFUSION
2900.0000 [IU] | Freq: Once | INTRAVENOUS | Status: AC
  Administered 2024-10-06: 2900 [IU] via INTRAVENOUS
  Filled 2024-10-06: qty 2900

## 2024-10-06 MED ORDER — METOPROLOL TARTRATE 25 MG PO TABS
25.0000 mg | ORAL_TABLET | Freq: Two times a day (BID) | ORAL | Status: DC
  Administered 2024-10-06: 25 mg via ORAL
  Filled 2024-10-06 (×3): qty 1

## 2024-10-06 MED ORDER — HYDRALAZINE HCL 20 MG/ML IJ SOLN
20.0000 mg | INTRAMUSCULAR | Status: DC | PRN
  Administered 2024-10-06: 20 mg via INTRAVENOUS
  Filled 2024-10-06: qty 1

## 2024-10-06 MED ORDER — LABETALOL HCL 5 MG/ML IV SOLN
10.0000 mg | INTRAVENOUS | Status: DC | PRN
  Filled 2024-10-06: qty 4

## 2024-10-06 MED ORDER — ALUM & MAG HYDROXIDE-SIMETH 200-200-20 MG/5ML PO SUSP
30.0000 mL | ORAL | Status: DC | PRN
  Administered 2024-10-06: 30 mL via ORAL
  Filled 2024-10-06: qty 30

## 2024-10-06 MED ORDER — AMLODIPINE BESYLATE 5 MG PO TABS: 10.0000 mg | ORAL_TABLET | Freq: Every day | ORAL | Status: DC

## 2024-10-06 MED ORDER — ASPIRIN 81 MG PO TBEC
81.0000 mg | DELAYED_RELEASE_TABLET | Freq: Every day | ORAL | Status: DC
  Administered 2024-10-06 – 2024-10-08 (×3): 81 mg via ORAL
  Filled 2024-10-06 (×6): qty 1

## 2024-10-06 MED ORDER — AMLODIPINE BESYLATE 5 MG PO TABS
10.0000 mg | ORAL_TABLET | Freq: Every day | ORAL | Status: DC
  Administered 2024-10-06: 10 mg via ORAL
  Filled 2024-10-06: qty 2

## 2024-10-06 MED ORDER — HYDROCHLOROTHIAZIDE 25 MG PO TABS: 25.0000 mg | ORAL_TABLET | Freq: Every day | ORAL | Status: DC

## 2024-10-06 MED ORDER — PROCHLORPERAZINE EDISYLATE 10 MG/2ML IJ SOLN: 5.0000 mg | Freq: Four times a day (QID) | INTRAMUSCULAR | Status: DC | PRN

## 2024-10-06 MED ORDER — ALLOPURINOL 300 MG PO TABS
300.0000 mg | ORAL_TABLET | Freq: Every day | ORAL | Status: DC
  Administered 2024-10-06 – 2024-10-09 (×4): 300 mg via ORAL
  Filled 2024-10-06 (×2): qty 1
  Filled 2024-10-06: qty 3
  Filled 2024-10-06: qty 1

## 2024-10-06 MED ORDER — ROSUVASTATIN CALCIUM 20 MG PO TABS
20.0000 mg | ORAL_TABLET | Freq: Every day | ORAL | Status: DC
  Administered 2024-10-06 – 2024-10-09 (×4): 20 mg via ORAL
  Filled 2024-10-06 (×5): qty 1

## 2024-10-06 MED ORDER — FREE WATER
500.0000 mL | Freq: Once | Status: AC
  Administered 2024-10-07: 500 mL via ORAL

## 2024-10-06 MED ORDER — HYDROCHLOROTHIAZIDE 25 MG PO TABS
25.0000 mg | ORAL_TABLET | Freq: Every day | ORAL | Status: DC
  Administered 2024-10-06: 25 mg via ORAL
  Filled 2024-10-06: qty 1

## 2024-10-06 MED ORDER — IRBESARTAN 75 MG PO TABS
37.5000 mg | ORAL_TABLET | Freq: Every day | ORAL | Status: DC
  Administered 2024-10-06: 37.5 mg via ORAL
  Filled 2024-10-06: qty 1
  Filled 2024-10-06: qty 0.5

## 2024-10-06 MED ORDER — METOPROLOL TARTRATE 25 MG PO TABS
25.0000 mg | ORAL_TABLET | Freq: Two times a day (BID) | ORAL | Status: DC
  Administered 2024-10-07 – 2024-10-08 (×4): 25 mg via ORAL
  Filled 2024-10-06 (×4): qty 1

## 2024-10-06 MED ORDER — ACETAMINOPHEN 325 MG PO TABS
650.0000 mg | ORAL_TABLET | Freq: Four times a day (QID) | ORAL | Status: DC | PRN
  Administered 2024-10-06 – 2024-10-09 (×3): 650 mg via ORAL
  Filled 2024-10-06 (×3): qty 2

## 2024-10-06 NOTE — ED Notes (Signed)
 Pt moved to bari bed and bedside toilet in room per provider request

## 2024-10-06 NOTE — Progress Notes (Addendum)
  Echocardiogram 2D Echocardiogram has been performed.      Katherine Browning 10/06/2024, 1:46 PM

## 2024-10-06 NOTE — Progress Notes (Signed)
 PHARMACY - ANTICOAGULATION CONSULT NOTE  Pharmacy Consult for heparin Indication: NSTEMI  Allergies  Allergen Reactions   Ivp Dye [Iodinated Contrast Media] Hives   Biaxin [Clarithromycin] Hives and Nausea And Vomiting   Iodine Hives   Lisinopril  Swelling   Vicodin [Hydrocodone -Acetaminophen ] Nausea And Vomiting    Patient Measurements: Height: 5' 5 (165.1 cm) Weight: (!) 164.7 kg (363 lb 1.6 oz) IBW/kg (Calculated) : 57 HEPARIN DW (KG): 99.3  Vital Signs: Temp: 98.3 F (36.8 C) (11/16 0115) Temp Source: Oral (11/15 2127) BP: 178/92 (11/16 0300) Pulse Rate: 82 (11/16 0300)  Labs: Recent Labs    10/05/24 1530 10/06/24 0509  HGB 14.2  --   HCT 43.7  --   PLT 240  --   HEPARINUNFRC  --  0.37  CREATININE 1.01*  --     Estimated Creatinine Clearance: 98.3 mL/min (A) (by C-G formula based on SCr of 1.01 mg/dL (H)).   Medical History: Past Medical History:  Diagnosis Date   Back pain    Hypertension    Knee pain      Assessment: 58 yo female presents with chest pain, pharmacy to dose heparin drip. No prior anti-coagulation noted.  CBC WNL, Scr 1.01  1st heparin level 0.37, therapeutic on 1300 units/hr No bleeding per RN  Goal of Therapy:  Heparin level 0.3-0.7 units/ml Monitor platelets by anticoagulation protocol: Yes   Plan:  continue heparin drip at 1300 units/hr Confirmatory heparin level in 6 hours Daily CBC  Leeroy Mace RPh 10/06/2024, 5:36 AM

## 2024-10-06 NOTE — ED Notes (Signed)
 Pt ambulatory to bathroom

## 2024-10-06 NOTE — Progress Notes (Signed)
 PHARMACY - ANTICOAGULATION CONSULT NOTE  Pharmacy Consult for heparin Indication: NSTEMI  Allergies  Allergen Reactions   Ivp Dye [Iodinated Contrast Media] Hives   Biaxin [Clarithromycin] Hives and Nausea And Vomiting   Iodine Hives   Lisinopril  Swelling   Vicodin [Hydrocodone -Acetaminophen ] Nausea And Vomiting    Patient Measurements: Height: 5' 5 (165.1 cm) Weight: (!) 164.7 kg (363 lb 1.6 oz) IBW/kg (Calculated) : 57 HEPARIN DW (KG): 99.3  Vital Signs: Temp: 98 F (36.7 C) (11/16 1203) Temp Source: Oral (11/16 1203) BP: 171/94 (11/16 1203) Pulse Rate: 89 (11/16 1203)  Labs: Recent Labs    10/05/24 1530 10/06/24 0509 10/06/24 0531 10/06/24 1308  HGB 14.2  --  12.7  --   HCT 43.7  --  39.6  --   PLT 240  --  231  --   HEPARINUNFRC  --  0.37  --  0.16*  CREATININE 1.01*  --   --   --     Estimated Creatinine Clearance: 98.3 mL/min (A) (by C-G formula based on SCr of 1.01 mg/dL (H)).   Medical History: Past Medical History:  Diagnosis Date   Back pain    Hypertension    Knee pain      Assessment: 58 yo female presents with chest pain, pharmacy to dose heparin infusion. Cardiology consulted, plan for cardiac cath. No prior anti-coagulation noted.   Today, -Repeat heparin level down to 0.16 - subtherapeutic with heparin infusing at 1300 units/hr -CBC ok -No complications of therapy noted per RN; level drawn from opposite arm from heparin infusion  Goal of Therapy:  Heparin level 0.3-0.7 units/ml Monitor platelets by anticoagulation protocol: Yes   Plan:  -Bolus heparin 2900 units -Increase heparin infusion to 1600 units/hr -Check heparin level in 6 hours -Daily CBC   Stefano MARLA Bologna, PharmD, BCPS Clinical Pharmacist 10/06/2024 1:45 PM

## 2024-10-06 NOTE — Progress Notes (Signed)
 PROGRESS NOTE    Katherine Browning  FMW:[id number] DOB: 02/23/1966 DOA: 10/05/2024 PCP: Rollene Almarie LABOR, MD   Brief Narrative: 58 year old with past medical history significant for gout, hypertension, OSA, on CPAP, insulin resistant, morbid obesity presents complaining of chest pain.  Evaluation in the ED consistent with sinus rhythm, elevation of troponin up to 200, hypertensive emergency blood pressure 204/112.  Of note patient reported that after she burped her chest pain resolved in the ED.  She missed the dose of hydrochlorothiazide for a few days.  Also clonidine  was stopped and she was started on hydrochlorothiazide.   Assessment & Plan:   Principal Problem:   Chest pain Active Problems:   Gout   GERD (gastroesophageal reflux disease)   NSTEMI (non-ST elevated myocardial infarction) [facility])   Hypertensive emergency  1-Chest pain, with elevated troponin,  non-STEMI -Plan to transfer to [facility].  -On heparin Gtt -Cardiology consulted, planning cath tomorrow.  -Check ECHO  Hypertensive emergency -Start hydrochlorothiazide, Norvasc , Metoprolol -PRN hydralazine and Labetalol.   Gout -On Allopurinol .   GERD -PPI      Estimated body mass index is 60.42 kg/m as calculated from the following:   Height as of this encounter: 5' 5 (1.651 m).   Weight as of this encounter: 164.7 kg.     Consultants:  Cardiology   Procedures:  ECHO  Antimicrobials:    Subjective: She is chest pain free. Chest pain resolved after she burp multiples times.  Her PCP discontinue clonidine  because if she forgets to takes it, it can cause rebound HTN  Objective: Vitals:   10/06/24 0000 10/06/24 0115 10/06/24 0300 10/06/24 0600  BP: (!) 182/100 (!) 188/95 (!) 178/92 (!) 217/111  Pulse: 94 94 82 80  Resp: (!) 0 16 19 19   Temp:  98.3 F (36.8 C)  98 F (36.7 C)  TempSrc:      SpO2: 97% 97% 95% 95%  Weight:      Height:       No intake or output data in the 24 hours  ending 10/06/24 0725 Filed Weights   10/05/24 1312  Weight: (!) 164.7 kg    Examination:  General exam: Appears calm and comfortable  Respiratory system: Clear to auscultation. Respiratory effort normal. Cardiovascular system: S1 & S2 heard, RRR. No JVD, murmurs, rubs, gallops or clicks. No pedal edema. Gastrointestinal system: Abdomen is nondistended, soft and nontender. No organomegaly or masses felt. Normal bowel sounds heard. Central nervous system: Alert and oriented. No focal neurological deficits. Extremities: Symmetric 5 x 5 power.  Data Reviewed: I have personally reviewed following labs and imaging studies  CBC: Recent Labs  Lab 10/05/24 1530 10/06/24 0531  WBC 7.0 6.5  NEUTROABS 4.5  --   HGB 14.2 12.7  HCT 43.7 39.6  MCV 93.4 92.5  PLT 240 231   Basic Metabolic Panel: Recent Labs  Lab 10/05/24 1530  NA 141  K 4.4  CL 104  CO2 25  GLUCOSE 105*  BUN 14  CREATININE 1.01*  CALCIUM 10.0   GFR: Estimated Creatinine Clearance: 98.3 mL/min (A) (by C-G formula based on SCr of 1.01 mg/dL (H)). Liver Function Tests: No results for input(s): AST, ALT, ALKPHOS, BILITOT, PROT, ALBUMIN in the last 168 hours. No results for input(s): LIPASE, AMYLASE in the last 168 hours. No results for input(s): AMMONIA in the last 168 hours. Coagulation Profile: No results for input(s): INR, PROTIME in the last 168 hours. Cardiac Enzymes: No results for input(s): CKTOTAL, CKMB, CKMBINDEX, TROPONINI  in the last 168 hours. BNP (last 3 results) No results for input(s): PROBNP in the last 8760 hours. HbA1C: No results for input(s): HGBA1C in the last 72 hours. CBG: No results for input(s): GLUCAP in the last 168 hours. Lipid Profile: No results for input(s): CHOL, HDL, LDLCALC, TRIG, CHOLHDL, LDLDIRECT in the last 72 hours. Thyroid  Function Tests: No results for input(s): TSH, T4TOTAL, FREET4, T3FREE, THYROIDAB in the  last 72 hours. Anemia Panel: No results for input(s): VITAMINB12, FOLATE, FERRITIN, TIBC, IRON, RETICCTPCT in the last 72 hours. Sepsis Labs: No results for input(s): PROCALCITON, LATICACIDVEN in the last 168 hours.  No results found for this or any previous visit (from the past 240 hours).       Radiology Studies: DG Chest Portable 1 View Result Date: 10/05/2024 CLINICAL DATA:  Central chest pain radiating to right side EXAM: PORTABLE CHEST 1 VIEW COMPARISON:  05/21/2013 FINDINGS: Single frontal view of the chest demonstrates an unremarkable cardiac silhouette. No acute airspace disease, effusion, or pneumothorax. No acute bony abnormalities. IMPRESSION: 1. No acute intrathoracic process. Electronically Signed   By: Ozell Daring M.D.   On: 10/05/2024 15:12        Scheduled Meds:  allopurinol   300 mg Oral Daily   amLODipine   10 mg Oral Daily   hydrALAZINE  10 mg Intravenous Once   hydrochlorothiazide  25 mg Oral Daily   pantoprazole   40 mg Oral Daily   Continuous Infusions:  heparin 1,300 Units/hr (10/05/24 2140)     LOS: 0 days    Time spent: 35 Minutes    Daylen Hack A Quanita Barona, MD [facility]   If 7PM-7AM, please contact night-coverage [email or web address]  10/06/2024, 7:25 AM

## 2024-10-06 NOTE — Progress Notes (Signed)
 Pt defers signing consent at this time for heart catheterization tomorrow 2/2 wanting to speak with provider more about procedure. Pt didn't endorse any particular questions. Will notify provider on-call and update Day RN in AM.

## 2024-10-06 NOTE — Progress Notes (Signed)
 Providers paged twice to update about pt being transferred & arriving to [facility] 6e28. No new orders given & pt requesting something to eat or drink. New order for heart healthy diet placed awaiting a co sign as pt is now c/o a h/a.   [facility] called as pt was supposed to get medications at 1000 am including a heparin bolus of 2900 u around 1400 that wasn't given.  Upon arrived to [facility] from [facility] around 1600.  Upon speaking with charge nurse as well as new rn from [facility] it was established that although meds were pulled from the pyxis the pt did not receive the meds as the previous nurse fell ill. Pt did arrive with  with her heparin gtt at 13. The rate was increased to 16 & 2900 u bolus was given around 1644. Laguana Desautel R, RN

## 2024-10-06 NOTE — Consult Note (Signed)
 Cardiology Consultation:   Patient ID: Katherine Browning; [id number]; March 07, 1966   Admit date: 10/05/2024 Date of Consult: 10/06/2024  Primary Care Provider: Rollene Almarie LABOR, MD Primary Cardiologist: None  Primary Electrophysiologist:  None   Patient Profile:   Katherine Browning is a 58 y.o. female with a hx of hypertension and sleep apnea who is being seen today for the evaluation of chest discomfort at the request of [place].  History of Present Illness:   Ms. Backs has no history of coronary artery disease but she does have significant cardiovascular risk factors.  The patient took a nap and when she woke up she had chest discomfort.  This was described as a sharp discomfort on the right side.  She had gas before and usually can get rid of it by burping.  She took some Rolaids that she did not have helped with his discomfort.  It was not going away.  There was some radiation to the right shoulder.  She was not nauseated.  She did not have vomiting.  She was not particularly short of breath.  It was intense however.  She has not had pain exactly like this before.  It was not positional.  [facility] was called.  It does not look like there were any meds administered in the field and she did not have acute EKG changes.  In the emergency room she has been very hypertensive.  She says she has not been usually this hypertensive in doctors offices but she did not take her blood pressure at home.  She has had manipulations recently of her blood pressure meds and is currently on HCTZ only.  She had some intolerance to Norvasc  which was headaches.  She also would not take her clonidine  routinely so was taken off of this.  In the emergency room  troponin did trend upward starting at 58 and peaking at 282.  She was started on heparin.  She has required IV hydralazine and labetalol because her blood pressures been elevated.  Past Medical History:  Diagnosis Date   Back pain    Hypertension    Knee  pain     Past Surgical History:  Procedure Laterality Date   ECTOPIC PREGNANCY SURGERY       Home Medications:  Prior to Admission medications   Medication Sig Start Date End Date Taking? Authorizing Provider  allopurinol  (ZYLOPRIM ) 300 MG tablet Take 1 tablet (300 mg total) by mouth daily. 10/03/23  Yes Rollene Almarie LABOR, MD  colchicine  0.6 MG tablet TAKE 1 TABLET(0.6 MG) BY MOUTH DAILY AS NEEDED 05/17/24  Yes Rollene Almarie LABOR, MD  cyclobenzaprine  (FLEXERIL ) 10 MG tablet Take 1 tablet (10 mg total) by mouth 2 (two) times daily. Patient taking differently: Take 10 mg by mouth 3 (three) times daily as needed for muscle spasms. 10/03/23  Yes Rollene Almarie LABOR, MD  hydrochlorothiazide (HYDRODIURIL) 25 MG tablet Take 1 tablet (25 mg total) by mouth daily. 09/02/24  Yes Rollene Almarie LABOR, MD  pantoprazole  (PROTONIX ) 40 MG tablet Take 1 tablet (40 mg total) by mouth daily. 10/03/23  Yes Rollene Almarie LABOR, MD  tirzepatide [facility]) 2.5 MG/0.5ML Pen Inject 2.5 mg into the skin once a week. Patient not taking: Reported on 10/05/2024 09/02/24   Rollene Almarie LABOR, MD  tirzepatide (ZEPBOUND) 5 MG/0.5ML Pen Inject 5 mg into the skin once a week. Patient not taking: Reported on 10/05/2024 09/02/24   Rollene Almarie LABOR, MD  tirzepatide [facility]) 7.5 MG/0.5ML Pen Inject 7.5 mg  into the skin once a week. Patient not taking: Reported on 10/05/2024 09/02/24   Rollene Almarie LABOR, MD    Inpatient Medications: Scheduled Meds:  allopurinol   300 mg Oral Daily   amLODipine   10 mg Oral Daily   hydrALAZINE  10 mg Intravenous Once   hydrochlorothiazide  25 mg Oral Daily   pantoprazole   40 mg Oral Daily   Continuous Infusions:  heparin 1,300 Units/hr (10/05/24 2140)   PRN Meds: hydrALAZINE, labetalol  Allergies:    Allergies  Allergen Reactions   Ivp Dye [Iodinated Contrast Media] Hives   Biaxin [Clarithromycin] Hives and Nausea And Vomiting   Iodine Hives   Lisinopril   Swelling   Vicodin [Hydrocodone -Acetaminophen ] Nausea And Vomiting    Social History:   Social History   Socioeconomic History   Marital status: Widowed    Spouse name: Not on file   Number of children: Not on file   Years of education: Not on file   Highest education level: Not on file  Occupational History   Not on file  Tobacco Use   Smoking status: Never   Smokeless tobacco: Never  Substance and Sexual Activity   Alcohol use: No    Comment: occ   Drug use: No   Sexual activity: Not on file  Other Topics Concern   Not on file  Social History Narrative   Not on file   Social Drivers of Health   Financial Resource Strain: Not on file  Food Insecurity: Not on file  Transportation Needs: Not on file  Physical Activity: Not on file  Stress: Not on file  Social Connections: Not on file  Intimate Partner Violence: Not on file    Family History:   She does not really know very much about her family history but it does not seem like there is early coronary disease. Family History  Problem Relation Age of Onset   Hypertension Other      ROS:  Please see the history of present illness.  Positive for sciatica and snoring  Physical Exam/Data:   Vitals:   10/06/24 0000 10/06/24 0115 10/06/24 0300 10/06/24 0600  BP: (!) 182/100 (!) 188/95 (!) 178/92 (!) 217/111  Pulse: 94 94 82 80  Resp: (!) 0 16 19 19   Temp:  98.3 F (36.8 C)  98 F (36.7 C)  TempSrc:      SpO2: 97% 97% 95% 95%  Weight:      Height:       No intake or output data in the 24 hours ending 10/06/24 0751 Filed Weights   10/05/24 1312  Weight: (!) 164.7 kg   Body mass index is 60.42 kg/m.  GENERAL: Mildly uncomfortable appearing appearing HEENT:   Pupils equal round and reactive, fundi not visualized, oral mucosa unremarkable NECK:  No  jugular venous distention, waveform within normal limits, carotid upstroke brisk and symmetric, no bruits, no thyromegaly LYMPHATICS:  No cervical, inguinal  adenopathy LUNGS:   Clear to auscultation bilaterally BACK:  No CVA tenderness CHEST:   Unremarkable HEART:  PMI not displaced or sustained,S1 and S2 within normal limits, no S3, no S4, no clicks, no rubs, no murmurs ABD:  Flat, positive bowel sounds normal in frequency in pitch, no bruits, no rebound, no guarding, no midline pulsatile mass, no hepatomegaly, no splenomegaly EXT:  2 plus pulses throughout, no  edema, no cyanosis no clubbing SKIN:  No rashes no nodules NEURO:   Cranial nerves II through XII grossly intact, motor grossly intact  throughout [facility]:    Cognitively intact, oriented to person place and time   EKG:  The EKG was personally reviewed and demonstrates: Normal sinus rhythm, rate 96, axis within normal limits, intervals within normal limits, no acute ST-T wave changes.  Telemetry:  Telemetry was personally reviewed and demonstrates:  NSR  Relevant CV Studies: None  Laboratory Data:  Chemistry Recent Labs  Lab 10/05/24 1530  NA 141  K 4.4  CL 104  CO2 25  GLUCOSE 105*  BUN 14  CREATININE 1.01*  CALCIUM 10.0  GFRNONAA >60  ANIONGAP 12    No results for input(s): PROT, ALBUMIN, AST, ALT, ALKPHOS, BILITOT in the last 168 hours. Hematology Recent Labs  Lab 10/05/24 1530 10/06/24 0531  WBC 7.0 6.5  RBC 4.68 4.28  HGB 14.2 12.7  HCT 43.7 39.6  MCV 93.4 92.5  MCH 30.3 29.7  MCHC 32.5 32.1  RDW 15.1 15.3  PLT 240 231   Cardiac EnzymesNo results for input(s): TROPONINI in the last 168 hours. No results for input(s): TROPIPOC in the last 168 hours.  BNPNo results for input(s): BNP, PROBNP in the last 168 hours.  DDimer No results for input(s): DDIMER in the last 168 hours.  Radiology/Studies:  DG Chest Portable 1 View Result Date: 10/05/2024 CLINICAL DATA:  Central chest pain radiating to right side EXAM: PORTABLE CHEST 1 VIEW COMPARISON:  05/21/2013 FINDINGS: Single frontal view of the chest demonstrates an unremarkable cardiac  silhouette. No acute airspace disease, effusion, or pneumothorax. No acute bony abnormalities. IMPRESSION: 1. No acute intrathoracic process. Electronically Signed   By: Ozell Daring M.D.   On: 10/05/2024 15:12    Assessment and Plan:    NSTEMI: Continue heparin, aspirin.  Check an echocardiogram.  Cardiac cath will be indicated. The patient understands that risks included but are not limited to stroke (1 in 1000), death (1 in 1000), kidney failure [usually temporary] (1 in 500), bleeding (1 in 200), allergic reaction [possibly serious] (1 in 200).  The patient understands and agrees to proceed.    Hypertension: Her blood pressure has not been well-controlled since presentation.  I talked about this with her family.  She will need a large blood pressure cuff at home to monitor this.  I am going to start ARB and beta-blocker.  I will discontinue the HCTZ.  We can use hydralazine as needed.  Risk reduction: She says she has not been diagnosed with diabetes.  Most recent A1c 6.1  Sleep apnea: She has not been able for CPAP.  Morbid obesity: Start Zepbound after her hospitalization.  She had the prescription written but she has not yet picked it up.  Dyslipidemia: LDL was 111 last month.  Start rosuvastatin this admission.  For questions or updates, please contact [facility] Please consult [email or web address] for contact info under Cardiology/STEMI.   Signed, Lynwood Schilling, MD  10/06/2024 7:51 AM

## 2024-10-07 DIAGNOSIS — R079 Chest pain, unspecified: Secondary | ICD-10-CM | POA: Diagnosis not present

## 2024-10-07 DIAGNOSIS — I214 Non-ST elevation (NSTEMI) myocardial infarction: Secondary | ICD-10-CM | POA: Diagnosis not present

## 2024-10-07 LAB — HEPARIN LEVEL (UNFRACTIONATED)
Heparin Unfractionated: 0.28 [IU]/mL — ABNORMAL LOW (ref 0.30–0.70)
Heparin Unfractionated: 0.31 [IU]/mL (ref 0.30–0.70)

## 2024-10-07 LAB — BASIC METABOLIC PANEL WITH GFR
Anion gap: 12 (ref 5–15)
BUN: 16 mg/dL (ref 6–20)
CO2: 23 mmol/L (ref 22–32)
Calcium: 9.4 mg/dL (ref 8.9–10.3)
Chloride: 99 mmol/L (ref 98–111)
Creatinine, Ser: 1.43 mg/dL — ABNORMAL HIGH (ref 0.44–1.00)
GFR, Estimated: 43 mL/min — ABNORMAL LOW (ref >60)
Glucose, Bld: 114 mg/dL — ABNORMAL HIGH (ref 70–99)
Potassium: 4 mmol/L (ref 3.5–5.1)
Sodium: 134 mmol/L — ABNORMAL LOW (ref 135–145)

## 2024-10-07 LAB — CBC
HCT: 40.6 % (ref 36.0–46.0)
Hemoglobin: 13.5 g/dL (ref 12.0–15.0)
MCH: 29.9 pg (ref 26.0–34.0)
MCHC: 33.3 g/dL (ref 30.0–36.0)
MCV: 89.8 fL (ref 80.0–100.0)
Platelets: 242 K/uL (ref 150–400)
RBC: 4.52 MIL/uL (ref 3.87–5.11)
RDW: 15.5 % (ref 11.5–15.5)
WBC: 7.4 K/uL (ref 4.0–10.5)
nRBC: 0 % (ref 0.0–0.2)

## 2024-10-07 MED ORDER — DIPHENHYDRAMINE HCL 50 MG/ML IJ SOLN: 25.0000 mg | Freq: Once | INTRAMUSCULAR | Status: DC

## 2024-10-07 MED ORDER — METHYLPREDNISOLONE SODIUM SUCC 125 MG IJ SOLR: 125.0000 mg | Freq: Once | INTRAMUSCULAR | Status: DC

## 2024-10-07 MED ORDER — METHYLPREDNISOLONE SODIUM SUCC 125 MG IJ SOLR
125.0000 mg | Freq: Once | INTRAMUSCULAR | Status: AC
  Administered 2024-10-08: 125 mg via INTRAVENOUS
  Filled 2024-10-07: qty 2

## 2024-10-07 MED ORDER — DIPHENHYDRAMINE HCL 50 MG/ML IJ SOLN
25.0000 mg | Freq: Once | INTRAMUSCULAR | Status: AC
  Administered 2024-10-08: 25 mg via INTRAVENOUS
  Filled 2024-10-07: qty 1

## 2024-10-07 NOTE — Progress Notes (Signed)
 PHARMACY - ANTICOAGULATION  Pharmacy Consult for heparin Indication: NSTEMI Brief A/P: Heparin level within goal range Continue Heparin at current rate   Allergies  Allergen Reactions   Ivp Dye [Iodinated Contrast Media] Hives   Biaxin [Clarithromycin] Hives and Nausea And Vomiting   Iodine Hives   Lisinopril  Swelling   Vicodin [Hydrocodone -Acetaminophen ] Nausea And Vomiting    Patient Measurements: Height: 5' 5 (165.1 cm) Weight: (!) 159.2 kg (351 lb) IBW/kg (Calculated) : 57 HEPARIN DW (KG): 97.6  Vital Signs: Temp: 98.6 F (37 C) (11/16 2335) Temp Source: Oral (11/16 2335) BP: 108/59 (11/16 2335) Pulse Rate: 77 (11/16 2335)  Labs: Recent Labs    10/05/24 1530 10/06/24 0509 10/06/24 0531 10/06/24 1308 10/07/24 0006  HGB 14.2  --  12.7  --   --   HCT 43.7  --  39.6  --   --   PLT 240  --  231  --   --   HEPARINUNFRC  --  0.37  --  0.16* 0.31  CREATININE 1.01*  --   --   --   --     Estimated Creatinine Clearance: 96.1 mL/min (A) (by C-G formula based on SCr of 1.01 mg/dL (H)).  Assessment: 58 y.o. female with chest pain for heparin   Goal of Therapy:  Heparin level 0.3-0.7 units/ml Monitor platelets by anticoagulation protocol: Yes   Plan:  No change to heparin   Milee Qualls, Cordella Misty, PharmD, BCPS Clinical Pharmacist 10/07/2024 12:40 AM

## 2024-10-07 NOTE — Progress Notes (Signed)
 PROGRESS NOTE    [name]  FMW:[id number] DOB: [birth year] DOA: 10/05/2024 PCP: Rollene Almarie LABOR, MD   Brief Narrative:  This 58 year old Female with PMH significant for gout, hypertension, OSA on CPAP, insulin resistant, morbid obesity presents in the ED complaining of chest pain.  Evaluation in the ED consistent with sinus rhythm, elevation of troponin up to 200, hypertensive emergency with  blood pressure 204/112.  Of note patient reported that after she burped her chest pain resolved in the ED. She missed the dose of hydrochlorothiazide for a few days.  Also clonidine  was stopped and she was started on hydrochlorothiazide. Patient was admitted for NSTEMI, started on heparin, She will need LHC. Cardiology is following.  Assessment & Plan:   Principal Problem:   Chest pain Active Problems:   Gout   GERD (gastroesophageal reflux disease)   NSTEMI (non-ST elevated myocardial infarction) (HCC)   Hypertensive emergency   Chest pain, with elevated troponin:  NSTEMI: Patient presented with chest pain , now improved. Continue heparin gtt , GII DD. Cardiology consulted, planning for LHC,once renal functions improve. Echo showed LVEF 65-70% Continue ASA, heparin gtt, Crestor 20 mg. Coreg 6.25 mg bid, Start Hydralazine 25 mg TID. Hold ARB due to AKI.   Hypertensive emergency: Continue Norvasc , Coreg, stop metoprolol. -PRN hydralazine and Labetalol.    Gout: Continue Allopurinol .    GERD: Continue PPI.  AKI : Baseline serum creatinine normal. Serum creatinine slightly up.1.43. Avoid nephrotoxic medications.   Continue IV hydration.   DVT prophylaxis: Heparin gtt Code Status: (Full code) Family Communication: No Family at bed side Disposition Plan:    Admitted for NSTEMI, awaiting LHC once renal functions improve.   Consultants:  Cardiology  Procedures: Echo  Antimicrobials:  Anti-infectives (From admission, onward)    None      Subjective: Patient  was seen and examined at bedside.  Overnight events noted. Patient denies any chest pain.  She was supposed to have left heart cath but is on hold due to elevated renal functions.   Objective: Vitals:   10/06/24 2335 10/07/24 0445 10/07/24 0941 10/07/24 1310  BP: (!) 108/59 128/79 118/80 124/87  Pulse: 77 79 75 69  Resp: 19 16 17 20   Temp: 98.6 F (37 C) 98.7 F (37.1 C) 98.3 F (36.8 C) 98.7 F (37.1 C)  TempSrc: Oral Oral Oral Oral  SpO2:  96% 97% 94%  Weight:      Height:        Intake/Output Summary (Last 24 hours) at 10/07/2024 1748 Last data filed at 10/07/2024 1310 Gross per 24 hour  Intake 2045.64 ml  Output --  Net 2045.64 ml   Filed Weights   10/05/24 1312 10/06/24 1612  Weight: (!) 164.7 kg (!) 159.2 kg    Examination:  General exam: Appears calm and comfortable , Morbidly obese. Respiratory system: Clear to auscultation. Respiratory effort normal. RR 15 Cardiovascular system: S1 & S2 heard, RRR. No JVD, murmurs, rubs, gallops or clicks. Gastrointestinal system: Abdomen is nondistended, soft and nontender. No organomegaly or masses felt. Normal bowel sounds heard. Central nervous system: Alert and oriented x 3. No focal neurological deficits. Extremities: Symmetric 5 x 5 power. Skin: No rashes, lesions or ulcers Psychiatry: Judgement and insight appear normal. Mood & affect appropriate.     Data Reviewed: I have personally reviewed following labs and imaging studies  CBC: Recent Labs  Lab 10/05/24 1530 10/06/24 0531 10/07/24 0545  WBC 7.0 6.5 7.4  NEUTROABS 4.5  --   --  HGB 14.2 12.7 13.5  HCT 43.7 39.6 40.6  MCV 93.4 92.5 89.8  PLT 240 231 242   Basic Metabolic Panel: Recent Labs  Lab 10/05/24 1530 10/07/24 0545  NA 141 134*  K 4.4 4.0  CL 104 99  CO2 25 23  GLUCOSE 105* 114*  BUN 14 16  CREATININE 1.01* 1.43*  CALCIUM 10.0 9.4   GFR: Estimated Creatinine Clearance: 67.9 mL/min (A) (by C-G formula based on SCr of 1.43 mg/dL  (H)). Liver Function Tests: No results for input(s): AST, ALT, ALKPHOS, BILITOT, PROT, ALBUMIN in the last 168 hours. No results for input(s): LIPASE, AMYLASE in the last 168 hours. No results for input(s): AMMONIA in the last 168 hours. Coagulation Profile: No results for input(s): INR, PROTIME in the last 168 hours. Cardiac Enzymes: No results for input(s): CKTOTAL, CKMB, CKMBINDEX, TROPONINI in the last 168 hours. BNP (last 3 results) No results for input(s): PROBNP in the last 8760 hours. HbA1C: No results for input(s): HGBA1C in the last 72 hours. CBG: No results for input(s): GLUCAP in the last 168 hours. Lipid Profile: No results for input(s): CHOL, HDL, LDLCALC, TRIG, CHOLHDL, LDLDIRECT in the last 72 hours. Thyroid  Function Tests: No results for input(s): TSH, T4TOTAL, FREET4, T3FREE, THYROIDAB in the last 72 hours. Anemia Panel: No results for input(s): VITAMINB12, FOLATE, FERRITIN, TIBC, IRON, RETICCTPCT in the last 72 hours. Sepsis Labs: No results for input(s): PROCALCITON, LATICACIDVEN in the last 168 hours.  No results found for this or any previous visit (from the past 240 hours).   Radiology Studies: ECHOCARDIOGRAM COMPLETE Result Date: 10/06/2024    ECHOCARDIOGRAM REPORT   Patient Name:   Katherine Browning Date of Exam: 10/06/2024 Medical Rec #:  [id number]     Height:       65.0 in Accession #:    [id number]    Weight:       363.1 lb Date of Birth:  22-Oct-1966     BSA:          2.549 m Patient Age:    58 years      BP:           163/84 mmHg Patient Gender: F             HR:           74 bpm. Exam Location:  Inpatient Procedure: 2D Echo and Strain Analysis (Both Spectral and Color Flow Doppler            were utilized during procedure). Indications:    Elevated Troponin  History:        Patient has no prior history of Echocardiogram examinations.  Sonographer:    Charmaine Gaskins Referring Phys: [id number]  [id number] RATHORE  Sonographer Comments: Image acquisition challenging due to patient body habitus. IMPRESSIONS  1. Left ventricular ejection fraction, by estimation, is 65 to 70%. The left ventricle has normal function. The left ventricle has no regional wall motion abnormalities. There is mild left ventricular hypertrophy. Left ventricular diastolic parameters are consistent with Grade II diastolic dysfunction (pseudonormalization). Elevated left atrial pressure. The average left ventricular global longitudinal strain is -21.5 %. The global longitudinal strain is normal.  2. Right ventricular systolic function is normal. The right ventricular size is normal. Tricuspid regurgitation signal is inadequate for assessing PA pressure.  3. Left atrial size was moderately dilated.  4. The mitral valve is degenerative. Trivial mitral valve regurgitation. Mild mitral stenosis. The mean mitral valve gradient is 5.0 mmHg with  average heart rate of 91 bpm. Severe mitral annular calcification.  5. The aortic valve was not well visualized. Aortic valve regurgitation is not visualized. No aortic stenosis is present. FINDINGS  Left Ventricle: Left ventricular ejection fraction, by estimation, is 65 to 70%. The left ventricle has normal function. The left ventricle has no regional wall motion abnormalities. The average left ventricular global longitudinal strain is -21.5 %. Strain was performed and the global longitudinal strain is normal. The left ventricular internal cavity size was normal in size. There is mild left ventricular hypertrophy. Left ventricular diastolic parameters are consistent with Grade II diastolic dysfunction (pseudonormalization). Elevated left atrial pressure. Right Ventricle: The right ventricular size is normal. Right vetricular wall thickness was not well visualized. Right ventricular systolic function is normal. Tricuspid regurgitation signal is inadequate for assessing PA pressure. Left Atrium: Left  atrial size was moderately dilated. Right Atrium: Right atrial size was normal in size. Pericardium: There is no evidence of pericardial effusion. Mitral Valve: The mitral valve is degenerative in appearance. Severe mitral annular calcification. Trivial mitral valve regurgitation. Mild mitral valve stenosis. MV peak gradient, 10.6 mmHg. The mean mitral valve gradient is 5.0 mmHg with average heart rate of 91 bpm. Tricuspid Valve: The tricuspid valve is normal in structure. Tricuspid valve regurgitation is trivial. Aortic Valve: The aortic valve was not well visualized. Aortic valve regurgitation is not visualized. No aortic stenosis is present. Pulmonic Valve: The pulmonic valve was not well visualized. Pulmonic valve regurgitation is not visualized. Aorta: The aortic root and ascending aorta are structurally normal, with no evidence of dilitation. IAS/Shunts: The interatrial septum was not well visualized.  LEFT VENTRICLE PLAX 2D LVIDd:         4.20 cm   Diastology LVIDs:         2.40 cm   LV e' medial:    6.09 cm/s LV PW:         1.30 cm   LV E/e' medial:  20.0 LV IVS:        1.20 cm   LV e' lateral:   6.53 cm/s LVOT diam:     2.20 cm   LV E/e' lateral: 18.7 LVOT Area:     3.80 cm                          2D Longitudinal Strain                          2D Strain GLS Avg:     -21.5 % RIGHT VENTRICLE RV Basal diam:  2.80 cm RV Mid diam:    2.50 cm TAPSE (M-mode): 3.0 cm LEFT ATRIUM              Index        RIGHT ATRIUM           Index LA diam:        4.30 cm  1.69 cm/m   RA Area:     11.00 cm LA Vol (A2C):   105.0 ml 41.20 ml/m  RA Volume:   20.20 ml  7.93 ml/m LA Vol (A4C):   97.5 ml  38.25 ml/m LA Biplane Vol: 106.0 ml 41.59 ml/m   AORTA Ao Asc diam: 3.50 cm MITRAL VALVE MV Area (PHT): 3.20 cm     SHUNTS MV Peak grad:  10.6 mmHg    Systemic Diam: 2.20 cm MV Mean grad:  5.0 mmHg  MV Vmax:       1.63 m/s MV Vmean:      105.0 cm/s MV Decel Time: 237 msec MV E velocity: 122.00 cm/s MV A velocity: 138.00 cm/s  MV E/A ratio:  0.88 Lonni Nanas MD Electronically signed by Lonni Nanas MD Signature Date/Time: 10/06/2024/4:13:34 PM    Final    Scheduled Meds:  allopurinol   300 mg Oral Daily   aspirin EC  81 mg Oral Daily   [START ON 10/08/2024] diphenhydrAMINE  25 mg Intravenous Once   [START ON 10/08/2024] methylPREDNISolone  sodium succinate  125 mg Intravenous Once   metoprolol tartrate  25 mg Oral BID   pantoprazole   40 mg Oral Daily   rosuvastatin  20 mg Oral Daily   Continuous Infusions:  heparin 1,750 Units/hr (10/07/24 0729)     LOS: 0 days    Time spent: 50 mins    Darcel Dawley, MD [facility]   If 7PM-7AM, please contact night-coverage

## 2024-10-07 NOTE — Plan of Care (Signed)
  Problem: Education: Goal: Knowledge of General Education information will improve Description: Including pain rating scale, medication(s)/side effects and non-pharmacologic comfort measures Outcome: Progressing   Problem: Education: Goal: Understanding of CV disease, CV risk reduction, and recovery process will improve Outcome: Progressing   

## 2024-10-07 NOTE — Progress Notes (Signed)
  Progress Note  Patient Name: Katherine Browning Date of Encounter: 10/07/2024 [facility] Cardiologist: None   Interval Summary   No events overnight. Denies CP or dyspnea. Concerned about contrast allergy and we discussed that we have a protocol in place for coronary angiography.   Vital Signs Vitals:   10/06/24 1903 10/06/24 2000 10/06/24 2335 10/07/24 0445  BP: (!) 155/80 134/82 (!) 108/59 128/79  Pulse:   77 79  Resp:  20 19 16   Temp:  98.6 F (37 C) 98.6 F (37 C) 98.7 F (37.1 C)  TempSrc:  Oral Oral Oral  SpO2:  92%  96%  Weight:      Height:        Intake/Output Summary (Last 24 hours) at 10/07/2024 0736 Last data filed at 10/07/2024 0600 Gross per 24 hour  Intake 1382.9 ml  Output --  Net 1382.9 ml      10/06/2024    4:12 PM 10/05/2024    1:12 PM 09/02/2024    3:36 PM  Last 3 Weights  Weight (lbs) 351 lb 363 lb 1.6 oz 363 lb  Weight (kg) 159.213 kg 164.7 kg 164.656 kg      Telemetry/ECG  NSR - Personally Reviewed  Physical Exam  GEN: No acute distress.   Neck: No JVD Cardiac: RRR, no murmurs, rubs, or gallops.  Respiratory: Clear to auscultation bilaterally. GI: Soft, nontender, non-distended  MS: No edema  Assessment & Plan  Ms Palladino is a 16 yoM with Hx of HTN and obesity who presented with CP, troponin elevation (up to 282) and BP 204/112 (in setting of missing HTZ for a couple of days), and admitted for NSTEMI and BP control.  #NSTEMI #HTN emergency #Severe MAC - Asymptomatic. Will hold LHC for now given new AKI - TTE with EF 65-70%, GIIDD and severe MAC - Cont ASA, heparin gtt, and rosuvastatin 20 - Switch metop to coreg 6.25 BID, start hydralazine 25 mg TID - Hold ARB given AKI - Check Lpa - Unclear as to the etiology of her severe MAC as such a young age; no Hx of CKD or hypercalcemia - We will continue to follow  For questions or updates, please contact [facility] Please consult [email or web address] for contact info  under    Signed, [name] Ren Donley, MD

## 2024-10-07 NOTE — Discharge Instructions (Signed)
 [place] [facility] Address: [address] [place], [place] [address] Phone Number: [phone] Hours of Operation: Residents of [place] can come to obtain food Monday through Friday from 8:30am until 3:30pm. Photo ID and Social Security cards required for all residents of a household. Can come six times a year  [facility] Address: [address] [place], [place] [address] Phone Number: [phone] Hours of Operation: Operates Tuesday-Friday 10:00 a.m. to 1 p.m. Requirements: Referral from DSS needed. May come 6 times a year, 30 days apart. Photo ID and SS required for all residents of household.  [facility] Address: [address] [place], [place] [address] Phone Number: [phone] Hours of Operation: Food pantry is open on the last Saturday of each month from 10:00 am - 12:00 noon. No appointment needed. No qualifications.  [facility] Address: [address] [place], [place] [address] Phone Number: [phone]. [phone] Hours of Operation: Must make reservations to pick up food on Saturdays. Sign ups for Saturday pick up beginning at 8:30 a.m. on Monday morning.  [facility] Address: [address] [place], [place] [address] Phone Number: [phone] Hours of Operation: If you need food, bring proper identification such as a driver's license to receive a bag of food once a month. Requirements: Can come once every 30 days with referral DSS, Holiday representative, Mental health etc. Each referral good for six visits. Photo ID required. *1st visit no referral required.  [facility] Address: [address], [place] [address] Phone Number: [phone]  [facility] Address: [address] [place], [place] [address] Phone Number: [phone] Hours of Operation:  You can register at [email or web address] for free groceries  FREE INDEED FOOD  PANTRY Address: [address], [place] [address] Phone Number: [phone] Hours of Operation: Drive through giveaway, first come first served. Every 3rd Saturday 11AM - 1PM  [facility] [address] Address: [address], [place] [address] Phone Number: [phone]   [place]  [facility] Address: [address] [place], [place] [address] Phone Number: [phone] Hours of Operation: Once a month every 3rd Saturday  [facility] Address: [address] [place], [place] [address] Phone Number: [phone] Hours of Operation: Distribution happens from 9:00-10:00 a.m. every Saturday.     [facility] Address: [address] [place], [place] [address] Phone Number: [phone] Hours of Operation: ONCE a week for the community food distribution held every Tuesday, Wednesday and Thursday from 11 a.m. - 2:00 p.m. Food is available on a first come, first serve basis and varies week to week. No appointment necessary for drive thru pick up.  [facility] Address: [address] [place], [place] [address] Phone Number: [phone] Hours of Operation: Open every 3rd Thursday 9:30 a.m. - 11:00 a.m.  [facility] Address: [address] [place], [place] [address] Phone Number: [phone] Hours of Operation: Please call for hours, directions, and questions  [facility] [place] [facility] Address: [address], [place], [place]  [address] Phone Number: [phone] Website: https://www.Hollyguns.co.za Food Finder app: https://findfood.ghpfa.org  [employer] Address: [address] [place], [place] [address] Phone Number: [phone] Hours of Operation: Contact Bree Harpe. Enrolled Substance Abuse Clients Only  [facility] Address: [address] [place], [address]  Phone Number: [phone] Hours of Operation: Contact Alene Ana. Food pantry open the 3rd Saturday of each month  from 9 a.m. -12 p.m. only  [place] [facility] Address: [address] [place], [place] [address] Phone Number: [phone] Hours of Operation: Contact Loletta Ripple. Emergency food bank open on Saturdays by appointment only  [facility] Address: [address] [place], [place] [address] Phone Number: [phone] Hours of Operation: No specific contact person; Anyone can help  [facility] Address: [address] [place], [place] [address] Phone Number: [phone] Hours of Operation: Contact Julia Oats. Agency gives out a bag of food every Thursday from 2-4 p.m. only, and also provides a community meal every Thursday between 5-6 p.m. Other services provided include rent/mortgage and utility assistance, women's winter shelter, thrift store, and senior adult activities.  [facility] [place] Address: [address] [place], [place] [address] Phone Number: [phone] Hours of Operation: The Emergency Food Assistance Program provides individuals and families with a generous supply of food including meat, fresh vegetables, and nonperishable items. The food box contains five days' worth of food, and each family or individual can receive a box once per month. M, W, Th, Fr 11am-2pm, walk-ins welcome.  [facility] Address: [address] [place], [place] [address]  Phone Number: [phone] Hours of Operation: Contact Asia Blanca Bunch. Tuesdays and Thursdays from 11am - 3pm by appointment only  Sunday, by APPOINTMENT ONLY  [address] of [place], [address] [place], [address], [phone], 3.2 mi from [facility], call in advance for appointment at 10:00am or at 4:00pm, must provide valid photo ID  Monday  9:30am-5:00pm [facility], [address] [place] [id number], [phone], 0.9 mi from [facility], can come four times per year, bring your photo ID and SS cards for other  residents of household, will make appointments for those who work and need to come after 5pm  10:00am-12:00noon [employer], [address]  [place], [address], [phone], 1.7 mi from [facility], can come once every 60 days per household, need referral from DSS, [employer] Global, etc., bring photo ID and SS card   10:00am-1:00pm [employer] [facility],  [address] [place], [address], [phone], 7.7 mi from [facility], can come once every thirty days with a referral from DSS, [employer], Mental Health, etc. -- each referral good for six visits, bring photo ID   10:00am-1:00pm [address], [address], [address], [phone], 4.2 mi from [facility], can come once every 6 months, open to [facility] residents, bring photo ID and copy of a current utility bill in your name, please call first to verify that food is available  6:30pm-8:30pm [facility], [address], [address], [phone], 3.2 mi from [facility], can come once every 30 days, maximum 6 times per year, bring your photo ID and SS numbers for other residents of household  Monday by APPOINTMENT ONLY  [facility], [address] [place], [address],  [phone], 2.5 mi from [facility], call in advance for appointment between 10:00am-2:00pm, bring your photo ID and SS cards for all residents of household, can come once every 3 months  [facility], [address], [address], [phone], 0.7 mi from [facility], call in advance for appointment, can come once every 30 days, bring your photo ID and SS cards for other residents of  household   Tuesday  9:00am-12:00noon [employer], [address] [address], [phone], 1.3 mi from [facility], can come once every 3 months, bring your photo ID and SS numbers for other residents of household   9:00am-1:00pm [facility] [address]   [place], [address], [phone], 1.6 mi from [facility], can come once every two weeks  9:30am-5:00pm [employer], [address] [place] [phone], [phone], 0.9 mi from [facility], can come four times per year, bring your photo ID and SS cards for other residents of household, will make appointments for those who work and need to come after 5pm  10:00am-12:00noon [employer], [address]  [place], [address], [phone], 1.7 mi from [facility], can come once every 60 days per household, need referral from DSS, [employer], etc., bring photo ID and SS card  10:00am-1:00pm [facility], [id number],  [phone], 3.8 mi from [facility], with referral from DSS, may come six times, 30 days apart, bring your photo ID and SS cards for all residents of household   10:00am-1:00pm [address] the [facility], [address] [place], [address], [phone], 7.7 mi from [facility], can come once every thirty days with a referral from DSS, [employer], Mental Health, etc.- each referral good for six visits, bring photo ID   10:00am-1:00pm [address], [address], [address], [phone], 4.2 mi from [facility], can come once every 6 months, open to [facility] residents, bring photo ID and copy of a current utility bill in your name, please call first to verify that food is available  2:00pm-3:30pm [facility], [address], [phone], [phone], 3.7 mi from [facility], can come twelve times per year, one bag per family, bring photo ID   FIRST AND THIRD Tuesdays  10:00am-1:00pm, [address] [place], [address], [phone], 7.2 mi from [facility], can come once every 30 days   Tuesday, WHEN FOOD IS AVAILABLE (call)  12:00noon-2:00pm [facility], [address], [address], [phone], 1.5 mi from  [facility], can come once every 30 days, bring photo ID   Tuesday, by APPOINTMENT ONLY  [facility], [address] [place], [address],  [phone], 2.5 mi from [facility], call in advance for appointment between 1:00pm-4:00pm, bring your photo ID and SS cards for all residents of household, can come once every 3 months   [address] of [place], [address], [address], [phone], 5 mi from [facility], call one day ahead for appointment the next day between 10:00am and 12:00noon, can come once every 3 months, bring your photo ID and must qualify according to family income   [facility], [address], [address], [phone], 0.7 mi from [facility], call in advance for appointment, can come once every 30 days, bring your photo ID and SS cards for other residents of household   [address] of [place], Arsenio Larger [place], [address],  [phone], 5.1 mi from [facility], call between 9:00am and 1:00pm M-F to make appointment. Appointments are scheduled for Tues and Thurs from 10:00am-11:30am, bring photo ID, can come once every 6 months, limit three visits over  18 months, then must have referral  Wednesday  9:30am-5:00pm [facility], [address] [place] [phone], [phone], 0.9 mi from [facility], can come four times per year, bring your photo ID and SS cards for other residents of household, will make appointments for those who work and need to come after 5pm  9:30am-11:30am [employer] [facility], [address], [address], [phone], 6.6 mi from [facility], can come once every 30 days, bring your photo ID, and SS cards for other residents of household, each monthly visit requires a written referral from GUM or DSS with number in household on form  10:00am-12:00noon [address], [address] [place], [address], [phone], 1.7 mi from [facility], can come once every 60 days per household, need referral from DSS, [employer], etc., bring photo ID and SS card  10:00am-1:00pm [facility], [id number],  [phone], 3.9 mi from [facility], with referral from DSS, may come six times, 30 days apart, bring your photo ID and SS cards for all residents of household   10:00am-1:00pm [address] the [facility], [address] [place], [address], [phone], 7.7 mi from [facility], can come once every thirty days with a referral from DSS, [employer], Mental Health, etc. - each referral good for six visits, bring photo ID   2:00pm-5:45pm [address] [place], [address], [phone], 3.2 mi from [facility], once every 30 days, first come/first served, limited to first 25, bring photo ID   6:30pm-8:30pm [facility], [address], [address], [phone], 3.2 mi from [facility], can come once every 30 days, maximum 6 times per year, bring your photo ID and SS numbers for other residents of household  FIRST and THIRD Wednesdays   9:00am-12:00noon, [facility], [address] [place], [address], [phone], 4.2 mi from [facility], can come once a month. Please arrive and sign in no later than 11:15 so everyone can be served by 12 noon.  THIRD Wednesday  1:30pm-3:00pm, [place]. [address], [address] [place], [address], [phone] or [phone], 2.1 mi from [facility]  Wednesday, by APPOINTMENT ONLY  [address] [facility] of [place], [address] [address], [phone], 5 mi from [facility], call one day ahead for appointment the next day between 10:00am and 12:00noon, can come once every 3 months, bring your photo ID and must qualify according to family income   [facility], [address] [address], [phone], 0.7 mi from [facility], call in advance for  appointment, can come once every 30 days, bring your photo ID and SS cards for other residents of household   [address] of [place], [address] [place], [address], [phone], 3.2 mi from [facility], call in advance for appointment at 7:00pm, must provide valid photo ID  Thursday  9:00am-12:00noon [employer], [address], [address], [phone], 1.3 mi from [facility], can come once every 3 months, bring your photo ID and SS numbers for other residents of household   9:00am-1:00pm [facility] [address],  [place], [address], [phone]/Ext [phone], 1.6 mi from [facility], can come once every two weeks  9:30am-12:00noon [facility], [address] [place], [phone], 4.5 mi from [facility], can come once every 30 days, must state income [closed Thanksgiving and week of Christmas]  9:30am-5:00pm [employer], [address] [place] [phone], [phone], 0.9 mi from [facility], can come four times per year, bring your photo ID and SS card for other residents of household, will make appointments for those who work and need to come after 5pm  10:00am-1:00pm [facility], [address] [place], [id number],  [phone], 3.9 mi from [facility], with referral from DSS, may come six times, 30 days apart, bring your photo ID and SS cards for all residents of household   10:00am-1:00pm [address] the [facility], [address] [place], [address], [phone], 7.7 mi from [facility], can come once every thirty days with a referral from DSS, [employer], Mental Health, etc.- each referral good for six visits, bring photo ID   10:00am-1:00pm [facility], [address] [address], [phone], 4.2 mi from [facility], can come once every 6 months, open to [facility] residents, bring photo ID and copy of a current utility bill in your name, please call first to  verify that food is available   SUNDAYS BREAKFAST TWO LOCATIONS: 8:00am served in [facility] by Awaken PPL Corporation 8:30am SHUTTLE provided from [facility], served at [employer], [address] LUNCH TWO LOCATIONS [plus one additional third Sunday only] 10:30am - 12:30pm served at [employer], [employer], [place] [address] (1.2 miles from [facility]) 12:30pm served in [place] by Land O'Lakes Team (THIRD Sunday only) 1:30pm served at [facility] by [facility] one location [plus one additional third Sunday only] 5:00pm Every Sunday, served under the bridge at [address] by Lige Reeve Under the 3M Company (.7 miles from [facility]) (THIRD Sunday ONLY) 4:00pm served in the parking garage, across from Nucor Corporation, corner of Kimberly and [place] by Ryland Group Works Ministries MONDAYS BREAKFAST 7:30am served in [employer] by the United States [employer] Corporation and Friends LUNCH 10:30am - 12:30pm served at [employer], [employer], [place] [address] (1.2 miles from [facility]) DINNER TWO LOCATIONS: 7:00pm served in front of the courthouse at the corner of Goldman Sachs and Qwest Communications. by [place] Monday Night Meal (3 blocks from [facility]) 4:30pm served at the [employer], [address] by Bank of New York Company (0.6 miles from [facility]) TUESDAYS BREAKFAST 8:00am - 9:00am served at The TJX Companies, [address] (0.3 miles from [place]) LUNCH 10:30am - 12:30pm served at the E[employer], [employer] [address], (1.2 miles from [place]) DINNER 6:00pm served at CSX Corporation, enter from Capital One and go to the Sonic Automotive, (0.7 miles from [place]) [facility] BREAKFAST 7:00am - 8:00am served at [employer], [employer] [address], (1.2 miles from [place]) LUNCH ONE LOCATION [plus two additional locations listed below] 10:30am - 12:30pm served at [employer], [employer] [address] (1.2 miles from [place]) (FIRST Wednesday ONLY) 11:30am served at Dillard's, [place] [address] (6.6 miles from [place]) (SECOND Wednesday ONLY) 11:00am served at [place]. [place] of [address], [address] (1.3 miles from [place]) [place] TWO  LOCATIONS 6:00pm served at W. R. Berkley, [place] W. Visteon Corporation. (1.3 miles from [facility]) 4:00pm - 6:00pm (hot dogs and chips) served at Levi Strauss of [place] [facility], [address] (1.7 miles from [place]) [place] BREAKFAST NOT AVAILABLE AT THIS TIME LUNCH 10:30am - 12:30pm served at [employer]col[employer], Liberty [employer], [place] [address] (1.2 miles from [place]) DINNER 6:00pm served at CSX Corporation, enter from Capital One and go to the Sonic Automotive, (0.7 miles from Nucor Corporation) [place] BREAKFAST NOT AVAILABLE AT THIS TIME LUNCH 10:30am - 12:30pm served at [employer], [employer] [address] (1.2 miles from [place]) DINNER TWO LOCATIONS, [plus one additional first Friday only] 6:00pm served under the bridge at [address] by Lige Reeve Under CSX Corporation. (.7 miles from [facility]) 5:00pm - 7:00pm served at Levi Strauss of [facility], [address] (1.7 miles from [place]) (FIRST Friday ONLY) 5:45 pm - SHUTTLE provided from the LIBRARY at 5:45pm. Served at [facility], [address] [place]. SATURDAYS BREAKFAST TWO LOCATIONS [plus one additional last Saturday only] 8:00am served at [facility] by Delphi 8:30am served at Pulte Homes, [address] (2.2 miles from [facility]) (LAST  Saturday ONLY) 8:30am served at Beazer Homes, [address] (5 miles from [place]) LUNCH 10:30am - 12:30pm served at Ecol[employer], [employer] [address] (1.2 miles from [facility]) DINNER 6:00pm served under the bridge at [address] by World Fuel Services Corporation (0.7 miles from Nucor Corporation)  DIRECTIONS FROM [place] TO ALL MEAL LOCATIONS The Bridge at [address] (.7 miles from [address]) [address] on [place]. Turn Right onto DIRECTV 433 ft. Continue onto [address] under bridge, about 500 ft. Courthouse (3 blocks from [facility]) [place] on [address]. Turn right on [place]  1 block to PPL Corporation (.5 miles from [place]) [place] on [place]. YRC Worldwide. past Brink's Company to EMCOR. Enter from Capital One and go to the Affiliated Computer Services building W. R. Berkley [address] Visteon [address]. (1.3 miles from [facility]) [address] on [place]. Turn Right onto W. Melanie Spires. church will be on the Left. The TJX Companies [address] (.3 miles from [facility]) Go .3 miles on W. Friend[address] Destination is on your right Dillard's at ONEOK (6.6 miles from [address]) [address] on [place] toward W Friend[address] Turn right onto W Friend[address] Continue onto Alcoa Inc. Continue onto Toll Brothers. 5. Latina Pol is on right Sanmina-SCI Futures trader) [address] (.6 miles from [place]) [place] on [place]. [address] Turn Left onto [address] 0.3 miles Destination is on the Left. [place] Black & Decker at American Express (5 miles from Nucor Corporation) 1. Head south on [address]. Turn right onto [address] Friend[address] Turn slightly left onto Quest Diagnostics Continue onto Quest Diagnostics Turn right at Barnes & Noble Continue to church on right [place] Sounds of [facility] [address] (1.7 miles from [place]) [address] on [place] 1.4 miles College becomes [place]. Elm  Jeryl Moris. Continue 0.6 miles and church will be on theright. [place] Guardian Life Insurance at [address] (2.5 miles from Nucor Corporation) [place] provided from Massachusetts Mutual Life [place]] [place] on [place]. Elm toward Estée Lauder right onto Costco Wholesale left onto Emerson Electric Turn left onto Micron Technology [address] [place]  [place] (2.2 miles from [address]) [address] on [place] 1.4 miles [place] becomes [place]. [place] [address] Turn right onto [address] [place]  [address] and church will be on the Left. Potter's House/Dodge AT&T [address] (1.2 miles from [facility]) 1.Turn right onto [facility] 2.Turn left onto [place] 3.[place] 4.Destination is on your right [place]. [place] of [address] at ToysRus (1.3 miles from [facility]) [address] on [address] Turn left onto Genuine Parts right onto S. [place]. Continue onto KB Home	[place]. Turn left onto Smurfit-Stone Container. Turn right onto WellPoint.

## 2024-10-07 NOTE — Plan of Care (Signed)

## 2024-10-07 NOTE — TOC CM/SW Note (Signed)
 Transition of Care [facility]) - Inpatient Brief Assessment   Patient Details  Name: Katherine Browning MRN: [id number] Date of Birth: [birth year]  Transition of Care [facility]) CM/SW Contact:    Lauraine FORBES Saa, LCSWA Phone Number: 10/07/2024, 1:52 PM   Clinical Narrative:  1:53 PM Per chart review, patient resides at home alone. Patient has a PCP and insurance. Patient has SNF/HH/DME history. Patient's preferred pharmacy is [facility] [phone] [facility] ([place]). CSW provided SDOH (food) resources. No TOC needs identified at this time. TOC will continue to follow.  Transition of Care Asessment: Insurance and Status: Insurance coverage has been reviewed Patient has primary care physician: Yes Home environment has been reviewed: Private Residence Prior level of function:: N/A Prior/Current Home Services: No current home services Social Drivers of Health Review: SDOH reviewed interventions complete Readmission risk has been reviewed: Yes (Currently Observation Status) Transition of care needs: no transition of care needs at this time

## 2024-10-07 NOTE — Progress Notes (Signed)
 PHARMACY - ANTICOAGULATION CONSULT NOTE  Pharmacy Consult for heparin Indication: NSTEMI  Allergies  Allergen Reactions   Ivp Dye [Iodinated Contrast Media] Hives   Biaxin [Clarithromycin] Hives and Nausea And Vomiting   Iodine Hives   Lisinopril  Swelling   Vicodin [Hydrocodone -Acetaminophen ] Nausea And Vomiting    Patient Measurements: Height: 5' 5 (165.1 cm) Weight: (!) 159.2 kg (351 lb) IBW/kg (Calculated) : 57 HEPARIN DW (KG): 97.6  Vital Signs: Temp: 98.7 F (37.1 C) (11/17 0445) Temp Source: Oral (11/17 0445) BP: 128/79 (11/17 0445) Pulse Rate: 79 (11/17 0445)  Labs: Recent Labs    10/05/24 1530 10/06/24 0509 10/06/24 0531 10/06/24 1308 10/07/24 0006 10/07/24 0545  HGB 14.2  --  12.7  --   --  13.5  HCT 43.7  --  39.6  --   --  40.6  PLT 240  --  231  --   --  242  HEPARINUNFRC  --    < >  --  0.16* 0.31 0.28*  CREATININE 1.01*  --   --   --   --   --    < > = values in this interval not displayed.    Estimated Creatinine Clearance: 96.1 mL/min (A) (by C-G formula based on SCr of 1.01 mg/dL (H)).   Medical History: Past Medical History:  Diagnosis Date   Back pain    Hypertension    Knee pain      Assessment: 58 yo female presents with chest pain, pharmacy to dose heparin drip. No prior anti-coagulation noted.  Heparin level slightly below goal at 0.28. CBC ok, cath planned today.  Goal of Therapy:  Heparin level 0.3-0.7 units/ml Monitor platelets by anticoagulation protocol: Yes   Plan:  -Increase heparin to 1750 units/h -F/U heparin plans post/cath  Ozell Jamaica, PharmD, BCPS, [facility] Clinical Pharmacist [phone] Please check AMION for all [facility] Pharmacy numbers 10/07/2024

## 2024-10-08 ENCOUNTER — Inpatient Hospital Stay (HOSPITAL_COMMUNITY): Admission: EM | Disposition: A | Payer: Self-pay | Source: Home / Self Care | Attending: Family Medicine

## 2024-10-08 DIAGNOSIS — I160 Hypertensive urgency: Secondary | ICD-10-CM

## 2024-10-08 DIAGNOSIS — I214 Non-ST elevation (NSTEMI) myocardial infarction: Secondary | ICD-10-CM | POA: Diagnosis not present

## 2024-10-08 DIAGNOSIS — I161 Hypertensive emergency: Secondary | ICD-10-CM | POA: Diagnosis present

## 2024-10-08 DIAGNOSIS — R0789 Other chest pain: Secondary | ICD-10-CM | POA: Diagnosis present

## 2024-10-08 DIAGNOSIS — Z79899 Other long term (current) drug therapy: Secondary | ICD-10-CM | POA: Diagnosis not present

## 2024-10-08 DIAGNOSIS — Z7985 Long-term (current) use of injectable non-insulin antidiabetic drugs: Secondary | ICD-10-CM | POA: Diagnosis not present

## 2024-10-08 DIAGNOSIS — I21A1 Myocardial infarction type 2: Secondary | ICD-10-CM | POA: Diagnosis present

## 2024-10-08 DIAGNOSIS — N179 Acute kidney failure, unspecified: Secondary | ICD-10-CM | POA: Diagnosis not present

## 2024-10-08 DIAGNOSIS — R079 Chest pain, unspecified: Secondary | ICD-10-CM | POA: Diagnosis not present

## 2024-10-08 DIAGNOSIS — G4733 Obstructive sleep apnea (adult) (pediatric): Secondary | ICD-10-CM | POA: Diagnosis present

## 2024-10-08 DIAGNOSIS — Z6841 Body Mass Index (BMI) 40.0 and over, adult: Secondary | ICD-10-CM | POA: Diagnosis not present

## 2024-10-08 DIAGNOSIS — E785 Hyperlipidemia, unspecified: Secondary | ICD-10-CM | POA: Diagnosis present

## 2024-10-08 DIAGNOSIS — Z885 Allergy status to narcotic agent status: Secondary | ICD-10-CM | POA: Diagnosis not present

## 2024-10-08 DIAGNOSIS — E88819 Insulin resistance, unspecified: Secondary | ICD-10-CM | POA: Diagnosis present

## 2024-10-08 DIAGNOSIS — Z8249 Family history of ischemic heart disease and other diseases of the circulatory system: Secondary | ICD-10-CM | POA: Diagnosis not present

## 2024-10-08 DIAGNOSIS — Z881 Allergy status to other antibiotic agents status: Secondary | ICD-10-CM | POA: Diagnosis not present

## 2024-10-08 DIAGNOSIS — K219 Gastro-esophageal reflux disease without esophagitis: Secondary | ICD-10-CM | POA: Diagnosis present

## 2024-10-08 DIAGNOSIS — Z888 Allergy status to other drugs, medicaments and biological substances status: Secondary | ICD-10-CM | POA: Diagnosis not present

## 2024-10-08 DIAGNOSIS — M25531 Pain in right wrist: Secondary | ICD-10-CM | POA: Diagnosis not present

## 2024-10-08 DIAGNOSIS — M109 Gout, unspecified: Secondary | ICD-10-CM | POA: Diagnosis present

## 2024-10-08 DIAGNOSIS — I10 Essential (primary) hypertension: Secondary | ICD-10-CM | POA: Diagnosis not present

## 2024-10-08 DIAGNOSIS — Z91041 Radiographic dye allergy status: Secondary | ICD-10-CM | POA: Diagnosis not present

## 2024-10-08 HISTORY — PX: LEFT HEART CATH AND CORONARY ANGIOGRAPHY: CATH118249

## 2024-10-08 LAB — BASIC METABOLIC PANEL WITH GFR
Anion gap: 14 (ref 5–15)
BUN: 23 mg/dL — ABNORMAL HIGH (ref 6–20)
CO2: 23 mmol/L (ref 22–32)
Calcium: 9.6 mg/dL (ref 8.9–10.3)
Chloride: 98 mmol/L (ref 98–111)
Creatinine, Ser: 1.09 mg/dL — ABNORMAL HIGH (ref 0.44–1.00)
GFR, Estimated: 60 mL/min — ABNORMAL LOW (ref >60)
Glucose, Bld: 103 mg/dL — ABNORMAL HIGH (ref 70–99)
Potassium: 4.2 mmol/L (ref 3.5–5.1)
Sodium: 135 mmol/L (ref 135–145)

## 2024-10-08 LAB — HEPARIN LEVEL (UNFRACTIONATED): Heparin Unfractionated: 0.54 [IU]/mL (ref 0.30–0.70)

## 2024-10-08 LAB — CBC
HCT: 44 % (ref 36.0–46.0)
Hemoglobin: 14.5 g/dL (ref 12.0–15.0)
MCH: 29.8 pg (ref 26.0–34.0)
MCHC: 33 g/dL (ref 30.0–36.0)
MCV: 90.5 fL (ref 80.0–100.0)
Platelets: 236 K/uL (ref 150–400)
RBC: 4.86 MIL/uL (ref 3.87–5.11)
RDW: 15.2 % (ref 11.5–15.5)
WBC: 7.2 K/uL (ref 4.0–10.5)
nRBC: 0 % (ref 0.0–0.2)

## 2024-10-08 LAB — MISC LABCORP TEST (SEND OUT): Labcorp test code: 83935

## 2024-10-08 MED ORDER — MIDAZOLAM HCL (PF) 2 MG/2ML IJ SOLN
INTRAMUSCULAR | Status: DC | PRN
  Administered 2024-10-08: 2 mg via INTRAVENOUS

## 2024-10-08 MED ORDER — LABETALOL HCL 5 MG/ML IV SOLN: 10.0000 mg | INTRAVENOUS | Status: AC | PRN

## 2024-10-08 MED ORDER — LABETALOL HCL 5 MG/ML IV SOLN
INTRAVENOUS | Status: DC | PRN
Start: 2024-10-08 — End: 2024-10-08
  Administered 2024-10-08: 20 mg via INTRAVENOUS

## 2024-10-08 MED ORDER — LIDOCAINE HCL (PF) 1 % IJ SOLN
INTRAMUSCULAR | Status: DC | PRN
  Administered 2024-10-08: 2 mL
  Administered 2024-10-08: 5 mL

## 2024-10-08 MED ORDER — HYDRALAZINE HCL 20 MG/ML IJ SOLN
INTRAMUSCULAR | Status: AC
  Filled 2024-10-08: qty 1

## 2024-10-08 MED ORDER — FENTANYL CITRATE (PF) 100 MCG/2ML IJ SOLN
INTRAMUSCULAR | Status: DC | PRN
  Administered 2024-10-08: 25 ug via INTRAVENOUS

## 2024-10-08 MED ORDER — SODIUM CHLORIDE 0.9 % WEIGHT BASED INFUSION
3.0000 mL/kg/h | INTRAVENOUS | Status: DC
  Administered 2024-10-08: 3 mL/kg/h via INTRAVENOUS

## 2024-10-08 MED ORDER — MIDAZOLAM HCL 2 MG/2ML IJ SOLN
INTRAMUSCULAR | Status: AC
  Filled 2024-10-08: qty 2

## 2024-10-08 MED ORDER — LIDOCAINE HCL (PF) 1 % IJ SOLN
INTRAMUSCULAR | Status: AC
  Filled 2024-10-08: qty 30

## 2024-10-08 MED ORDER — ENOXAPARIN SODIUM 40 MG/0.4ML IJ SOSY
40.0000 mg | PREFILLED_SYRINGE | INTRAMUSCULAR | Status: DC
  Filled 2024-10-08: qty 0.4

## 2024-10-08 MED ORDER — HYDRALAZINE HCL 20 MG/ML IJ SOLN: 10.0000 mg | INTRAMUSCULAR | Status: AC | PRN

## 2024-10-08 MED ORDER — FENTANYL CITRATE (PF) 100 MCG/2ML IJ SOLN
INTRAMUSCULAR | Status: AC
  Filled 2024-10-08: qty 2

## 2024-10-08 MED ORDER — SODIUM CHLORIDE 0.9 % IV SOLN: 250.0000 mL | INTRAVENOUS | Status: DC | PRN

## 2024-10-08 MED ORDER — SODIUM CHLORIDE 0.9% FLUSH: 3.0000 mL | INTRAVENOUS | Status: DC | PRN

## 2024-10-08 MED ORDER — FREE WATER
500.0000 mL | Freq: Once | Status: AC
  Administered 2024-10-08: 500 mL via ORAL

## 2024-10-08 MED ORDER — HYDRALAZINE HCL 20 MG/ML IJ SOLN
INTRAMUSCULAR | Status: DC | PRN
  Administered 2024-10-08: 10 mg via INTRAVENOUS

## 2024-10-08 MED ORDER — SODIUM CHLORIDE 0.9 % WEIGHT BASED INFUSION
1.0000 mL/kg/h | INTRAVENOUS | Status: DC
  Administered 2024-10-08: 1 mL/kg/h via INTRAVENOUS

## 2024-10-08 MED ORDER — HEPARIN SODIUM (PORCINE) 1000 UNIT/ML IJ SOLN
INTRAMUSCULAR | Status: AC
Start: 2024-10-08 — End: 2024-10-08
  Filled 2024-10-08: qty 10

## 2024-10-08 MED ORDER — IOHEXOL 350 MG/ML SOLN
INTRAVENOUS | Status: DC | PRN
  Administered 2024-10-08: 50 mL

## 2024-10-08 MED ORDER — VERAPAMIL HCL 2.5 MG/ML IV SOLN
INTRAVENOUS | Status: AC
  Filled 2024-10-08: qty 2

## 2024-10-08 MED ORDER — VERAPAMIL HCL 2.5 MG/ML IV SOLN
INTRAVENOUS | Status: DC | PRN
  Administered 2024-10-08: 10 mL via INTRA_ARTERIAL

## 2024-10-08 MED ORDER — HEPARIN (PORCINE) IN NACL 1000-0.9 UT/500ML-% IV SOLN
INTRAVENOUS | Status: DC | PRN
  Administered 2024-10-08: 1000 mL

## 2024-10-08 MED ORDER — SODIUM CHLORIDE 0.9% FLUSH
3.0000 mL | Freq: Two times a day (BID) | INTRAVENOUS | Status: DC
  Administered 2024-10-08 – 2024-10-09 (×2): 3 mL via INTRAVENOUS

## 2024-10-08 NOTE — Progress Notes (Signed)
  Progress Note  Patient Name: Katherine Browning Date of Encounter: 10/08/2024 [facility] Cardiologist: None   Interval Summary   No events overnight. She denies any CP or dyspnea and walked down the hallway yesterday.  Vital Signs Vitals:   10/07/24 1802 10/07/24 2026 10/08/24 0000 10/08/24 0411  BP: (!) 143/98 (!) 149/85 131/79 110/73  Pulse: 84 85  71  Resp: 18 20 20 18   Temp:  98.5 F (36.9 C) 98.6 F (37 C) 98 F (36.7 C)  TempSrc:   Oral Oral  SpO2: 100% 98% 99% 96%  Weight:      Height:        Intake/Output Summary (Last 24 hours) at 10/08/2024 0728 Last data filed at 10/07/2024 1310 Gross per 24 hour  Intake 662.74 ml  Output --  Net 662.74 ml      10/06/2024    4:12 PM 10/05/2024    1:12 PM 09/02/2024    3:36 PM  Last 3 Weights  Weight (lbs) 351 lb 363 lb 1.6 oz 363 lb  Weight (kg) 159.213 kg 164.7 kg 164.656 kg      Telemetry/ECG  NSR - Personally Reviewed  Physical Exam  GEN: No acute distress.   Neck: No JVD Cardiac: RRR, no murmurs, rubs, or gallops.  Respiratory: Clear to auscultation bilaterally. GI: Soft, nontender, non-distended  Katherine: No edema  Assessment & Plan  Katherine Browning is a 42 yoM with Hx of HTN and obesity who presented with CP, troponin elevation (up to 282) and BP 204/112 (in setting of missing HTZ for a couple of days), and admitted for NSTEMI and BP control.   #NSTEMI #HTN emergency #Severe MAC - Asymptomatic and BP controlled. LHC pending kidney function. - TTE with EF 65-70%, GIIDD and severe MAC - Cont ASA, heparin gtt, and rosuvastatin 20 - Cont metop; holding ARB given AKI - Follow up with Lpa - Unclear as to the etiology of her severe MAC as such a young age; no Hx of CKD or hypercalcemia - We will continue to follow   For questions or updates, please contact [facility] Please consult [email or web address] for contact info under   Katherine Browning Katherine Donley, MD

## 2024-10-08 NOTE — Plan of Care (Signed)
  Problem: Education: Goal: Knowledge of General Education information will improve Description: Including pain rating scale, medication(s)/side effects and non-pharmacologic comfort measures Outcome: Progressing   Problem: Health Behavior/Discharge Planning: Goal: Ability to manage health-related needs will improve Outcome: Progressing   Problem: Clinical Measurements: Goal: Cardiovascular complication will be avoided Outcome: Progressing   Problem: Coping: Goal: Level of anxiety will decrease Outcome: Progressing   

## 2024-10-08 NOTE — H&P (View-Only) (Signed)
  Progress Note  Patient Name: Katherine Browning Date of Encounter: 10/08/2024 [facility] Cardiologist: None   Interval Summary   No events overnight. She denies any CP or dyspnea and walked down the hallway yesterday.  Vital Signs Vitals:   10/07/24 1802 10/07/24 2026 10/08/24 0000 10/08/24 0411  BP: (!) 143/98 (!) 149/85 131/79 110/73  Pulse: 84 85  71  Resp: 18 20 20 18   Temp:  98.5 F (36.9 C) 98.6 F (37 C) 98 F (36.7 C)  TempSrc:   Oral Oral  SpO2: 100% 98% 99% 96%  Weight:      Height:        Intake/Output Summary (Last 24 hours) at 10/08/2024 0728 Last data filed at 10/07/2024 1310 Gross per 24 hour  Intake 662.74 ml  Output --  Net 662.74 ml      10/06/2024    4:12 PM 10/05/2024    1:12 PM 09/02/2024    3:36 PM  Last 3 Weights  Weight (lbs) 351 lb 363 lb 1.6 oz 363 lb  Weight (kg) 159.213 kg 164.7 kg 164.656 kg      Telemetry/ECG  NSR - Personally Reviewed  Physical Exam  GEN: No acute distress.   Neck: No JVD Cardiac: RRR, no murmurs, rubs, or gallops.  Respiratory: Clear to auscultation bilaterally. GI: Soft, nontender, non-distended  Katherine: No edema  Assessment & Plan  Katherine Browning is a 42 yoM with Hx of HTN and obesity who presented with CP, troponin elevation (up to 282) and BP 204/112 (in setting of missing HTZ for a couple of days), and admitted for NSTEMI and BP control.   #NSTEMI #HTN emergency #Severe MAC - Asymptomatic and BP controlled. LHC pending kidney function. - TTE with EF 65-70%, GIIDD and severe MAC - Cont ASA, heparin gtt, and rosuvastatin 20 - Cont metop; holding ARB given AKI - Follow up with Lpa - Unclear as to the etiology of her severe MAC as such a young age; no Hx of CKD or hypercalcemia - We will continue to follow   For questions or updates, please contact [facility] Please consult [email or web address] for contact info under   [name] Ren Donley, MD

## 2024-10-08 NOTE — Progress Notes (Signed)
 PHARMACY - ANTICOAGULATION CONSULT NOTE  Pharmacy Consult for heparin Indication: NSTEMI  Allergies  Allergen Reactions   Ivp Dye [Iodinated Contrast Media] Hives   Biaxin [Clarithromycin] Hives and Nausea And Vomiting   Iodine Hives   Lisinopril  Swelling   Vicodin [Hydrocodone -Acetaminophen ] Nausea And Vomiting    Patient Measurements: Height: 5' 5 (165.1 cm) Weight: (!) 159.2 kg (351 lb) IBW/kg (Calculated) : 57 HEPARIN DW (KG): 97.6  Vital Signs: Temp: 98 F (36.7 C) (11/18 0411) Temp Source: Oral (11/18 0411) BP: 110/73 (11/18 0411) Pulse Rate: 71 (11/18 0411)  Labs: Recent Labs    11/Katherine/25 1530 10/06/24 0509 10/06/24 0531 10/06/24 1308 10/07/24 0006 10/07/24 0545 10/08/24 0607  HGB 14.2  --  12.7  --   --  13.5 14.5  HCT 43.7  --  39.6  --   --  40.6 44.0  PLT 240  --  231  --   --  242 236  HEPARINUNFRC  --    < >  --    < > 0.31 0.28* 0.54  CREATININE 1.01*  --   --   --   --  1.43*  --    < > = values in this interval not displayed.    Estimated Creatinine Clearance: 67.9 mL/min (A) (by C-G formula based on SCr of 1.43 mg/dL (H)).   Medical History: Past Medical History:  Diagnosis Date   Back pain    Hypertension    Knee pain      Assessment: 58 yo Katherine Browning presents with chest pain, pharmacy to dose heparin drip. No prior anti-coagulation noted.  Heparin level therapeutic, CBC wnl.  Goal of Therapy:  Heparin level 0.3-0.7 units/ml Monitor platelets by anticoagulation protocol: Yes   Plan:  -Continue heparin to 1750 units/h -Daily heparin level and CBC  Ozell Jamaica, PharmD, [employer], [facility] Clinical Pharmacist [phone] Please check AMION for all [facility] Pharmacy numbers 10/08/2024

## 2024-10-08 NOTE — Progress Notes (Signed)
 PROGRESS NOTE    Katherine Browning  FMW:[id number] DOB: 11-04-1966 DOA: 10/05/2024 PCP: Katherine Browning LABOR, MD   Brief Narrative:  This 58 year old Female with PMH significant for gout, hypertension, OSA on CPAP, insulin resistant, morbid obesity presents in the ED complaining of chest pain.  Evaluation in the ED consistent with sinus rhythm, elevation of troponin up to 200, hypertensive emergency with  blood pressure 204/112.  Of note patient reported that after she burped her chest pain resolved in the ED. She missed the dose of hydrochlorothiazide for a few days.  Also clonidine  was stopped and she was started on hydrochlorothiazide. Patient was admitted for NSTEMI, started on heparin, She will need LHC. Cardiology is following.  Assessment & Plan:   Principal Problem:   Chest pain Active Problems:   Gout   GERD (gastroesophageal reflux disease)   NSTEMI (non-ST elevated myocardial infarction) (HCC)   Hypertensive emergency   Chest pain, with elevated troponin:  NSTEMI: Patient presented with chest pain , now improved. Continue heparin gtt , Cardiology consulted, planning for LHC,once renal functions improve. Echo showed LVEF 65-70% GIIDD Continue ASA, heparin gtt, Crestor 20 mg. Coreg 6.25 mg bid, Hydralazine 25 mg TID. Hold ARB due to AKI.   Hypertensive emergency: Continue Norvasc , Coreg, stop metoprolol. -PRN hydralazine and Labetalol.  -BP improved.   Gout: Continue Allopurinol .    GERD: Continue PPI.  AKI : > Improving  Baseline serum creatinine normal. Serum creatinine slightly up.1.43.>1.09 Avoid nephrotoxic medications.   Continue IV hydration.   DVT prophylaxis: Heparin gtt Code Status: (Full code) Family Communication: No Family at bed side Disposition Plan:    Admitted for NSTEMI, awaiting LHC once renal functions improve.   Consultants:  Cardiology  Procedures: Echo  Antimicrobials:  Anti-infectives (From admission, onward)    None       Subjective: Patient was seen and examined at bedside.  Overnight events noted. Patient denies any chest pain.  She is awaiting left heart cath to be done once renal functions improved.   Objective: Vitals:   10/08/24 0411 10/08/24 0700 10/08/24 0918 10/08/24 1252  BP: 110/73 (!) 146/86 (!) 146/86 (!) 132/94  Pulse: 71 79 79   Resp: 18 16  17   Temp: 98 F (36.7 C) 98.2 F (36.8 C)  97.8 F (36.6 C)  TempSrc: Oral Oral  Oral  SpO2: 96% 97%  96%  Weight:      Height:       No intake or output data in the 24 hours ending 10/08/24 1446  Filed Weights   10/05/24 1312 10/06/24 1612  Weight: (!) 164.7 kg (!) 159.2 kg    Examination:  General exam: Appears calm and comfortable , Morbidly obese. Respiratory system: Clear to auscultation. Respiratory effort normal. RR 14 Cardiovascular system: S1 & S2 heard, RRR. No JVD, murmurs, rubs, gallops or clicks.  Gastrointestinal system: Abdomen is nondistended, soft and nontender. No organomegaly or masses felt. Normal bowel sounds heard. Central nervous system: Alert and oriented x 3. No focal neurological deficits. Extremities: Symmetric 5 x 5 power. Skin: No rashes, lesions or ulcers Psychiatry: Judgement and insight appear normal. Mood & affect appropriate.     Data Reviewed: I have personally reviewed following labs and imaging studies  CBC: Recent Labs  Lab 10/05/24 1530 10/06/24 0531 10/07/24 0545 10/08/24 0607  WBC 7.0 6.5 7.4 7.2  NEUTROABS 4.5  --   --   --   HGB 14.2 12.7 13.5 14.5  HCT 43.7 39.6 40.6 44.0  MCV 93.4 92.5 89.8 90.5  PLT 240 231 242 236   Basic Metabolic Panel: Recent Labs  Lab 10/05/24 1530 10/07/24 0545 10/08/24 0803  NA 141 134* 135  K 4.4 4.0 4.2  CL 104 99 98  CO2 25 23 23   GLUCOSE 105* 114* 103*  BUN 14 16 23*  CREATININE 1.01* 1.43* 1.09*  CALCIUM 10.0 9.4 9.6   GFR: Estimated Creatinine Clearance: 89.1 mL/min (A) (by C-G formula based on SCr of 1.09 mg/dL (H)). Liver  Function Tests: No results for input(s): AST, ALT, ALKPHOS, BILITOT, PROT, ALBUMIN in the last 168 hours. No results for input(s): LIPASE, AMYLASE in the last 168 hours. No results for input(s): AMMONIA in the last 168 hours. Coagulation Profile: No results for input(s): INR, PROTIME in the last 168 hours. Cardiac Enzymes: No results for input(s): CKTOTAL, CKMB, CKMBINDEX, TROPONINI in the last 168 hours. BNP (last 3 results) No results for input(s): PROBNP in the last 8760 hours. HbA1C: No results for input(s): HGBA1C in the last 72 hours. CBG: No results for input(s): GLUCAP in the last 168 hours. Lipid Profile: No results for input(s): CHOL, HDL, LDLCALC, TRIG, CHOLHDL, LDLDIRECT in the last 72 hours. Thyroid  Function Tests: No results for input(s): TSH, T4TOTAL, FREET4, T3FREE, THYROIDAB in the last 72 hours. Anemia Panel: No results for input(s): VITAMINB12, FOLATE, FERRITIN, TIBC, IRON, RETICCTPCT in the last 72 hours. Sepsis Labs: No results for input(s): PROCALCITON, LATICACIDVEN in the last 168 hours.  No results found for this or any previous visit (from the past 240 hours).   Radiology Studies: ECHOCARDIOGRAM COMPLETE Result Date: 10/06/2024    ECHOCARDIOGRAM REPORT   Patient Name:   Katherine Browning Date of Exam: 10/06/2024 Medical Rec #:  [id number]     Height:       65.0 in Accession #:    [id number]    Weight:       363.1 lb Date of Birth:  1966/09/26     BSA:          2.549 m Patient Age:    58 years      BP:           163/84 mmHg Patient Gender: F             HR:           74 bpm. Exam Location:  Inpatient Procedure: 2D Echo and Strain Analysis (Both Spectral and Color Flow Doppler            were utilized during procedure). Indications:    Elevated Troponin  History:        Patient has no prior history of Echocardiogram examinations.  Sonographer:    Katherine Browning Referring Phys: Katherine Browning Katherine Browning  Katherine Browning  Sonographer Comments: Image acquisition challenging due to patient body habitus. IMPRESSIONS  1. Left ventricular ejection fraction, by estimation, is 65 to 70%. The left ventricle has normal function. The left ventricle has no regional wall motion abnormalities. There is mild left ventricular hypertrophy. Left ventricular diastolic parameters are consistent with Grade II diastolic dysfunction (pseudonormalization). Elevated left atrial pressure. The average left ventricular global longitudinal strain is -21.5 %. The global longitudinal strain is normal.  2. Right ventricular systolic function is normal. The right ventricular size is normal. Tricuspid regurgitation signal is inadequate for assessing PA pressure.  3. Left atrial size was moderately dilated.  4. The mitral valve is degenerative. Trivial mitral valve regurgitation. Mild mitral stenosis. The mean mitral valve gradient is 5.0  mmHg with average heart rate of 91 bpm. Severe mitral annular calcification.  5. The aortic valve was not well visualized. Aortic valve regurgitation is not visualized. No aortic stenosis is present. FINDINGS  Left Ventricle: Left ventricular ejection fraction, by estimation, is 65 to 70%. The left ventricle has normal function. The left ventricle has no regional wall motion abnormalities. The average left ventricular global longitudinal strain is -21.5 %. Strain was performed and the global longitudinal strain is normal. The left ventricular internal cavity size was normal in size. There is mild left ventricular hypertrophy. Left ventricular diastolic parameters are consistent with Grade II diastolic dysfunction (pseudonormalization). Elevated left atrial pressure. Right Ventricle: The right ventricular size is normal. Right vetricular wall thickness was not well visualized. Right ventricular systolic function is normal. Tricuspid regurgitation signal is inadequate for assessing PA pressure. Left Atrium: Left atrial size  was moderately dilated. Right Atrium: Right atrial size was normal in size. Pericardium: There is no evidence of pericardial effusion. Mitral Valve: The mitral valve is degenerative in appearance. Severe mitral annular calcification. Trivial mitral valve regurgitation. Mild mitral valve stenosis. MV peak gradient, 10.6 mmHg. The mean mitral valve gradient is 5.0 mmHg with average heart rate of 91 bpm. Tricuspid Valve: The tricuspid valve is normal in structure. Tricuspid valve regurgitation is trivial. Aortic Valve: The aortic valve was not well visualized. Aortic valve regurgitation is not visualized. No aortic stenosis is present. Pulmonic Valve: The pulmonic valve was not well visualized. Pulmonic valve regurgitation is not visualized. Aorta: The aortic root and ascending aorta are structurally normal, with no evidence of dilitation. IAS/Shunts: The interatrial septum was not well visualized.  LEFT VENTRICLE PLAX 2D LVIDd:         4.20 cm   Diastology LVIDs:         2.40 cm   LV e' medial:    6.09 cm/s LV PW:         1.30 cm   LV E/e' medial:  20.0 LV IVS:        1.20 cm   LV e' lateral:   6.53 cm/s LVOT diam:     2.20 cm   LV E/e' lateral: 18.7 LVOT Area:     3.80 cm                          2D Longitudinal Strain                          2D Strain GLS Avg:     -21.5 % RIGHT VENTRICLE RV Basal diam:  2.80 cm RV Mid diam:    2.50 cm TAPSE (M-mode): 3.0 cm LEFT ATRIUM              Index        RIGHT ATRIUM           Index LA diam:        4.30 cm  1.69 cm/m   RA Area:     11.00 cm LA Vol (A2C):   105.0 ml 41.20 ml/m  RA Volume:   20.20 ml  7.93 ml/m LA Vol (A4C):   97.5 ml  38.25 ml/m LA Biplane Vol: 106.0 ml 41.59 ml/m   AORTA Ao Asc diam: 3.50 cm MITRAL VALVE MV Area (PHT): 3.20 cm     SHUNTS MV Peak grad:  10.6 mmHg    Systemic Diam: 2.20 cm MV Mean grad:  5.0 mmHg MV Vmax:       1.63 m/s MV Vmean:      105.0 cm/s MV Decel Time: 237 msec MV E velocity: 122.00 cm/s MV A velocity: 138.00 cm/s MV E/A ratio:   0.88 Lonni Nanas MD Electronically signed by Lonni Nanas MD Signature Date/Time: 10/06/2024/4:13:34 PM    Final    Scheduled Meds:  allopurinol   300 mg Oral Daily   aspirin EC  81 mg Oral Daily   metoprolol tartrate  25 mg Oral BID   pantoprazole   40 mg Oral Daily   rosuvastatin  20 mg Oral Daily   Continuous Infusions:  sodium chloride 1 mL/kg/hr (10/08/24 1046)   heparin 1,750 Units/hr (10/07/24 2307)     LOS: 0 days    Time spent: 35 mins    Darcel Dawley, MD [facility]   If 7PM-7AM, please contact night-coverage

## 2024-10-08 NOTE — Plan of Care (Signed)
   Problem: Coping: Goal: Level of anxiety will decrease Outcome: Progressing

## 2024-10-08 NOTE — Interval H&P Note (Signed)
 History and Physical Interval Note:  10/08/2024 2:34 PM  Ame Heagle  has presented today for surgery, with the diagnosis of NSTEMI.  The various methods of treatment have been discussed with the patient and family. After consideration of risks, benefits and other options for treatment, the patient has consented to  Procedure(s): LEFT HEART CATH AND CORONARY ANGIOGRAPHY (N/A) as a surgical intervention.  The patient's history has been reviewed, patient examined, no change in status, stable for surgery.  I have reviewed the patient's chart and labs.  Questions were answered to the patient's satisfaction.   Cath Lab Visit (complete for each Cath Lab visit)  Clinical Evaluation Leading to the Procedure:   ACS: Yes.    Non-ACS:    Anginal Classification: CCS III  Anti-ischemic medical therapy: No Therapy  Non-Invasive Test Results: No non-invasive testing performed  Prior CABG: No previous CABG        [facility] 10/08/2024 2:34 PM

## 2024-10-09 ENCOUNTER — Encounter (HOSPITAL_COMMUNITY): Payer: Self-pay | Admitting: Cardiology

## 2024-10-09 ENCOUNTER — Other Ambulatory Visit (HOSPITAL_COMMUNITY): Payer: Self-pay

## 2024-10-09 DIAGNOSIS — R079 Chest pain, unspecified: Secondary | ICD-10-CM | POA: Diagnosis not present

## 2024-10-09 LAB — CBC
HCT: 41.2 % (ref 36.0–46.0)
Hemoglobin: 13.7 g/dL (ref 12.0–15.0)
MCH: 29.9 pg (ref 26.0–34.0)
MCHC: 33.3 g/dL (ref 30.0–36.0)
MCV: 90 fL (ref 80.0–100.0)
Platelets: 256 K/uL (ref 150–400)
RBC: 4.58 MIL/uL (ref 3.87–5.11)
RDW: 15 % (ref 11.5–15.5)
WBC: 8.3 K/uL (ref 4.0–10.5)
nRBC: 0 % (ref 0.0–0.2)

## 2024-10-09 LAB — BASIC METABOLIC PANEL WITH GFR
Anion gap: 14 (ref 5–15)
BUN: 22 mg/dL — ABNORMAL HIGH (ref 6–20)
CO2: 19 mmol/L — ABNORMAL LOW (ref 22–32)
Calcium: 9.4 mg/dL (ref 8.9–10.3)
Chloride: 100 mmol/L (ref 98–111)
Creatinine, Ser: 1.13 mg/dL — ABNORMAL HIGH (ref 0.44–1.00)
GFR, Estimated: 57 mL/min — ABNORMAL LOW (ref >60)
Glucose, Bld: 142 mg/dL — ABNORMAL HIGH (ref 70–99)
Potassium: 4.6 mmol/L (ref 3.5–5.1)
Sodium: 133 mmol/L — ABNORMAL LOW (ref 135–145)

## 2024-10-09 MED ORDER — OXYCODONE HCL 5 MG PO TABS
5.0000 mg | ORAL_TABLET | ORAL | 0 refills | Status: AC | PRN
  Filled 2024-10-09: qty 7, 2d supply, fill #0

## 2024-10-09 MED ORDER — AMLODIPINE BESYLATE 10 MG PO TABS: 10.0000 mg | ORAL_TABLET | Freq: Every day | ORAL | 0 refills | Status: DC

## 2024-10-09 MED ORDER — ROSUVASTATIN CALCIUM 20 MG PO TABS: 20.0000 mg | ORAL_TABLET | Freq: Every day | ORAL | 0 refills | Status: DC

## 2024-10-09 MED FILL — Heparin Sodium (Porcine) Inj 1000 Unit/ML: INTRAMUSCULAR | Qty: 10 | Status: AC

## 2024-10-09 NOTE — Progress Notes (Signed)
  Progress Note  Patient Name: Katherine Browning Date of Encounter: 10/09/2024 [facility] Cardiologist: None   Interval Summary   No events overnight. This morning, patient is complaining of right wrist pain. Denies CP or dyspnea.   Vital Signs Vitals:   10/08/24 2333 10/09/24 0330 10/09/24 0730 10/09/24 0801  BP: 114/72 132/88 134/83   Pulse: 76 73 74   Resp: 19 18 19    Temp: 97.9 F (36.6 C)   98.4 F (36.9 C)  TempSrc: Axillary   Oral  SpO2: 94% 94% 96%   Weight:      Height:        Intake/Output Summary (Last 24 hours) at 10/09/2024 0832 Last data filed at 10/08/2024 2327 Gross per 24 hour  Intake 1614.81 ml  Output 700 ml  Net 914.81 ml      10/06/2024    4:12 PM 10/05/2024    1:12 PM 09/02/2024    3:36 PM  Last 3 Weights  Weight (lbs) 351 lb 363 lb 1.6 oz 363 lb  Weight (kg) 159.213 kg 164.7 kg 164.656 kg      Telemetry/ECG  NSR - Personally Reviewed  Physical Exam  GEN: No acute distress.   Neck: No JVD Cardiac: RRR, no murmurs, rubs, or gallops.  Respiratory: Clear to auscultation bilaterally. GI: Soft, nontender, non-distended  Katherine: No edema; R wrist w/o edema, erythema and distal pulse intact  Assessment & Plan  Katherine Browning is a 74 yoM with Hx of HTN and obesity who presented with CP, troponin elevation (up to 282) and BP 204/112 (in setting of missing HTZ for a couple of days), and admitted for NSTEMI and BP control.   #NSTEMI, likely type II from HTN #HTN emergency #Severe MAC -  Denies any CP or dyspnea. Reporting wrist pain but no erythema, edema and distal pulse intact. Recommend pain control w/ tylenol  and NSAIDs. - LHC w/o CAD--> trop elevation likely 2/2 HTN - TTE with EF 65-70%, GIIDD and severe MAC - Start amlodipine  10 mg on discharge - Cont HTZ 25 and rosuvastatin 20 - D/c metop and ASA given no CAD on LHC - Unclear as to the etiology of her severe MAC as such a young age; no Hx of CKD or hypercalcemia - We will sign off;  feel free to call us  back if you have any questions  For questions or updates, please contact [facility] Please consult [email or web address] for contact info under   Signed, [name] Ren Donley, MD

## 2024-10-09 NOTE — Progress Notes (Signed)
 Dr.Azobou notified and in room to see pt.due to her complaint of 10/10 pain in right wrist/hand which is also swollen.

## 2024-10-09 NOTE — Discharge Summary (Signed)
 Physician Discharge Summary  Katherine Browning FMW:[id number] DOB: 11-11-1966 DOA: 10/05/2024  PCP: Rollene Almarie LABOR, MD  Admit date: 10/05/2024  Discharge date: 10/09/2024  Admitted From: Home  Disposition:  Home  Recommendations for Outpatient Follow-up:  Follow up with PCP in 1-2 weeks. Please obtain BMP/CBC in one week. Advised to follow-up with Cardiology as scheduled. Advised to take amlodipine  10 mg daily for hypertension. Advised to take Crestor  20 mg daily for hyperlipidemia. Patient has a left heart cath which was negative for coronary artery disease.  Home Health: None Equipment/Devices:None  Discharge Condition: Stable CODE STATUS:Full code Diet recommendation: Heart Healthy   Brief [facility] Course: This 58 year old Female with PMH significant for gout, hypertension, OSA on CPAP, insulin resistant, morbid obesity presents in the ED complaining of chest pain.  Evaluation in the ED , EKG consistent with sinus rhythm, elevation of troponin up to 200, hypertensive emergency with  blood pressure 204/112.  Of note patient reported that after she burped her chest pain resolved in the ED. She missed the dose of hydrochlorothiazide  for a few days.  Also clonidine  was stopped and she was started on hydrochlorothiazide . Patient was admitted for NSTEMI, started on heparin , She will need LHC. Cardiology is following.  Left heart cath was completely unremarkable.  No coronary artery disease noted.  Aspirin  was discontinued. Patient cleared from cardiology to be discharged on Crestor  and amlodipine  for hypertension.   Discharge Diagnoses:  Principal Problem:   Chest pain Active Problems:   Gout   GERD (gastroesophageal reflux disease)   NSTEMI (non-ST elevated myocardial infarction) (HCC)   Hypertensive emergency  Chest pain, with elevated troponin:  NSTEMI: Patient presented with chest pain , now improved. Continued on heparin  gtt , Cardiology consulted, planning  for LHC,once renal functions improve. Echo showed LVEF 65-70% GIIDD Continue ASA, heparin  gtt, Crestor  20 mg. Coreg 6.25 mg bid, Hydralazine  25 mg TID. Hold ARB due to AKI. Left Heart cath unremarkable, no CAD noted.  Aspirin  discontinued.   Hypertensive emergency: Continue Norvasc , Coreg, stop metoprolol . -PRN hydralazine  and Labetalol .  -BP improved.   Gout: Continue Allopurinol .    GERD: Continue PPI.   AKI : > Improving  Baseline serum creatinine normal. Serum creatinine slightly up.1.43.>1.09 Avoid nephrotoxic medications.   Continue IV hydration.  Discharge Instructions  Discharge Instructions     Call MD for:  difficulty breathing, headache or visual disturbances   Complete by: As directed    Call MD for:  persistant dizziness or light-headedness   Complete by: As directed    Call MD for:  persistant nausea and vomiting   Complete by: As directed    Diet - low sodium heart healthy   Complete by: As directed    Diet Carb Modified   Complete by: As directed    Discharge instructions   Complete by: As directed    Advised to follow-up with primary care physician in 1 week. Advised to follow-up with cardiology as scheduled. Advised to take amlodipine  10 mg daily for hypertension Advised to take Crestor  20 mg daily for hyperlipidemia. Patient has a left heart cath which was negative for coronary artery disease.   Increase activity slowly   Complete by: As directed       Allergies as of 10/09/2024       Reactions   Ivp Dye [iodinated Contrast Media] Hives   Biaxin [clarithromycin] Hives, Nausea And Vomiting   Iodine Hives   Lisinopril  Swelling   Vicodin [hydrocodone -acetaminophen ] Nausea And Vomiting  Medication List     STOP taking these medications    Zepbound 2.5 MG/0.5ML Pen Generic drug: tirzepatide   Zepbound 5 MG/0.5ML Pen Generic drug: tirzepatide   Zepbound 7.5 MG/0.5ML Pen Generic drug: tirzepatide       TAKE these  medications    allopurinol  300 MG tablet Commonly known as: ZYLOPRIM  Take 1 tablet (300 mg total) by mouth daily.   amLODipine  10 MG tablet Commonly known as: NORVASC  Take 1 tablet (10 mg total) by mouth daily.   colchicine  0.6 MG tablet TAKE 1 TABLET(0.6 MG) BY MOUTH DAILY AS NEEDED   cyclobenzaprine  10 MG tablet Commonly known as: FLEXERIL  Take 1 tablet (10 mg total) by mouth 2 (two) times daily. What changed:  when to take this reasons to take this   hydrochlorothiazide 25 MG tablet Commonly known as: HYDRODIURIL Take 1 tablet (25 mg total) by mouth daily.   oxyCODONE  5 MG immediate release tablet Commonly known as: Oxy IR/ROXICODONE  Take 1 tablet (5 mg total) by mouth every 4 (four) hours as needed for up to 3 days for severe pain (pain score 7-10) or moderate pain (pain score 4-6).   pantoprazole  40 MG tablet Commonly known as: PROTONIX  Take 1 tablet (40 mg total) by mouth daily.   rosuvastatin 20 MG tablet Commonly known as: CRESTOR Take 1 tablet (20 mg total) by mouth daily. Start taking on: October 10, 2024        Follow-up Information     Rollene Almarie LABOR, MD Follow up in 1 week(s).   Specialty: Internal Medicine Contact information: [address] [place] [address] [phone]                Allergies  Allergen Reactions   Ivp Dye [Iodinated Contrast Media] Hives   Biaxin [Clarithromycin] Hives and Nausea And Vomiting   Iodine Hives   Lisinopril  Swelling   Vicodin [Hydrocodone -Acetaminophen ] Nausea And Vomiting    Consultations: Cardiology   Procedures/Studies: CARDIAC CATHETERIZATION Result Date: 10/08/2024   LV end diastolic pressure is normal. Normal coronary anatomy Normal LVEDP 9 mm Hg Plan: management of HTN   ECHOCARDIOGRAM COMPLETE Result Date: 10/06/2024    ECHOCARDIOGRAM REPORT   Patient Name:   Katherine Browning Date of Exam: 10/06/2024 Medical Rec #:  [id number]     Height:       65.0 in Accession #:     [id number]    Weight:       363.1 lb Date of Birth:  04-25-1966     BSA:          2.549 m Patient Age:    58 years      BP:           163/84 mmHg Patient Gender: F             HR:           74 bpm. Exam Location:  Inpatient Procedure: 2D Echo and Strain Analysis (Both Spectral and Color Flow Doppler            were utilized during procedure). Indications:    Elevated Troponin  History:        Patient has no prior history of Echocardiogram examinations.  Sonographer:    Charmaine Gaskins Referring Phys: [id number] RATHORE  Sonographer Comments: Image acquisition challenging due to patient body habitus. IMPRESSIONS  1. Left ventricular ejection fraction, by estimation, is 65 to 70%. The left ventricle has normal function. The left ventricle has no regional  wall motion abnormalities. There is mild left ventricular hypertrophy. Left ventricular diastolic parameters are consistent with Grade II diastolic dysfunction (pseudonormalization). Elevated left atrial pressure. The average left ventricular global longitudinal strain is -21.5 %. The global longitudinal strain is normal.  2. Right ventricular systolic function is normal. The right ventricular size is normal. Tricuspid regurgitation signal is inadequate for assessing PA pressure.  3. Left atrial size was moderately dilated.  4. The mitral valve is degenerative. Trivial mitral valve regurgitation. Mild mitral stenosis. The mean mitral valve gradient is 5.0 mmHg with average heart rate of 91 bpm. Severe mitral annular calcification.  5. The aortic valve was not well visualized. Aortic valve regurgitation is not visualized. No aortic stenosis is present. FINDINGS  Left Ventricle: Left ventricular ejection fraction, by estimation, is 65 to 70%. The left ventricle has normal function. The left ventricle has no regional wall motion abnormalities. The average left ventricular global longitudinal strain is -21.5 %. Strain was performed and the global longitudinal strain  is normal. The left ventricular internal cavity size was normal in size. There is mild left ventricular hypertrophy. Left ventricular diastolic parameters are consistent with Grade II diastolic dysfunction (pseudonormalization). Elevated left atrial pressure. Right Ventricle: The right ventricular size is normal. Right vetricular wall thickness was not well visualized. Right ventricular systolic function is normal. Tricuspid regurgitation signal is inadequate for assessing PA pressure. Left Atrium: Left atrial size was moderately dilated. Right Atrium: Right atrial size was normal in size. Pericardium: There is no evidence of pericardial effusion. Mitral Valve: The mitral valve is degenerative in appearance. Severe mitral annular calcification. Trivial mitral valve regurgitation. Mild mitral valve stenosis. MV peak gradient, 10.6 mmHg. The mean mitral valve gradient is 5.0 mmHg with average heart rate of 91 bpm. Tricuspid Valve: The tricuspid valve is normal in structure. Tricuspid valve regurgitation is trivial. Aortic Valve: The aortic valve was not well visualized. Aortic valve regurgitation is not visualized. No aortic stenosis is present. Pulmonic Valve: The pulmonic valve was not well visualized. Pulmonic valve regurgitation is not visualized. Aorta: The aortic root and ascending aorta are structurally normal, with no evidence of dilitation. IAS/Shunts: The interatrial septum was not well visualized.  LEFT VENTRICLE PLAX 2D LVIDd:         4.20 cm   Diastology LVIDs:         2.40 cm   LV e' medial:    6.09 cm/s LV PW:         1.30 cm   LV E/e' medial:  20.0 LV IVS:        1.20 cm   LV e' lateral:   6.53 cm/s LVOT diam:     2.20 cm   LV E/e' lateral: 18.7 LVOT Area:     3.80 cm                          2D Longitudinal Strain                          2D Strain GLS Avg:     -21.5 % RIGHT VENTRICLE RV Basal diam:  2.80 cm RV Mid diam:    2.50 cm TAPSE (M-mode): 3.0 cm LEFT ATRIUM              Index        RIGHT  ATRIUM           Index LA diam:  4.30 cm  1.69 cm/m   RA Area:     11.00 cm LA Vol (A2C):   105.0 ml 41.20 ml/m  RA Volume:   20.20 ml  7.93 ml/m LA Vol (A4C):   97.5 ml  38.25 ml/m LA Biplane Vol: 106.0 ml 41.59 ml/m   AORTA Ao Asc diam: 3.50 cm MITRAL VALVE MV Area (PHT): 3.20 cm     SHUNTS MV Peak grad:  10.6 mmHg    Systemic Diam: 2.20 cm MV Mean grad:  5.0 mmHg MV Vmax:       1.63 m/s MV Vmean:      105.0 cm/s MV Decel Time: 237 msec MV E velocity: 122.00 cm/s MV A velocity: 138.00 cm/s MV E/A ratio:  0.88 Lonni Nanas MD Electronically signed by Lonni Nanas MD Signature Date/Time: 10/06/2024/4:13:34 PM    Final    DG Chest Portable 1 View Result Date: 10/05/2024 CLINICAL DATA:  Central chest pain radiating to right side EXAM: PORTABLE CHEST 1 VIEW COMPARISON:  05/21/2013 FINDINGS: Single frontal view of the chest demonstrates an unremarkable cardiac silhouette. No acute airspace disease, effusion, or pneumothorax. No acute bony abnormalities. IMPRESSION: 1. No acute intrathoracic process. Electronically Signed   By: Ozell Daring M.D.   On: 10/05/2024 15:12   LHC    Subjective: Patient was seen and examined at bedside.  Overnight events noted. Patient reports chest pain has resolved.  Left heart cath was unremarkable.   Patient want to be discharged home.  Discharge Exam: Vitals:   10/09/24 0801 10/09/24 1155  BP:  (!) 144/84  Pulse:    Resp:    Temp: 98.4 F (36.9 C)   SpO2:     Vitals:   10/09/24 0330 10/09/24 0730 10/09/24 0801 10/09/24 1155  BP: 132/88 134/83  (!) 144/84  Pulse: 73 74    Resp: 18 19    Temp:   98.4 F (36.9 C)   TempSrc:   Oral   SpO2: 94% 96%    Weight:      Height:        General: Pt is alert, awake, not in acute distress Cardiovascular: RRR, S1/S2 +, no rubs, no gallops Respiratory: CTA bilaterally, no wheezing, no rhonchi Abdominal: Soft, NT, ND, bowel sounds + Extremities: no edema, no cyanosis    The results  of significant diagnostics from this hospitalization (including imaging, microbiology, ancillary and laboratory) are listed below for reference.     Microbiology: No results found for this or any previous visit (from the past 240 hours).   Labs: BNP (last 3 results) No results for input(s): BNP in the last 8760 hours. Basic Metabolic Panel: Recent Labs  Lab 10/05/24 1530 10/07/24 0545 10/08/24 0803 10/09/24 0430  NA 141 134* 135 133*  K 4.4 4.0 4.2 4.6  CL 104 99 98 100  CO2 25 23 23  19*  GLUCOSE 105* 114* 103* 142*  BUN 14 16 23* 22*  CREATININE 1.01* 1.43* 1.09* 1.13*  CALCIUM 10.0 9.4 9.6 9.4   Liver Function Tests: No results for input(s): AST, ALT, ALKPHOS, BILITOT, PROT, ALBUMIN in the last 168 hours. No results for input(s): LIPASE, AMYLASE in the last 168 hours. No results for input(s): AMMONIA in the last 168 hours. CBC: Recent Labs  Lab 10/05/24 1530 10/06/24 0531 10/07/24 0545 10/08/24 0607 10/09/24 0430  WBC 7.0 6.5 7.4 7.2 8.3  NEUTROABS 4.5  --   --   --   --   HGB 14.2 12.7 13.5 14.5  13.7  HCT 43.7 39.6 40.6 44.0 41.2  MCV 93.4 92.5 89.8 90.5 90.0  PLT 240 231 242 236 256   Cardiac Enzymes: No results for input(s): CKTOTAL, CKMB, CKMBINDEX, TROPONINI in the last 168 hours. BNP: Invalid input(s): POCBNP CBG: No results for input(s): GLUCAP in the last 168 hours. D-Dimer No results for input(s): DDIMER in the last 72 hours. Hgb A1c No results for input(s): HGBA1C in the last 72 hours. Lipid Profile No results for input(s): CHOL, HDL, LDLCALC, TRIG, CHOLHDL, LDLDIRECT in the last 72 hours. Thyroid  function studies No results for input(s): TSH, T4TOTAL, T3FREE, THYROIDAB in the last 72 hours.  Invalid input(s): FREET3 Anemia work up No results for input(s): VITAMINB12, FOLATE, FERRITIN, TIBC, IRON, RETICCTPCT in the last 72 hours. Urinalysis    Component Value Date/Time    COLORURINE YELLOW 09/02/2024 1608   APPEARANCEUR CLEAR 09/02/2024 1608   LABSPEC 1.015 09/02/2024 1608   PHURINE 6.5 09/02/2024 1608   GLUCOSEU NEGATIVE 09/02/2024 1608   HGBUR NEGATIVE 09/02/2024 1608   BILIRUBINUR NEGATIVE 09/02/2024 1608   KETONESUR NEGATIVE 09/02/2024 1608   PROTEINUR NEGATIVE 06/03/2012 2114   UROBILINOGEN 0.2 09/02/2024 1608   NITRITE NEGATIVE 09/02/2024 1608   LEUKOCYTESUR TRACE (A) 09/02/2024 1608   Sepsis Labs Recent Labs  Lab 10/06/24 0531 10/07/24 0545 10/08/24 0607 10/09/24 0430  WBC 6.5 7.4 7.2 8.3   Microbiology No results found for this or any previous visit (from the past 240 hours).   Time coordinating discharge: Over 30 minutes  SIGNED:   Darcel Dawley, MD  [facility] 10/09/2024, 4:08 PM Pager   If 7PM-7AM, please contact night-coverage

## 2024-10-09 NOTE — Progress Notes (Addendum)
 Dr.Pardeep notified that pt.had some relief from pain after taking oxycodone  10mg , however pt.states pain is starting to ''come back''  Dr.Pardeep notified no pain meds ordered for discharge. MD to put in for a few days of pain medication. Also MD asked what to inform pt.to do if pain does not go away. Awaiting response.

## 2024-10-09 NOTE — TOC Transition Note (Signed)
 Transition of Care [facility]) - Discharge Note   Patient Details  Name: Katherine Browning MRN: [id number] Date of Birth: [birth year]  Transition of Care [facility]) CM/SW Contact:  Sudie Erminio Deems, RN Phone Number: 10/09/2024, 12:20 PM   Clinical Narrative:  Patient plans to discharge home today. ICM received a consult for medication assistance. Patient has [employer] and states she can afford medications that she is currently taking. Patient states she did not start Zepbound  because it was too expensive. Patient states she was unable to pay for her CPAP machine and ICM asked her to see if she can speak with [facility] to see if the office could send the referral to another DME provider. No further needs identified at this time.   Social Drivers of Health (SDOH) Interventions SDOH Screenings   Food Insecurity: Food Insecurity Present (10/06/2024)  Housing: Low Risk  (10/06/2024)  Transportation Needs: No Transportation Needs (10/06/2024)  Utilities: Not At Risk (10/06/2024)  Depression (PHQ2-9): Low Risk  (09/02/2024)  Tobacco Use: Low Risk  (10/06/2024)   Readmission Risk Interventions     No data to display

## 2024-10-09 NOTE — Plan of Care (Signed)

## 2024-10-09 NOTE — Progress Notes (Signed)
 Discharge   Patient expressed verbal understanding of discharge POC.   Patient given time to ask any questions.  Additional education included in AVS.  Alert oriented in good spirits.   Tele and PIV removed.  Pressure dressings intact.  CCMD/SHEA Ride on the way.   Discharging to Main A.

## 2024-10-09 NOTE — Plan of Care (Signed)
 Problem: Education: Goal: Knowledge of General Education information will improve Description: Including pain rating scale, medication(s)/side effects and non-pharmacologic comfort measures 10/09/2024 1149 by Marylu Randine NOVAK, RN Outcome: Adequate for Discharge 10/09/2024 1149 by Marylu Randine NOVAK, RN Outcome: Progressing   Problem: Health Behavior/Discharge Planning: Goal: Ability to manage health-related needs will improve 10/09/2024 1149 by Marylu Randine NOVAK, RN Outcome: Adequate for Discharge 10/09/2024 1149 by Marylu Randine NOVAK, RN Outcome: Progressing   Problem: Clinical Measurements: Goal: Ability to maintain clinical measurements within normal limits will improve 10/09/2024 1149 by Marylu Randine NOVAK, RN Outcome: Adequate for Discharge 10/09/2024 1149 by Marylu Randine NOVAK, RN Outcome: Progressing Goal: Will remain free from infection 10/09/2024 1149 by Marylu Randine NOVAK, RN Outcome: Adequate for Discharge 10/09/2024 1149 by Marylu Randine NOVAK, RN Outcome: Progressing Goal: Diagnostic test results will improve 10/09/2024 1149 by Marylu Randine NOVAK, RN Outcome: Adequate for Discharge 10/09/2024 1149 by Marylu Randine NOVAK, RN Outcome: Progressing Goal: Respiratory complications will improve 10/09/2024 1149 by Marylu Randine NOVAK, RN Outcome: Adequate for Discharge 10/09/2024 1149 by Marylu Randine NOVAK, RN Outcome: Progressing Goal: Cardiovascular complication will be avoided 10/09/2024 1149 by Marylu Randine NOVAK, RN Outcome: Adequate for Discharge 10/09/2024 1149 by Marylu Randine NOVAK, RN Outcome: Progressing   Problem: Activity: Goal: Risk for activity intolerance will decrease 10/09/2024 1149 by Marylu Randine NOVAK, RN Outcome: Adequate for Discharge 10/09/2024 1149 by Marylu Randine NOVAK, RN Outcome: Progressing   Problem: Nutrition: Goal: Adequate nutrition will be maintained 10/09/2024 1149 by Marylu Randine NOVAK, RN Outcome: Adequate for Discharge 10/09/2024 1149 by Marylu Randine NOVAK, RN Outcome:  Progressing   Problem: Coping: Goal: Level of anxiety will decrease 10/09/2024 1149 by Marylu Randine NOVAK, RN Outcome: Adequate for Discharge 10/09/2024 1149 by Marylu Randine NOVAK, RN Outcome: Progressing   Problem: Elimination: Goal: Will not experience complications related to bowel motility 10/09/2024 1149 by Marylu Randine NOVAK, RN Outcome: Adequate for Discharge 10/09/2024 1149 by Marylu Randine NOVAK, RN Outcome: Progressing Goal: Will not experience complications related to urinary retention 10/09/2024 1149 by Marylu Randine NOVAK, RN Outcome: Adequate for Discharge 10/09/2024 1149 by Marylu Randine NOVAK, RN Outcome: Progressing   Problem: Pain Managment: Goal: General experience of comfort will improve and/or be controlled 10/09/2024 1149 by Marylu Randine NOVAK, RN Outcome: Adequate for Discharge 10/09/2024 1149 by Marylu Randine NOVAK, RN Outcome: Progressing   Problem: Safety: Goal: Ability to remain free from injury will improve 10/09/2024 1149 by Marylu Randine NOVAK, RN Outcome: Adequate for Discharge 10/09/2024 1149 by Marylu Randine NOVAK, RN Outcome: Progressing   Problem: Skin Integrity: Goal: Risk for impaired skin integrity will decrease 10/09/2024 1149 by Marylu Randine NOVAK, RN Outcome: Adequate for Discharge 10/09/2024 1149 by Marylu Randine NOVAK, RN Outcome: Progressing   Problem: Education: Goal: Understanding of CV disease, CV risk reduction, and recovery process will improve 10/09/2024 1149 by Marylu Randine NOVAK, RN Outcome: Adequate for Discharge 10/09/2024 1149 by Marylu Randine NOVAK, RN Outcome: Progressing Goal: Individualized Educational Video(s) 10/09/2024 1149 by Marylu Randine NOVAK, RN Outcome: Adequate for Discharge 10/09/2024 1149 by Marylu Randine NOVAK, RN Outcome: Progressing   Problem: Activity: Goal: Ability to return to baseline activity level will improve 10/09/2024 1149 by Marylu Randine NOVAK, RN Outcome: Adequate for Discharge 10/09/2024 1149 by Marylu Randine NOVAK, RN Outcome:  Progressing   Problem: Cardiovascular: Goal: Ability to achieve and maintain adequate cardiovascular perfusion will improve 10/09/2024 1149 by Marylu Randine NOVAK, RN Outcome: Adequate for Discharge 10/09/2024 1149 by Marylu Randine NOVAK, RN Outcome: Progressing  Goal: Vascular access site(s) Level 0-1 will be maintained 10/09/2024 1149 by Marylu Randine NOVAK, RN Outcome: Adequate for Discharge 10/09/2024 1149 by Marylu Randine NOVAK, RN Outcome: Progressing   Problem: Health Behavior/Discharge Planning: Goal: Ability to safely manage health-related needs after discharge will improve 10/09/2024 1149 by Marylu Randine NOVAK, RN Outcome: Adequate for Discharge 10/09/2024 1149 by Marylu Randine NOVAK, RN Outcome: Progressing

## 2024-10-10 ENCOUNTER — Telehealth: Payer: Self-pay

## 2024-10-10 NOTE — Transitions of Care (Post Inpatient/ED Visit) (Signed)
   10/10/2024  Name: Katherine Browning MRN: [id number] DOB: [birth year]  Today's TOC FU Call Status: Today's TOC FU Call Status:: Successful TOC FU Call Completed TOC FU Call Complete Date: 10/10/24  Patient's Name and Date of Birth confirmed. Name, DOB  Transition Care Management Follow-up Telephone Call Date of Discharge: 10/09/24 Discharge Facility: [facility]) Type of Discharge: Inpatient Admission Primary Inpatient Discharge Diagnosis:: NSTEMI How have you been since you were released from the hospital?: Better Any questions or concerns?: No  Items Reviewed: Did you receive and understand the discharge instructions provided?: Yes Medications obtained,verified, and reconciled?: Yes (Medications Reviewed) Any new allergies since your discharge?: No Dietary orders reviewed?: Yes Do you have support at home?: No  Medications Reviewed Today: Medications Reviewed Today     Reviewed by Emmitt Pan, LPN (Licensed Practical Nurse) on 10/10/24 at 0932  Med List Status: <None>   Medication Order Taking? Sig Documenting Provider Last Dose Status Informant  allopurinol  (ZYLOPRIM ) 300 MG tablet [id number] Yes Take 1 tablet (300 mg total) by mouth daily. Rollene Almarie LABOR, MD  Active Self, Pharmacy Records  amLODipine  (NORVASC ) 10 MG tablet [id number] Yes Take 1 tablet (10 mg total) by mouth daily. Leotis Bogus, MD  Active   colchicine  0.6 MG tablet [id number] Yes TAKE 1 TABLET(0.6 MG) BY MOUTH DAILY AS NEEDED Rollene Almarie LABOR, MD  Active Self, Pharmacy Records  cyclobenzaprine  (FLEXERIL ) 10 MG tablet [id number] Yes Take 1 tablet (10 mg total) by mouth 2 (two) times daily.  Patient taking differently: Take 10 mg by mouth 3 (three) times daily as needed for muscle spasms.   Rollene Almarie LABOR, MD  Active Self, Pharmacy Records  hydrochlorothiazide (HYDRODIURIL) 25 MG tablet [id number] Yes Take 1 tablet (25 mg total) by mouth daily. Rollene Almarie LABOR, MD  Active Self,  Pharmacy Records  oxyCODONE  (OXY IR/ROXICODONE ) 5 MG immediate release tablet [id number] Yes Take 1 tablet (5 mg total) by mouth every 4 (four) hours as needed for up to 3 days for severe pain (pain score 7-10) or moderate pain (pain score 4-6). Leotis Bogus, MD  Active   pantoprazole  (PROTONIX ) 40 MG tablet [id number] Yes Take 1 tablet (40 mg total) by mouth daily. Rollene Almarie LABOR, MD  Active Self, Pharmacy Records  rosuvastatin (CRESTOR) 20 MG tablet [id number] Yes Take 1 tablet (20 mg total) by mouth daily. Leotis Bogus, MD  Active             Home Care and Equipment/Supplies: Were Home Health Services Ordered?: NA Any new equipment or medical supplies ordered?: NA  Functional Questionnaire: Do you need assistance with bathing/showering or dressing?: No Do you need assistance with meal preparation?: No Do you need assistance with eating?: No Do you have difficulty maintaining continence: No Do you need assistance with getting out of bed/getting out of a chair/moving?: No Do you have difficulty managing or taking your medications?: No  Follow up appointments reviewed: PCP Follow-up appointment confirmed?: Yes Date of PCP follow-up appointment?: 10/15/24 Follow-up Provider: [facility] Follow-up appointment confirmed?: Yes Date of Specialist follow-up appointment?: 10/25/24 Follow-Up Specialty Provider:: cardio Do you need transportation to your follow-up appointment?: No Do you understand care options if your condition(s) worsen?: Yes-patient verbalized understanding    SIGNATURE Pan Emmitt, LPN [facility] Nurse Health Advisor Direct Dial [phone]

## 2024-10-11 LAB — LIPOPROTEIN A (LPA): Lipoprotein (a): 111.7 nmol/L — ABNORMAL HIGH (ref <75.0)

## 2024-10-15 ENCOUNTER — Ambulatory Visit: Admitting: Internal Medicine

## 2024-10-15 ENCOUNTER — Encounter: Payer: Self-pay | Admitting: Internal Medicine

## 2024-10-15 VITALS — BP 130/70 | HR 79 | Temp 98.4°F | Ht 65.0 in | Wt 347.0 lb

## 2024-10-15 DIAGNOSIS — R7303 Prediabetes: Secondary | ICD-10-CM | POA: Diagnosis not present

## 2024-10-15 DIAGNOSIS — M79601 Pain in right arm: Secondary | ICD-10-CM

## 2024-10-15 DIAGNOSIS — G4733 Obstructive sleep apnea (adult) (pediatric): Secondary | ICD-10-CM

## 2024-10-15 DIAGNOSIS — M0579 Rheumatoid arthritis with rheumatoid factor of multiple sites without organ or systems involvement: Secondary | ICD-10-CM

## 2024-10-15 DIAGNOSIS — I10 Essential (primary) hypertension: Secondary | ICD-10-CM

## 2024-10-15 LAB — CBC
HCT: 42.1 % (ref 36.0–46.0)
Hemoglobin: 14.1 g/dL (ref 12.0–15.0)
MCHC: 33.5 g/dL (ref 30.0–36.0)
MCV: 90.7 fl (ref 78.0–100.0)
Platelets: 289 K/uL (ref 150.0–400.0)
RBC: 4.65 Mil/uL (ref 3.87–5.11)
RDW: 16 % — ABNORMAL HIGH (ref 11.5–15.5)
WBC: 7.2 K/uL (ref 4.0–10.5)

## 2024-10-15 LAB — COMPREHENSIVE METABOLIC PANEL WITH GFR
ALT: 15 U/L (ref 0–35)
AST: 13 U/L (ref 0–37)
Albumin: 4.5 g/dL (ref 3.5–5.2)
Alkaline Phosphatase: 84 U/L (ref 39–117)
BUN: 14 mg/dL (ref 6–23)
CO2: 30 meq/L (ref 19–32)
Calcium: 10.2 mg/dL (ref 8.4–10.5)
Chloride: 100 meq/L (ref 96–112)
Creatinine, Ser: 1.23 mg/dL — ABNORMAL HIGH (ref 0.40–1.20)
GFR: 49.03 mL/min — ABNORMAL LOW (ref >60.00)
Glucose, Bld: 102 mg/dL — ABNORMAL HIGH (ref 70–99)
Potassium: 3.9 meq/L (ref 3.5–5.1)
Sodium: 137 meq/L (ref 135–145)
Total Bilirubin: 0.5 mg/dL (ref 0.2–1.2)
Total Protein: 9 g/dL — ABNORMAL HIGH (ref 6.0–8.3)

## 2024-10-15 MED ORDER — CONTRAVE 8-90 MG PO TB12: ORAL_TABLET | ORAL | 0 refills | Status: AC

## 2024-10-15 MED ORDER — PANTOPRAZOLE SODIUM 40 MG PO TBEC: 40.0000 mg | DELAYED_RELEASE_TABLET | Freq: Every day | ORAL | 3 refills | Status: AC

## 2024-10-15 MED ORDER — ALLOPURINOL 300 MG PO TABS: 300.0000 mg | ORAL_TABLET | Freq: Every day | ORAL | 3 refills | Status: AC

## 2024-10-15 NOTE — Progress Notes (Signed)
 "  Subjective:   Patient ID: Katherine Browning, female    DOB: 09-26-1966, 58 y.o.   MRN: [id number]  Discussed the use of AI scribe software for clinical note transcription with the patient, who gave verbal consent to proceed.  History of Present Illness Katherine Browning is a 58 year old female with hypertension who presents with concerns about recent medication changes and post-hospitalization symptoms.  She was recently hospitalized and diagnosed with a myocardial infarction. During her hospital stay, her blood pressure was significantly elevated, leading to adjustments in her medication regimen. She is currently taking amlodipine  10 mg and hydrochlorothiazide  but is uncertain about the discontinuation of clonidine  and metoprolol . Her blood pressure was managed with hydralazine  injections during hospitalization.  Post-discharge, she experiences chronic fatigue and significant pain in her left hand, described as 'killing me,' with radiation up her arm and associated with a 'little knot' in the area. She also notes pressure in her groin, particularly when walking or coughing, which she attributes to the catheterization procedure performed during her hospitalization. She took Tylenol  prior to the visit for pain management.  She denies current chest pain but reports visual disturbances, such as 'zigzags' or 'silver streaks,' similar to those experienced during her myocardial infarction. She takes all her medications in the morning and has not experienced any new gastrointestinal symptoms, though she recalls gas pains and discomfort in her stomach and shoulder prior to her heart attack.  Her glucose levels were elevated during her hospital stay, despite previous improvement. She is concerned about managing her prediabetes and is exploring alternative treatments for weight and glucose control, as she did not fill her prescription for Zetdown due to cost. She previously tried metformin  for prediabetes.  She is  currently on medical leave from work and requires documentation to extend her leave until after her upcoming cardiology appointment on December 5th. She feels that returning to work is not feasible at this time due to her current health status.  Review of Systems  Constitutional: Negative.   HENT: Negative.    Eyes: Negative.   Respiratory:  Negative for cough, chest tightness and shortness of breath.   Cardiovascular:  Negative for chest pain, palpitations and leg swelling.  Gastrointestinal:  Negative for abdominal distention, abdominal pain, constipation, diarrhea, nausea and vomiting.  Musculoskeletal:  Positive for myalgias.  Skin: Negative.   Neurological: Negative.   Psychiatric/Behavioral: Negative.      Objective:  Physical Exam Constitutional:      Appearance: She is well-developed. She is obese.  HENT:     Head: Normocephalic and atraumatic.  Cardiovascular:     Rate and Rhythm: Normal rate and regular rhythm.  Pulmonary:     Effort: Pulmonary effort is normal. No respiratory distress.     Breath sounds: Normal breath sounds. No wheezing or rales.  Abdominal:     General: Bowel sounds are normal. There is no distension.     Palpations: Abdomen is soft.     Tenderness: There is no abdominal tenderness.  Musculoskeletal:     Cervical back: Normal range of motion.     Comments: Pain right wrist and right groin at cath site without bleeding.   Skin:    General: Skin is warm and dry.  Neurological:     Mental Status: She is alert and oriented to person, place, and time.     Coordination: Coordination normal.     Vitals:   10/15/24 1417  BP: 130/70  Pulse: 79  Temp: 98.4 F (  36.9 C)  TempSrc: Oral  SpO2: 97%  Weight: (!) 347 lb (157.4 kg)  Height: 5' 5 (1.651 m)  I personally spent a total of 45 minutes in the care of the patient today including preparing to see the patient, performing a medically appropriate exam/evaluation, counseling and educating, and  placing orders.   Assessment and Plan Assessment & Plan Recent myocardial infarction with post-infarction fatigue   She experiences ongoing fatigue likely related to the recent cardiac event. A work note was provided for medical leave until December 8th, 2025. Follow up with the cardiologist is scheduled for December 5th, 2025.  Essential hypertension   Hypertension management was adjusted during hospitalization. She is currently on amlodipine  and hydrochlorothiazide . Continue amlodipine  10 mg daily and hydrochlorothiazide  as prescribed. Blood pressure should be monitored regularly.  Pain and swelling of right arm and pain and pressure in right groin following cardiac catheterization   She has pain and swelling in the right arm and pressure in the right groin post-cardiac catheterization. Improvement is expected within a month, and no bleeding has been reported. Continue to monitor symptoms and expect improvement by the next cardiology appointment.  Prediabetes   There is a recent increase in glucose levels. A previous metformin  trial was not continued due to lack of perceived efficacy.  Morbid obesity   Previous weight management attempts with metformin  and Zepbound  were unsuccessful due to cost and insurance issues. Discussed potential use of Contrave  for weight loss if covered by insurance rx done for starting month to see.   "

## 2024-10-15 NOTE — Patient Instructions (Addendum)
 We will keep you out of work until you see the heart doctor.   We will have you keep taking the same medicines for now.

## 2024-10-16 ENCOUNTER — Ambulatory Visit: Payer: Self-pay | Admitting: Internal Medicine

## 2024-10-16 NOTE — Assessment & Plan Note (Signed)
 She has pain and swelling in the right arm and pressure in the right groin post-cardiac catheterization. Improvement is expected within a month, and no bleeding has been reported. Site examined without signs of bleeding, infection and good circulation to the hand if present. Continue to monitor symptoms and expect improvement by the next cardiology appointment

## 2024-10-16 NOTE — Assessment & Plan Note (Signed)
 Needs help affording CPAP so referral to social worker placed today.

## 2024-10-16 NOTE — Assessment & Plan Note (Signed)
 Previous weight management attempts with metformin  due to inefficacy and Zepbound  were unsuccessful due to cost and insurance issues. Discussed potential use of Contrave  for weight loss if covered by insurance rx done for starting month to see.

## 2024-10-16 NOTE — Assessment & Plan Note (Signed)
 Discharge summary is not overly helpful but seems she was maintained on hydralazine  and coreg during stay then transitioned to amlodipine  and hydrochlorothiazide  on discharge. She is taking amlodipine  10 mg daily and hydrochlorothiazide  25 mg daily. BP today at goal. She is confused about why her medications were changed around during and after and before hospital stay. Checking CMP and CBC due to medication changes.

## 2024-10-16 NOTE — Assessment & Plan Note (Signed)
 Recent labs reviewed and needs referral to nutritionist to help her balance pre-diabetic diet and new heart health low sodium diet.

## 2024-10-16 NOTE — Assessment & Plan Note (Signed)
 She was diagnosed in 2023 and was unable to see rheumatologist due to cost. She is not clearly in a flare today. Given recent uncontrolled hypertension NSAIDs are not an option for treatment for her.

## 2024-10-21 ENCOUNTER — Telehealth: Payer: Self-pay | Admitting: *Deleted

## 2024-10-21 NOTE — Progress Notes (Signed)
 Complex Care Management Note  Care Guide Note 10/21/2024 Name: Elayjah Chaney MRN: [id number] DOB: 1965/12/27  Beata Beason is a 58 y.o. year old female who sees Rollene Almarie LABOR, MD for primary care. I reached out to Olam Pouch by phone today to offer complex care management services.  Ms. Jenning was given information about Complex Care Management services today including:   The Complex Care Management services include support from the care team which includes your Nurse Care Manager, Clinical Social Worker, or Pharmacist.  The Complex Care Management team is here to help remove barriers to the health concerns and goals most important to you. Complex Care Management services are voluntary, and the patient may decline or stop services at any time by request to their care team member.   Complex Care Management Consent Status: Patient agreed to services and verbal consent obtained.   Follow up plan:  Telephone appointment with complex care management team member scheduled for:  10/23/2024 and 11/19/2024  Encounter Outcome:  Patient Scheduled  Thedford Franks, CMA [place]  [facility] Guide Direct Dial: [phone]  Fax: [phone] Website: [email or web address]

## 2024-10-23 ENCOUNTER — Other Ambulatory Visit: Payer: Self-pay

## 2024-10-23 NOTE — Progress Notes (Signed)
    Cardiology Office Note Date:  10/25/2024  ID:  Katherine Browning, DOB 07/26/1966, MRN [id number] PCP:  Rollene Almarie LABOR, MD  Cardiologist:  [name] Ren Donley, MD  Chief Complaint  Patient presents with   Hypertension      Problems NSTEMI, likely type II in setting of HTN emergency LHC 11/25 w/o CAD TTE 11/25 65-70%, GIIDD and severe MAC Severe MAC Obesity Borderline Lpa 112 nmol/L LDL 111 10/25 HA1C 6.1 10/25 M: AE10, HTZ25, RN20  Visits  12/25:    History of Present Illness: Katherine Browning is a 58 y.o. female who presents for follow up:   She has been doing well since leaving the hospital.  She denies any chest pain or shortness of breath.  She has not been checking her blood pressure at home because she does not have a blood pressure cuff.  She does report that it was controlled at her clinic visit about 2 weeks ago.  She has not been exercising because she was waiting for me to clear her.  She was supposed to get on Zepbound  before she presented in the hospital but was told to stop by provider.  She also said that because could be an issue.  ROS: Please see the history of present illness. All other systems are reviewed and negative.    PHYSICAL EXAM: VS:  BP (!) 149/89   Pulse 79   Ht 5' 5 (1.651 m)   Wt (!) 351 lb (159.2 kg)   LMP 02/19/2018 Comment: neg preg test  SpO2 97%   BMI 58.41 kg/m  , BMI Body mass index is 58.41 kg/m. GEN: Well nourished, well developed, in no acute distress HEENT: normal Neck: no JVD, carotid bruits, or masses Cardiac: RRR; no murmurs, rubs, or gallops,no edema  Respiratory:  CTAB bilaterally, normal work of breathing GI: soft, nontender, nondistended, + BS Extremities: No LE edema Skin: warm and dry, no rash Neuro:  Strength and sensation are intact  Recent Labs: Reviewed  Studies: Reviewed  ASSESSMENT AND PLAN: Katherine Browning is a 58 y.o. female who presents for follow up. - Blood pressure elevated here today, though  reports adherence to her blood pressure medications.  I counseled patient on checking her blood pressure at home and will send a prescription for blood pressure cuff. - Will continue with amlodipine  and hydrochlorothiazide  for now. - She is due for lipid check in about 6 months. - I recommended that she gets back on Zepbound  and counseled her on increasing physical activity.   Signed, [name] Ren Donley, MD  10/25/2024 8:50 AM    [facility]

## 2024-10-23 NOTE — Patient Outreach (Signed)
 Social Drivers of Health  Community Resource and Care Coordination Visit Note   10/23/2024  Name: Luvenia Cranford MRN: [id number] DOB:04/28/1966  Situation: Referral received for [facility] needs assessment and assistance related to [employer] . I obtained verbal consent from Patient.  Visit completed with Patient on the phone.   Background:   SDOH Interventions Today    Flowsheet Row Most Recent Value  SDOH Interventions   Food Insecurity Interventions Community Resources Provided  Housing Interventions Community Resources Provided  Transportation Interventions Community Resources Provided  Utilities Interventions Community Resources Provided  Financial Strain Interventions Community Resources Provided     Assessment:   Goals Addressed             This Visit's Progress    BSW Goals       Current SDOH Barriers:  Transportation Limited access to food Housing barriers  Interventions: Patient interviewed and appropriate screenings performed Referred patient to community resources  Provided patient with information about [employer] Program Advised patient to [facility] application on 10/28/24 at 9am when applications open           Recommendation:   attend all scheduled provider appointments ask for help if you don't understand your health insurance benefits Reach out to resources provided   Follow Up Plan:   Patient has achieved all patient stated goals. [employer] will be closed. Patient has been provided contact information should new needs arise.   Orlean Fey, BSW [place]  [facility] Institute Social Worker, [employer] [facility] [phone]

## 2024-10-23 NOTE — Patient Instructions (Signed)
 Visit Information  Thank you for taking time to visit with me today. Please don't hesitate to contact me if I can be of assistance to you before our next scheduled appointment.  Our next appointment is no further scheduled appointments.   Please call the care guide team at [phone] if you need to cancel or reschedule your appointment.   Following is a copy of your care plan:   Goals Addressed             This Visit's Progress    BSW Goals       Current SDOH Barriers:  Transportation Limited access to food Housing barriers  Interventions: Patient interviewed and appropriate screenings performed Referred patient to community resources  Provided patient with information about [facility] Financial Assistance Program Advised patient to [facility] application on 10/28/24 at 9am when applications open           Please call the Suicide and Crisis Lifeline: 988 call the [place]  National Suicide Prevention Lifeline: 754-187-8037 or TTY: 343-040-0827 TTY 602-800-3448) to talk to a trained counselor call 1-800-273-TALK (toll free, 24 hour hotline) go to [facility] Urgent Care [address] [place] [phone]) call 911 if you are experiencing a Mental Health or Behavioral Health Crisis or need someone to talk to.  Patient verbalized understanding of Care plan and visit instructions communicated this visit  Orlean Fey, BSW [facility]  Value Based Care Institute Social Worker, [employer] [facility] [phone]

## 2024-10-25 ENCOUNTER — Ambulatory Visit

## 2024-10-25 ENCOUNTER — Other Ambulatory Visit (HOSPITAL_COMMUNITY): Payer: Self-pay

## 2024-10-25 VITALS — BP 149/89 | HR 79 | Ht 65.0 in | Wt 351.0 lb

## 2024-10-25 DIAGNOSIS — I10 Essential (primary) hypertension: Secondary | ICD-10-CM

## 2024-10-25 DIAGNOSIS — R7303 Prediabetes: Secondary | ICD-10-CM | POA: Diagnosis not present

## 2024-10-25 DIAGNOSIS — E782 Mixed hyperlipidemia: Secondary | ICD-10-CM

## 2024-10-25 MED ORDER — OMRON 3 SERIES BP MONITOR DEVI
0 refills | Status: DC
  Filled 2024-10-25: qty 1, 30d supply, fill #0

## 2024-10-25 NOTE — Patient Instructions (Signed)
 Medication Instructions:  No Changes *If you need a refill on your cardiac medications before your next appointment, please call your pharmacy*  Lab Work: FASTING (okay to drink water  and/or black coffee only) LIPID PANEL due in about six months, before returning to see Dr. Ren  due around 04/21/2025; you can go to any [facility]; no appointment needeed.  You may go to any of these [facility] locations:   [facility] - [address] ([facility]) - [id number] (1st floor, next to pharmacy)   [place] - [address]    [place]  - [address]    [place] - [address] - [address] [employer]  - [address] ([facility])  [place]   - [address], [address]   Follow-Up: At [facility], you and your health needs are our priority.  As part of our continuing mission to provide you with exceptional heart care, our providers are all part of one team.  This team includes your primary Cardiologist (physician) and Advanced Practice Providers or APPs (Physician Assistants and Nurse Practitioners) who all work together to provide you with the care you need, when you need it.  Your next appointment:   6 month(s) (Call in Feb for a June 2026 appointment)  Provider:   [name] Ren Donley, MD

## 2024-10-29 ENCOUNTER — Telehealth: Payer: Self-pay

## 2024-11-04 ENCOUNTER — Other Ambulatory Visit (HOSPITAL_COMMUNITY): Payer: Self-pay

## 2024-11-04 ENCOUNTER — Telehealth: Payer: Self-pay

## 2024-11-04 MED ORDER — ROSUVASTATIN CALCIUM 20 MG PO TABS: 20.0000 mg | ORAL_TABLET | Freq: Every day | ORAL | 3 refills | Status: AC

## 2024-11-04 MED ORDER — AMLODIPINE BESYLATE 10 MG PO TABS: 10.0000 mg | ORAL_TABLET | Freq: Every day | ORAL | 3 refills | Status: AC

## 2024-11-04 NOTE — Telephone Encounter (Signed)
°*  STAT* If patient is at the pharmacy, call can be transferred to refill team.   1. Which medications need to be refilled? (please list name of each medication and dose if known) rosuvastatin  (CRESTOR ) 20 MG tablet  amLODipine  (NORVASC ) 10 MG tablet   2. Would you like to learn more about the convenience, safety, & potential cost savings by using the [facility]?    3. Are you open to using the [facility] (Type [facility]. ).   4. Which pharmacy/location (including street and city if local pharmacy) is medication to be sent to?  [id number] - [place], [place] - [address] AT [facility] OF [address]     5. Do they need a 30 day or 90 day supply? 90 day

## 2024-11-04 NOTE — Telephone Encounter (Signed)
 Pharmacy Patient Advocate Encounter   Received notification from Onbase that prior authorization for Pantoprazole  Sodium 40MG  dr tablets  is required/requested.   Insurance verification completed.   The patient is insured through [facility].   Per test claim: PA required; PA submitted to above mentioned insurance via Latent Key/confirmation #/EOC [id number] Status is pending

## 2024-11-05 ENCOUNTER — Other Ambulatory Visit (HOSPITAL_COMMUNITY): Payer: Self-pay

## 2024-11-05 NOTE — Telephone Encounter (Signed)
 Pharmacy Patient Advocate Encounter  Received notification from [facility] that Prior Authorization for Pantoprazole  Sodium 40MG  dr tablets   has been APPROVED from 09/26/2024 to 11/05/2025   PA #/Case ID/Reference #: [id number]  PLEASE BE ADVISE OUTCOME FROM PLAN The authorization is valid from 10/06/2024 through 11/05/2025. A letter of explanation will also be mailed to the patient.

## 2024-11-05 NOTE — Telephone Encounter (Signed)
 Pt has been notified, PA has been approved.

## 2024-11-07 ENCOUNTER — Other Ambulatory Visit (HOSPITAL_COMMUNITY): Payer: Self-pay

## 2024-11-12 DIAGNOSIS — G4733 Obstructive sleep apnea (adult) (pediatric): Secondary | ICD-10-CM | POA: Diagnosis not present

## 2024-11-19 ENCOUNTER — Other Ambulatory Visit: Payer: Self-pay

## 2024-11-19 NOTE — Patient Outreach (Unsigned)
 Complex Care Management   Visit Note  11/19/2024  Name:  Katherine Browning MRN: [id number] DOB: Jul 13, 1966  Situation: Referral received for Complex Care Management related to SDOH Barriers:  Transportation Financial Resource Strain I obtained verbal consent from Patient.  Visit completed with Patient  on the phone  RNCM returned call to patient today. Patient began initial assessment. However, patient states that she is unable to complete assessment as she is at work. RNCM offered to schedule another date/time. However, patient declined and states she will call RNCM to schedule another date/time.  Background:   Past Medical History:  Diagnosis Date   Back pain    Hypertension    Knee pain     Assessment: Patient Reported Symptoms:  Cognitive Cognitive Status: Alert and oriented to person, place, and time Cognitive/Intellectual Conditions Management [RPT]: None reported or documented in medical history or problem list      Neurological Neurological Review of Symptoms: Not assessed (patient unable to complete assessment)    HEENT HEENT Symptoms Reported: Not assessed (patient unable to complete assessment)      Cardiovascular Cardiovascular Symptoms Reported: Not assessed (patient unable to complete assessment)    Respiratory Respiratory Symptoms Reported: Other: (patient unable to complete assessment)    Endocrine Endocrine Symptoms Reported: Not assessed (patient unable to complete assessment)    Gastrointestinal Gastrointestinal Symptoms Reported: Not assessed (patient unable to complete assessment)      Genitourinary Genitourinary Symptoms Reported: Not assessed (patient unable to complete assessment)    Integumentary Integumentary Symptoms Reported: Not assessed (patient unable to complete assessment)    Musculoskeletal Musculoskelatal Symptoms Reviewed: Not assessed (patient unable to complete assessment)        Psychosocial Psychosocial Symptoms Reported: Not  assessed (patient unable to complete assessment)          There were no vitals filed for this visit.    Medications Reviewed Today     Reviewed by Koehn Salehi M, RN (Registered Nurse) on 11/19/24 at 1146  Med List Status: <None>   Medication Order Taking? Sig Documenting Provider Last Dose Status Informant  allopurinol  (ZYLOPRIM ) 300 MG tablet [id number] Yes Take 1 tablet (300 mg total) by mouth daily. Rollene Almarie LABOR, MD  Active   amLODipine  (NORVASC ) 10 MG tablet [id number] Yes Take 1 tablet (10 mg total) by mouth daily. Azobou Tonleu, Franck H, MD  Active   colchicine  0.6 MG tablet [id number] Yes TAKE 1 TABLET(0.6 MG) BY MOUTH DAILY AS NEEDED Rollene Almarie LABOR, MD  Active Self, Pharmacy Records  cyclobenzaprine  (FLEXERIL ) 10 MG tablet [id number] Yes Take 1 tablet (10 mg total) by mouth 2 (two) times daily. Rollene Almarie LABOR, MD  Active Self, Pharmacy Records  hydrochlorothiazide  (HYDRODIURIL ) 25 MG tablet [id number] Yes Take 1 tablet (25 mg total) by mouth daily. Rollene Almarie LABOR, MD  Active Self, Pharmacy Records  Naltrexone-buPROPion HCl ER (CONTRAVE ) 8-90 MG GLENA [id number]  Start 1 tablet every morning for 7 days, then 1 tablet twice daily for 7 days, then 2 tablets every morning and one every evening  Patient not taking: Reported on 11/19/2024   Rollene Almarie LABOR, MD  Active   pantoprazole  (PROTONIX ) 40 MG tablet [id number] Yes Take 1 tablet (40 mg total) by mouth daily. Rollene Almarie LABOR, MD  Active   rosuvastatin  (CRESTOR ) 20 MG tablet [id number] Yes Take 1 tablet (20 mg total) by mouth daily. Azobou Tonleu, Joelle DEL, MD  Active           Recommendation:  Patient states she will call RNCM to complete assessment.  Follow Up Plan:   Per patient, she cannot reschedule a telephone visit at this time and states that she will have to call RNCM back to reschedule. RNCM confirmed patient has RNCM's contact number.  Heddy Shutter, RN, MSN, BSN, CCM [facility]  [facility], Population Health Case Manager Phone: [phone]

## 2024-11-19 NOTE — Patient Instructions (Signed)
 Olam Pouch - I am sorry I was unable to reach you today for our scheduled appointment. I work with Rollene Almarie LABOR, MD and am calling to support your healthcare needs. Please contact me at [phone] at your earliest convenience. I look forward to speaking with you soon.   Thank you,   Heddy Shutter, RN, MSN, BSN, CCM [place]  [facility], Population Health Case Manager Phone: [phone]

## 2024-11-26 ENCOUNTER — Other Ambulatory Visit (HOSPITAL_COMMUNITY): Payer: Self-pay

## 2024-11-26 ENCOUNTER — Encounter

## 2024-11-27 ENCOUNTER — Encounter: Admitting: Skilled Nursing Facility1

## 2024-11-28 ENCOUNTER — Other Ambulatory Visit (HOSPITAL_COMMUNITY): Payer: Self-pay

## 2024-11-28 NOTE — Telephone Encounter (Signed)
 LVM for pt to call office to schedule a visit.

## 2024-11-29 ENCOUNTER — Telehealth: Payer: Self-pay

## 2024-11-29 NOTE — Patient Instructions (Signed)
 Olam Pouch - I am sorry I was unable to reach you today for our scheduled appointment. I work with Rollene Almarie LABOR, MD and am calling to support your healthcare needs. Please contact me at [phone] at your earliest convenience. I look forward to speaking with you soon.   Thank you,   Heddy Shutter, RN, MSN, BSN, CCM [place]  [facility], Population Health Case Manager Phone: [phone]

## 2024-12-03 ENCOUNTER — Telehealth: Payer: Self-pay | Admitting: *Deleted

## 2024-12-03 NOTE — Progress Notes (Signed)
 Complex Care Management Care Guide Note  12/03/2024 Name: Katherine Browning MRN: [id number] DOB: September 07, 1966  [name] is a 59 y.o. year old female who is a primary care patient of Rollene Almarie LABOR, MD and is actively engaged with the care management team. I reached out to Olam Pouch by phone today to assist with re-scheduling  with the RN Case Manager.  Follow up plan: pt declined to reschedule at this time   Thedford Franks, CMA, AAMA [place]  [facility], [facility] Guide, Lead Direct Dial: [phone]  Fax: [phone]

## 2024-12-18 ENCOUNTER — Other Ambulatory Visit: Payer: Self-pay | Admitting: Internal Medicine
